# Patient Record
Sex: Female | Born: 1995 | Race: Black or African American | Hispanic: No | Smoking: Former smoker
Health system: Southern US, Community
[De-identification: ages and names within clinical notes are randomized; demographics above are authoritative.]

## PROBLEM LIST (undated history)

## (undated) ENCOUNTER — Inpatient Hospital Stay (HOSPITAL_COMMUNITY): Payer: Self-pay

## (undated) DIAGNOSIS — G4700 Insomnia, unspecified: Secondary | ICD-10-CM

## (undated) DIAGNOSIS — I499 Cardiac arrhythmia, unspecified: Secondary | ICD-10-CM

## (undated) DIAGNOSIS — J45909 Unspecified asthma, uncomplicated: Secondary | ICD-10-CM

## (undated) DIAGNOSIS — J302 Other seasonal allergic rhinitis: Secondary | ICD-10-CM

## (undated) DIAGNOSIS — F988 Other specified behavioral and emotional disorders with onset usually occurring in childhood and adolescence: Secondary | ICD-10-CM

## (undated) HISTORY — DX: Other specified behavioral and emotional disorders with onset usually occurring in childhood and adolescence: F98.8

## (undated) HISTORY — PX: TOOTH EXTRACTION: SUR596

## (undated) HISTORY — DX: Insomnia, unspecified: G47.00

## (undated) HISTORY — DX: Unspecified asthma, uncomplicated: J45.909

## (undated) HISTORY — DX: Other seasonal allergic rhinitis: J30.2

---
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
## 2014-03-17 ENCOUNTER — Inpatient Hospital Stay (HOSPITAL_COMMUNITY)
Admission: AD | Admit: 2014-03-17 | Discharge: 2014-03-17 | Disposition: A | Discharge status: Home / Self Care | Payer: Medicaid Other | Source: Ambulatory Visit | Attending: Obstetrics and Gynecology | Admitting: Obstetrics and Gynecology

## 2014-03-17 ENCOUNTER — Encounter (HOSPITAL_COMMUNITY): Payer: Self-pay | Admitting: *Deleted

## 2014-03-17 DIAGNOSIS — Z3201 Encounter for pregnancy test, result positive: Secondary | ICD-10-CM

## 2014-03-17 DIAGNOSIS — Z3200 Encounter for pregnancy test, result unknown: Secondary | ICD-10-CM | POA: Diagnosis present

## 2014-03-17 DIAGNOSIS — N926 Irregular menstruation, unspecified: Secondary | ICD-10-CM | POA: Insufficient documentation

## 2014-03-17 LAB — URINALYSIS, ROUTINE W REFLEX MICROSCOPIC
Bilirubin Urine: NEGATIVE
Glucose, UA: NEGATIVE mg/dL
Hgb urine dipstick: NEGATIVE
KETONES UR: NEGATIVE mg/dL
LEUKOCYTES UA: NEGATIVE
NITRITE: NEGATIVE
PH: 7 (ref 5.0–8.0)
PROTEIN: NEGATIVE mg/dL
Specific Gravity, Urine: 1.015 (ref 1.005–1.030)
UROBILINOGEN UA: 0.2 mg/dL (ref 0.0–1.0)

## 2014-03-17 LAB — POCT PREGNANCY, URINE: Preg Test, Ur: POSITIVE — AB

## 2014-03-17 NOTE — MAU Note (Signed)
Pt gave permission to discuss results and med  Hx in front of visitor

## 2014-03-17 NOTE — Discharge Instructions (Signed)
First Trimester of Pregnancy The first trimester of pregnancy is from week 1 until the end of week 12 (months 1 through 3). A week after a sperm fertilizes an egg, the egg will implant on the wall of the uterus. This embryo will begin to develop into a baby. Genes from you and your partner are forming the baby. The female genes determine whether the baby is a boy or a girl. At 6-8 weeks, the eyes and face are formed, and the heartbeat can be seen on ultrasound. At the end of 12 weeks, all the baby's organs are formed.  Now that you are pregnant, you will want to do everything you can to have a healthy baby. Two of the most important things are to get good prenatal care and to follow your health care provider's instructions. Prenatal care is all the medical care you receive before the baby's birth. This care will help prevent, find, and treat any problems during the pregnancy and childbirth. BODY CHANGES Your body goes through many changes during pregnancy. The changes vary from woman to woman.   You may gain or lose a couple of pounds at first.  You may feel sick to your stomach (nauseous) and throw up (vomit). If the vomiting is uncontrollable, call your health care provider.  You may tire easily.  You may develop headaches that can be relieved by medicines approved by your health care provider.  You may urinate more often. Painful urination may mean you have a bladder infection.  You may develop heartburn as a result of your pregnancy.  You may develop constipation because certain hormones are causing the muscles that push waste through your intestines to slow down.  You may develop hemorrhoids or swollen, bulging veins (varicose veins).  Your breasts may begin to grow larger and become tender. Your nipples may stick out more, and the tissue that surrounds them (areola) may become darker.  Your gums may bleed and may be sensitive to brushing and flossing.  Dark spots or blotches (chloasma,  mask of pregnancy) may develop on your face. This will likely fade after the baby is born.  Your menstrual periods will stop.  You may have a loss of appetite.  You may develop cravings for certain kinds of food.  You may have changes in your emotions from day to day, such as being excited to be pregnant or being concerned that something may go wrong with the pregnancy and baby.  You may have more vivid and strange dreams.  You may have changes in your hair. These can include thickening of your hair, rapid growth, and changes in texture. Some women also have hair loss during or after pregnancy, or hair that feels dry or thin. Your hair will most likely return to normal after your baby is born. WHAT TO EXPECT AT YOUR PRENATAL VISITS During a routine prenatal visit:  You will be weighed to make sure you and the baby are growing normally.  Your blood pressure will be taken.  Your abdomen will be measured to track your baby's growth.  The fetal heartbeat will be listened to starting around week 10 or 12 of your pregnancy.  Test results from any previous visits will be discussed. Your health care provider may ask you:  How you are feeling.  If you are feeling the baby move.  If you have had any abnormal symptoms, such as leaking fluid, bleeding, severe headaches, or abdominal cramping.  If you have any questions. Other tests   that may be performed during your first trimester include:  Blood tests to find your blood type and to check for the presence of any previous infections. They will also be used to check for low iron levels (anemia) and Rh antibodies. Later in the pregnancy, blood tests for diabetes will be done along with other tests if problems develop.  Urine tests to check for infections, diabetes, or protein in the urine.  An ultrasound to confirm the proper growth and development of the baby.  An amniocentesis to check for possible genetic problems.  Fetal screens for  spina bifida and Down syndrome.  You may need other tests to make sure you and the baby are doing well. HOME CARE INSTRUCTIONS  Medicines  Follow your health care provider's instructions regarding medicine use. Specific medicines may be either safe or unsafe to take during pregnancy.  Take your prenatal vitamins as directed.  If you develop constipation, try taking a stool softener if your health care provider approves. Diet  Eat regular, well-balanced meals. Choose a variety of foods, such as meat or vegetable-based protein, fish, milk and low-fat dairy products, vegetables, fruits, and whole grain breads and cereals. Your health care provider will help you determine the amount of weight gain that is right for you.  Avoid raw meat and uncooked cheese. These carry germs that can cause birth defects in the baby.  Eating four or five small meals rather than three large meals a day may help relieve nausea and vomiting. If you start to feel nauseous, eating a few soda crackers can be helpful. Drinking liquids between meals instead of during meals also seems to help nausea and vomiting.  If you develop constipation, eat more high-fiber foods, such as fresh vegetables or fruit and whole grains. Drink enough fluids to keep your urine clear or pale yellow. Activity and Exercise  Exercise only as directed by your health care provider. Exercising will help you:  Control your weight.  Stay in shape.  Be prepared for labor and delivery.  Experiencing pain or cramping in the lower abdomen or low back is a good sign that you should stop exercising. Check with your health care provider before continuing normal exercises.  Try to avoid standing for long periods of time. Move your legs often if you must stand in one place for a long time.  Avoid heavy lifting.  Wear low-heeled shoes, and practice good posture.  You may continue to have sex unless your health care provider directs you  otherwise. Relief of Pain or Discomfort  Wear a good support bra for breast tenderness.   Take warm sitz baths to soothe any pain or discomfort caused by hemorrhoids. Use hemorrhoid cream if your health care provider approves.   Rest with your legs elevated if you have leg cramps or low back pain.  If you develop varicose veins in your legs, wear support hose. Elevate your feet for 15 minutes, 3-4 times a day. Limit salt in your diet. Prenatal Care  Schedule your prenatal visits by the twelfth week of pregnancy. They are usually scheduled monthly at first, then more often in the last 2 months before delivery.  Write down your questions. Take them to your prenatal visits.  Keep all your prenatal visits as directed by your health care provider. Safety  Wear your seat belt at all times when driving.  Make a list of emergency phone numbers, including numbers for family, friends, the hospital, and police and fire departments. General Tips    Ask your health care provider for a referral to a local prenatal education class. Begin classes no later than at the beginning of month 6 of your pregnancy.  Ask for help if you have counseling or nutritional needs during pregnancy. Your health care provider can offer advice or refer you to specialists for help with various needs.  Do not use hot tubs, steam rooms, or saunas.  Do not douche or use tampons or scented sanitary pads.  Do not cross your legs for long periods of time.  Avoid cat litter boxes and soil used by cats. These carry germs that can cause birth defects in the baby and possibly loss of the fetus by miscarriage or stillbirth.  Avoid all smoking, herbs, alcohol, and medicines not prescribed by your health care provider. Chemicals in these affect the formation and growth of the baby.  Schedule a dentist appointment. At home, brush your teeth with a soft toothbrush and be gentle when you floss. SEEK MEDICAL CARE IF:   You have  dizziness.  You have mild pelvic cramps, pelvic pressure, or nagging pain in the abdominal area.  You have persistent nausea, vomiting, or diarrhea.  You have a bad smelling vaginal discharge.  You have pain with urination.  You notice increased swelling in your face, hands, legs, or ankles. SEEK IMMEDIATE MEDICAL CARE IF:   You have a fever.  You are leaking fluid from your vagina.  You have spotting or bleeding from your vagina.  You have severe abdominal cramping or pain.  You have rapid weight gain or loss.  You vomit blood or material that looks like coffee grounds.  You are exposed to German measles and have never had them.  You are exposed to fifth disease or chickenpox.  You develop a severe headache.  You have shortness of breath.  You have any kind of trauma, such as from a fall or a car accident. Document Released: 05/30/2001 Document Revised: 10/20/2013 Document Reviewed: 04/15/2013 [employer] Patient Information 2015 [employer]. This information is not intended to replace advice given to you by your health care provider. Make sure you discuss any questions you have with your health care provider.  

## 2014-03-17 NOTE — MAU Note (Signed)
Came to see if I was ok,  To see if pregnant.  Has had irregular periods, home test was "blank"

## 2014-03-17 NOTE — MAU Provider Note (Signed)
History     CSN: [id number]  Arrival date and time: 03/17/14 1224   First Provider Initiated Contact with Patient 03/17/14 1334      Chief Complaint  Patient presents with  . Possible Pregnancy   Possible Pregnancy   Nicole Dillon is a 18 y.o. G1P0 at Unknown who presents today for a pregnancy test. She states that she has irregular periods, and she tried to take a test at home. However, that test came up "blank". She denies any abdominal pain or vaginal bleeding.   No past medical history on file.  No past surgical history on file.  No family history on file.  History  Substance Use Topics  . Smoking status: Never Smoker   . Smokeless tobacco: Not on file  . Alcohol Use: No    Allergies: No Known Allergies  Prescriptions prior to admission  Medication Sig Dispense Refill  . cephALEXin (KEFLEX) 500 MG capsule Take 1 capsule (500 mg total) by mouth 2 (two) times daily.  20 capsule  0  . HYDROcodone-acetaminophen (NORCO/VICODIN) 5-325 MG per tablet Take 1 tablet by mouth every 4 (four) hours as needed for pain.  15 tablet  0  . ibuprofen (ADVIL,MOTRIN) 800 MG tablet Take 1 tablet (800 mg total) by mouth every 8 (eight) hours as needed for mild pain or moderate pain.  30 tablet  0    ROS Physical Exam   Blood pressure 125/77, pulse 112, temperature 98.7 F (37.1 C), temperature source Oral, resp. rate 18, height 5' 4.25" (1.632 m), weight 112.038 kg (247 lb), last menstrual period 01/31/2014, SpO2 100.00%.  Physical Exam  Nursing note and vitals reviewed. Constitutional: She is oriented to person, place, and time. She appears well-developed and well-nourished. No distress.  Cardiovascular: Normal rate.   Respiratory: Effort normal.  GI: Soft. There is no tenderness. There is no rebound.  Neurological: She is alert and oriented to person, place, and time.  Skin: Skin is warm and dry.  Psychiatric: She has a normal mood and affect.    MAU Course   Procedures  Results for orders placed during the hospital encounter of 03/17/14 (from the past 24 hour(s))  URINALYSIS, ROUTINE W REFLEX MICROSCOPIC     Status: Abnormal   Collection Time    03/17/14 12:33 PM      Result Value Ref Range   Color, Urine YELLOW  YELLOW   APPearance HAZY (*) CLEAR   Specific Gravity, Urine 1.015  1.005 - 1.030   pH 7.0  5.0 - 8.0   Glucose, UA NEGATIVE  NEGATIVE mg/dL   Hgb urine dipstick NEGATIVE  NEGATIVE   Bilirubin Urine NEGATIVE  NEGATIVE   Ketones, ur NEGATIVE  NEGATIVE mg/dL   Protein, ur NEGATIVE  NEGATIVE mg/dL   Urobilinogen, UA 0.2  0.0 - 1.0 mg/dL   Nitrite NEGATIVE  NEGATIVE   Leukocytes, UA NEGATIVE  NEGATIVE  POCT PREGNANCY, URINE     Status: Abnormal   Collection Time    03/17/14 12:44 PM      Result Value Ref Range   Preg Test, Ur POSITIVE (*) NEGATIVE     Assessment and Plan   1. Encounter for pregnancy test, result positive    First trimester precautions reviewed Return to MAU as needed [facility] as soon as possible  Follow-up Information   Follow up with [facility]. (Call for an appointment )    Specialty:  Obstetrics and Gynecology   Contact information:   [phone] [place] [id number] [phone]       Tawnya CrookHogan, Heather Donovan 03/17/2014, 1:36 PM

## 2014-03-22 NOTE — MAU Provider Note (Signed)
Attestation of Attending Supervision of Advanced Practitioner (CNM/NP): Evaluation and management procedures were performed by the Advanced Practitioner under my supervision and collaboration.  I have reviewed the Advanced Practitioner's note and chart, and I agree with the management and plan.  Hazem Kenner 03/22/2014 10:21 AM

## 2014-04-01 ENCOUNTER — Encounter: Payer: Self-pay | Admitting: Advanced Practice Midwife

## 2014-04-01 ENCOUNTER — Other Ambulatory Visit: Payer: Self-pay | Admitting: Advanced Practice Midwife

## 2014-04-01 ENCOUNTER — Encounter: Payer: Self-pay | Admitting: *Deleted

## 2014-04-01 ENCOUNTER — Ambulatory Visit (INDEPENDENT_AMBULATORY_CARE_PROVIDER_SITE_OTHER): Payer: Medicaid Other | Admitting: Advanced Practice Midwife

## 2014-04-01 ENCOUNTER — Other Ambulatory Visit: Payer: Self-pay | Admitting: Obstetrics & Gynecology

## 2014-04-01 VITALS — BP 109/77 | HR 94 | Wt 248.4 lb

## 2014-04-01 DIAGNOSIS — Z3682 Encounter for antenatal screening for nuchal translucency: Secondary | ICD-10-CM

## 2014-04-01 DIAGNOSIS — Z23 Encounter for immunization: Secondary | ICD-10-CM

## 2014-04-01 DIAGNOSIS — Z3401 Encounter for supervision of normal first pregnancy, first trimester: Secondary | ICD-10-CM

## 2014-04-01 DIAGNOSIS — Z3490 Encounter for supervision of normal pregnancy, unspecified, unspecified trimester: Secondary | ICD-10-CM | POA: Insufficient documentation

## 2014-04-01 DIAGNOSIS — O99211 Obesity complicating pregnancy, first trimester: Secondary | ICD-10-CM

## 2014-04-01 DIAGNOSIS — E669 Obesity, unspecified: Secondary | ICD-10-CM

## 2014-04-01 LAB — OB RESULTS CONSOLE GC/CHLAMYDIA
Chlamydia: NEGATIVE
Gonorrhea: NEGATIVE

## 2014-04-01 MED ORDER — NATACHEW 28-1 MG PO CHEW: 1.0000 | CHEWABLE_TABLET | Freq: Every day | ORAL | Status: DC

## 2014-04-01 MED ORDER — PROMETHAZINE HCL 25 MG PO TABS: 25.0000 mg | ORAL_TABLET | Freq: Four times a day (QID) | ORAL | Status: DC | PRN

## 2014-04-01 NOTE — Progress Notes (Signed)
New OB.  See Smart Set note.  Subjective:    Nicole Dillon is a G1P0 2719w4d being seen today for her first obstetrical visit.  Her obstetrical history is significant for obesity. Patient does intend to breast feed. Pregnancy history fully reviewed.  Patient reports nausea.  Filed Vitals:   04/01/14 1340  BP: 109/77  Pulse: 94  Weight: 248 lb 6.4 oz (112.674 kg)    HISTORY: OB History  Gravida Para Term Preterm AB SAB TAB Ectopic Multiple Living  1             # Outcome Date GA Lbr Len/2nd Weight Sex Delivery Anes PTL Lv  1 CUR              Past Medical History  Diagnosis Date  . 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activity   . Asthma   . Seasonal allergies   . Insomnia    History reviewed. No pertinent past surgical history. Family History  Problem Relation Age of Onset  . Diabetes Mother   . Diabetes Father   . Kidney disease Father     dialysis  . Learning disabilities Maternal Uncle     speach impediment  . Heart disease Maternal Grandmother   . Heart disease Maternal Grandfather      Exam    Uterus:     Pelvic Exam:    Perineum: No Hemorrhoids, Normal Perineum   Vulva: Bartholin's, Urethra, Skene's normal   Vagina:  normal mucosa, normal discharge   pH:    Cervix: no lesions and nulliparous appearance   Adnexa: normal adnexa and no mass, fullness, tenderness   Bony Pelvis: gynecoid  System: Breast:  normal appearance, no masses or tenderness   Skin: normal coloration and turgor, no rashes    Neurologic: oriented, normal, grossly non-focal   Extremities: normal strength, tone, and muscle mass   HEENT neck supple with midline trachea   Mouth/Teeth mucous membranes moist, pharynx normal without lesions   Neck supple and no masses   Cardiovascular: regular rate and rhythm, no murmurs or gallops   Respiratory:  appears well, vitals normal, no respiratory distress, acyanotic, normal RR, ear and throat exam is normal, neck free of mass or  lymphadenopathy, chest clear, no wheezing, crepitations, rhonchi, normal symmetric air entry   Abdomen: soft, non-tender; bowel sounds normal; no masses,  no organomegaly   Urinary: urethral meatus normal      Assessment:    Pregnancy: G1P0 There are no active problems to display for this patient.       Plan:     Initial labs drawn. Prenatal vitamins. Problem list reviewed and updated. Genetic Screening discussed First Screen: requested.  Ultrasound discussed; fetal survey: requested.  Follow up in 4 weeks. 50% of 30 min visit spent on counseling and coordination of care.   Discussed first trimester screen, does want to do it Mother states she had an amniocentesis because a cousin has a "learning disability"   [facility] HospitalWILLIAMS,Colinda Barth 04/01/2014

## 2014-04-01 NOTE — Patient Instructions (Signed)
AFP Maternal This is a routine screen (tests) used to check for fetal abnormalities such as Down syndrome and neural tube defects. Down syndrome is a chromosomal abnormality, sometimes called Trisomy 21. Neural tube defects are serious birth defects. The brain, spinal cord, or their coverings do not develop completely. Women should be tested in the 15th to 20th week of pregnancy. The msAFP screen involves three or four tests that measure substances found in the blood that make the testing better. During development, AFP levels in fetal blood and amniotic fluid rise until about 12 weeks. The levels then gradually fall until birth. AFP is a protein produce by fetal tissue. AFP crosses the placenta and appears in the maternal blood. A baby with an open neural tube defect has an opening in its spine, head, or abdominal wall that allows higher than usual amounts of AFP to pass into the mother's blood. If a screen is positive, more tests are needed to make a diagnosis. These include ultrasound and perhaps amniocentesis (checking the fluid that surrounds the baby). These tests are used to help women and their caregivers make decisions about the management of their pregnancies. In pregnancies where the fetus is carrying the chromosomal defect that results in Down syndrome, the levels of AFP and unconjugated estriol tend to be low and hCG and inhibin A levels high.  PREPARATION FOR TEST Blood is drawn from a vein in your arm usually between the 15th and 20th weeks of pregnancy. Four different tests on your blood are done. These are AFP, hCG, unconjugated estriol, and inhibin A. The combination of tests produces a more accurate result. NORMAL FINDINGS   Adult: less than 40 ng/mL or less than 40 mg/L (SI units).  Child younger than 1 year: less than 30 ng/mL. Ranges are stratified by weeks of gestation and vary among laboratories. Ranges for normal findings may vary among different laboratories and hospitals. You  should always check with your doctor after having lab work or other tests done to discuss the meaning of your test results and whether your values are considered within normal limits. MEANING OF TEST  These are screening tests. Not all fetal abnormalities will give positive test results. Of all women who have positive AFP screening results, only a very small number of them have babies who actually have a neural tube defect or chromosomal abnormality. Your caregiver will go over the test results with you and discuss the importance and meaning of your results, as well as treatment options and the need for additional tests if necessary. OBTAINING THE TEST RESULTS It is your responsibility to obtain your test results. Ask the lab or department performing the test when and how you will get your results. Document Released: 06/27/2004 Document Revised: 10/20/2013 Document Reviewed: 05/09/2008 ExitCare Patient Information 2015 [employer]. This information is not intended to replace advice given to you by your health care provider. Make sure you discuss any questions you have with your health care provider. First Trimester of Pregnancy The first trimester of pregnancy is from week 1 until the end of week 12 (months 1 through 3). A week after a sperm fertilizes an egg, the egg will implant on the wall of the uterus. This embryo will begin to develop into a baby. Genes from you and your partner are forming the baby. The female genes determine whether the baby is a boy or a girl. At 6-8 weeks, the eyes and face are formed, and the heartbeat can be seen on ultrasound.   At the end of 12 weeks, all the baby's organs are formed.  Now that you are pregnant, you will want to do everything you can to have a healthy baby. Two of the most important things are to get good prenatal care and to follow your health care provider's instructions. Prenatal care is all the medical care you receive before the baby's birth. This care  will help prevent, find, and treat any problems during the pregnancy and childbirth. BODY CHANGES Your body goes through many changes during pregnancy. The changes vary from woman to woman.   You may gain or lose a couple of pounds at first.  You may feel sick to your stomach (nauseous) and throw up (vomit). If the vomiting is uncontrollable, call your health care provider.  You may tire easily.  You may develop headaches that can be relieved by medicines approved by your health care provider.  You may urinate more often. Painful urination may mean you have a bladder infection.  You may develop heartburn as a result of your pregnancy.  You may develop constipation because certain hormones are causing the muscles that push waste through your intestines to slow down.  You may develop hemorrhoids or swollen, bulging veins (varicose veins).  Your breasts may begin to grow larger and become tender. Your nipples may stick out more, and the tissue that surrounds them (areola) may become darker.  Your gums may bleed and may be sensitive to brushing and flossing.  Dark spots or blotches (chloasma, mask of pregnancy) may develop on your face. This will likely fade after the baby is born.  Your menstrual periods will stop.  You may have a loss of appetite.  You may develop cravings for certain kinds of food.  You may have changes in your emotions from day to day, such as being excited to be pregnant or being concerned that something may go wrong with the pregnancy and baby.  You may have more vivid and strange dreams.  You may have changes in your hair. These can include thickening of your hair, rapid growth, and changes in texture. Some women also have hair loss during or after pregnancy, or hair that feels dry or thin. Your hair will most likely return to normal after your baby is born. WHAT TO EXPECT AT YOUR PRENATAL VISITS During a routine prenatal visit:  You will be weighed to make  sure you and the baby are growing normally.  Your blood pressure will be taken.  Your abdomen will be measured to track your baby's growth.  The fetal heartbeat will be listened to starting around week 10 or 12 of your pregnancy.  Test results from any previous visits will be discussed. Your health care provider may ask you:  How you are feeling.  If you are feeling the baby move.  If you have had any abnormal symptoms, such as leaking fluid, bleeding, severe headaches, or abdominal cramping.  If you have any questions. Other tests that may be performed during your first trimester include:  Blood tests to find your blood type and to check for the presence of any previous infections. They will also be used to check for low iron levels (anemia) and Rh antibodies. Later in the pregnancy, blood tests for diabetes will be done along with other tests if problems develop.  Urine tests to check for infections, diabetes, or protein in the urine.  An ultrasound to confirm the proper growth and development of the baby.  An amniocentesis   to check for possible genetic problems.  Fetal screens for spina bifida and Down syndrome.  You may need other tests to make sure you and the baby are doing well. HOME CARE INSTRUCTIONS  Medicines  Follow your health care provider's instructions regarding medicine use. Specific medicines may be either safe or unsafe to take during pregnancy.  Take your prenatal vitamins as directed.  If you develop constipation, try taking a stool softener if your health care provider approves. Diet  Eat regular, well-balanced meals. Choose a variety of foods, such as meat or vegetable-based protein, fish, milk and low-fat dairy products, vegetables, fruits, and whole grain breads and cereals. Your health care provider will help you determine the amount of weight gain that is right for you.  Avoid raw meat and uncooked cheese. These carry germs that can cause birth  defects in the baby.  Eating four or five small meals rather than three large meals a day may help relieve nausea and vomiting. If you start to feel nauseous, eating a few soda crackers can be helpful. Drinking liquids between meals instead of during meals also seems to help nausea and vomiting.  If you develop constipation, eat more high-fiber foods, such as fresh vegetables or fruit and whole grains. Drink enough fluids to keep your urine clear or pale yellow. Activity and Exercise  Exercise only as directed by your health care provider. Exercising will help you:  Control your weight.  Stay in shape.  Be prepared for labor and delivery.  Experiencing pain or cramping in the lower abdomen or low back is a good sign that you should stop exercising. Check with your health care provider before continuing normal exercises.  Try to avoid standing for long periods of time. Move your legs often if you must stand in one place for a long time.  Avoid heavy lifting.  Wear low-heeled shoes, and practice good posture.  You may continue to have sex unless your health care provider directs you otherwise. Relief of Pain or Discomfort  Wear a good support bra for breast tenderness.   Take warm sitz baths to soothe any pain or discomfort caused by hemorrhoids. Use hemorrhoid cream if your health care provider approves.   Rest with your legs elevated if you have leg cramps or low back pain.  If you develop varicose veins in your legs, wear support hose. Elevate your feet for 15 minutes, 3-4 times a day. Limit salt in your diet. Prenatal Care  Schedule your prenatal visits by the twelfth week of pregnancy. They are usually scheduled monthly at first, then more often in the last 2 months before delivery.  Write down your questions. Take them to your prenatal visits.  Keep all your prenatal visits as directed by your health care provider. Safety  Wear your seat belt at all times when  driving.  Make a list of emergency phone numbers, including numbers for family, friends, the hospital, and police and fire departments. General Tips  Ask your health care provider for a referral to a local prenatal education class. Begin classes no later than at the beginning of month 6 of your pregnancy.  Ask for help if you have counseling or nutritional needs during pregnancy. Your health care provider can offer advice or refer you to specialists for help with various needs.  Do not use hot tubs, steam rooms, or saunas.  Do not douche or use tampons or scented sanitary pads.  Do not cross your legs for long periods   of time.  Avoid cat litter boxes and soil used by cats. These carry germs that can cause birth defects in the baby and possibly loss of the fetus by miscarriage or stillbirth.  Avoid all smoking, herbs, alcohol, and medicines not prescribed by your health care provider. Chemicals in these affect the formation and growth of the baby.  Schedule a dentist appointment. At home, brush your teeth with a soft toothbrush and be gentle when you floss. SEEK MEDICAL CARE IF:   You have dizziness.  You have mild pelvic cramps, pelvic pressure, or nagging pain in the abdominal area.  You have persistent nausea, vomiting, or diarrhea.  You have a bad smelling vaginal discharge.  You have pain with urination.  You notice increased swelling in your face, hands, legs, or ankles. SEEK IMMEDIATE MEDICAL CARE IF:   You have a fever.  You are leaking fluid from your vagina.  You have spotting or bleeding from your vagina.  You have severe abdominal cramping or pain.  You have rapid weight gain or loss.  You vomit blood or material that looks like coffee grounds.  You are exposed to German measles and have never had them.  You are exposed to fifth disease or chickenpox.  You develop a severe headache.  You have shortness of breath.  You have any kind of trauma, such as  from a fall or a car accident. Document Released: 05/30/2001 Document Revised: 10/20/2013 Document Reviewed: 04/15/2013 Exit[employer] Patient Information 2015 [employer]. This information is not intended to replace advice given to you by your health care provider. Make sure you discuss any questions you have with your health care provider.  

## 2014-04-02 LAB — OBSTETRIC PANEL
Antibody Screen: NEGATIVE
BASOS PCT: 0 % (ref 0–1)
Basophils Absolute: 0 10*3/uL (ref 0.0–0.1)
EOS ABS: 0.1 10*3/uL (ref 0.0–0.7)
EOS PCT: 1 % (ref 0–5)
HCT: 36.4 % (ref 36.0–46.0)
HEP B S AG: NEGATIVE
Hemoglobin: 12.2 g/dL (ref 12.0–15.0)
Lymphocytes Relative: 27 % (ref 12–46)
Lymphs Abs: 2.6 10*3/uL (ref 0.7–4.0)
MCH: 29.4 pg (ref 26.0–34.0)
MCHC: 33.5 g/dL (ref 30.0–36.0)
MCV: 87.7 fL (ref 78.0–100.0)
Monocytes Absolute: 0.5 10*3/uL (ref 0.1–1.0)
Monocytes Relative: 5 % (ref 3–12)
NEUTROS PCT: 67 % (ref 43–77)
Neutro Abs: 6.6 10*3/uL (ref 1.7–7.7)
PLATELETS: 360 10*3/uL (ref 150–400)
RBC: 4.15 MIL/uL (ref 3.87–5.11)
RDW: 13.9 % (ref 11.5–15.5)
Rh Type: NEGATIVE
Rubella: 4.15 Index — ABNORMAL HIGH (ref <0.90)
WBC: 9.8 10*3/uL (ref 4.0–10.5)

## 2014-04-02 LAB — HIV ANTIBODY (ROUTINE TESTING W REFLEX): HIV: NONREACTIVE

## 2014-04-02 LAB — GC/CHLAMYDIA PROBE AMP
CT PROBE, AMP APTIMA: NEGATIVE
GC PROBE AMP APTIMA: NEGATIVE

## 2014-04-02 NOTE — Addendum Note (Signed)
Addended by: Barbara CowerNOGUES, Madissen Wyse L on: 04/02/2014 08:43 AM   Modules accepted: Orders

## 2014-04-03 LAB — CULTURE, OB URINE: Colony Count: >=100000

## 2014-04-08 ENCOUNTER — Encounter: Payer: Self-pay | Admitting: Advanced Practice Midwife

## 2014-04-08 DIAGNOSIS — Z6791 Unspecified blood type, Rh negative: Secondary | ICD-10-CM | POA: Insufficient documentation

## 2014-04-08 HISTORY — DX: Unspecified blood type, rh negative: Z67.91

## 2014-04-10 ENCOUNTER — Other Ambulatory Visit: Payer: Self-pay | Admitting: Obstetrics & Gynecology

## 2014-04-10 DIAGNOSIS — Z3682 Encounter for antenatal screening for nuchal translucency: Secondary | ICD-10-CM

## 2014-04-20 ENCOUNTER — Encounter: Payer: Self-pay | Admitting: Advanced Practice Midwife

## 2014-04-29 ENCOUNTER — Ambulatory Visit (HOSPITAL_COMMUNITY)
Admission: RE | Admit: 2014-04-29 | Discharge: 2014-04-29 | Disposition: A | Discharge status: Home / Self Care | Payer: Medicaid Other | Source: Ambulatory Visit | Attending: Obstetrics & Gynecology | Admitting: Obstetrics & Gynecology

## 2014-04-29 ENCOUNTER — Ambulatory Visit (INDEPENDENT_AMBULATORY_CARE_PROVIDER_SITE_OTHER): Payer: Medicaid Other | Admitting: Obstetrics & Gynecology

## 2014-04-29 VITALS — BP 118/73 | HR 148 | Wt 246.0 lb

## 2014-04-29 DIAGNOSIS — J45909 Unspecified asthma, uncomplicated: Secondary | ICD-10-CM | POA: Diagnosis not present

## 2014-04-29 DIAGNOSIS — O99511 Diseases of the respiratory system complicating pregnancy, first trimester: Secondary | ICD-10-CM | POA: Insufficient documentation

## 2014-04-29 DIAGNOSIS — O360111 Maternal care for anti-D [Rh] antibodies, first trimester, fetus 1: Secondary | ICD-10-CM

## 2014-04-29 DIAGNOSIS — Z3A12 12 weeks gestation of pregnancy: Secondary | ICD-10-CM | POA: Diagnosis not present

## 2014-04-29 DIAGNOSIS — O360910 Maternal care for other rhesus isoimmunization, first trimester, not applicable or unspecified: Secondary | ICD-10-CM | POA: Insufficient documentation

## 2014-04-29 DIAGNOSIS — Z3401 Encounter for supervision of normal first pregnancy, first trimester: Secondary | ICD-10-CM

## 2014-04-29 DIAGNOSIS — E669 Obesity, unspecified: Secondary | ICD-10-CM

## 2014-04-29 DIAGNOSIS — O99211 Obesity complicating pregnancy, first trimester: Secondary | ICD-10-CM

## 2014-04-29 DIAGNOSIS — Z3682 Encounter for antenatal screening for nuchal translucency: Secondary | ICD-10-CM

## 2014-04-29 NOTE — Patient Instructions (Signed)
Return to clinic for any obstetric concerns or go to MAU for evaluation  

## 2014-04-29 NOTE — Progress Notes (Signed)
First trimester screen later today, will check AFP only lab draw next visit.  Anatomy scan ordered.  No other complaints or concerns.  Routine obstetric precautions reviewed.

## 2014-05-06 ENCOUNTER — Other Ambulatory Visit (HOSPITAL_COMMUNITY): Payer: Self-pay

## 2014-05-27 ENCOUNTER — Encounter: Payer: Self-pay | Admitting: Obstetrics & Gynecology

## 2014-05-27 ENCOUNTER — Ambulatory Visit (INDEPENDENT_AMBULATORY_CARE_PROVIDER_SITE_OTHER): Payer: Medicaid Other | Admitting: Obstetrics & Gynecology

## 2014-05-27 VITALS — BP 118/91 | HR 109 | Wt 249.0 lb

## 2014-05-27 DIAGNOSIS — Z3402 Encounter for supervision of normal first pregnancy, second trimester: Secondary | ICD-10-CM

## 2014-05-27 DIAGNOSIS — Z36 Encounter for antenatal screening of mother: Secondary | ICD-10-CM

## 2014-05-27 DIAGNOSIS — O99212 Obesity complicating pregnancy, second trimester: Secondary | ICD-10-CM

## 2014-05-27 NOTE — Addendum Note (Signed)
Addended by: Barbara CowerNOGUES, Ammi Hutt L on: 05/27/2014 02:10 PM   Modules accepted: Orders

## 2014-05-27 NOTE — Patient Instructions (Signed)
Second Trimester of Pregnancy The second trimester is from week 13 through week 28, months 4 through 6. The second trimester is often a time when you feel your best. Your body has also adjusted to being pregnant, and you begin to feel better physically. Usually, morning sickness has lessened or quit completely, you may have more energy, and you may have an increase in appetite. The second trimester is also a time when the fetus is growing rapidly. At the end of the sixth month, the fetus is about 9 inches long and weighs about 1 pounds. You will likely begin to feel the baby move (quickening) between 18 and 20 weeks of the pregnancy. BODY CHANGES Your body goes through many changes during pregnancy. The changes vary from woman to woman.   Your weight will continue to increase. You will notice your lower abdomen bulging out.  You may begin to get stretch marks on your hips, abdomen, and breasts.  You may develop headaches that can be relieved by medicines approved by your health care provider.  You may urinate more often because the fetus is pressing on your bladder.  You may develop or continue to have heartburn as a result of your pregnancy.  You may develop constipation because certain hormones are causing the muscles that push waste through your intestines to slow down.  You may develop hemorrhoids or swollen, bulging veins (varicose veins).  You may have back pain because of the weight gain and pregnancy hormones relaxing your joints between the bones in your pelvis and as a result of a shift in weight and the muscles that support your balance.  Your breasts will continue to grow and be tender.  Your gums may bleed and may be sensitive to brushing and flossing.  Dark spots or blotches (chloasma, mask of pregnancy) may develop on your face. This will likely fade after the baby is born.  A dark line from your belly button to the pubic area (linea nigra) may appear. This will likely fade  after the baby is born.  You may have changes in your hair. These can include thickening of your hair, rapid growth, and changes in texture. Some women also have hair loss during or after pregnancy, or hair that feels dry or thin. Your hair will most likely return to normal after your baby is born. WHAT TO EXPECT AT YOUR PRENATAL VISITS During a routine prenatal visit:  You will be weighed to make sure you and the fetus are growing normally.  Your blood pressure will be taken.  Your abdomen will be measured to track your baby's growth.  The fetal heartbeat will be listened to.  Any test results from the previous visit will be discussed. Your health care provider may ask you:  How you are feeling.  If you are feeling the baby move.  If you have had any abnormal symptoms, such as leaking fluid, bleeding, severe headaches, or abdominal cramping.  If you have any questions. Other tests that may be performed during your second trimester include:  Blood tests that check for:  Low iron levels (anemia).  Gestational diabetes (between 24 and 28 weeks).  Rh antibodies.  Urine tests to check for infections, diabetes, or protein in the urine.  An ultrasound to confirm the proper growth and development of the baby.  An amniocentesis to check for possible genetic problems.  Fetal screens for spina bifida and Down syndrome. HOME CARE INSTRUCTIONS   Avoid all smoking, herbs, alcohol, and unprescribed   drugs. These chemicals affect the formation and growth of the baby.  Follow your health care provider's instructions regarding medicine use. There are medicines that are either safe or unsafe to take during pregnancy.  Exercise only as directed by your health care provider. Experiencing uterine cramps is a good sign to stop exercising.  Continue to eat regular, healthy meals.  Wear a good support bra for breast tenderness.  Do not use hot tubs, steam rooms, or saunas.  Wear your  seat belt at all times when driving.  Avoid raw meat, uncooked cheese, cat litter boxes, and soil used by cats. These carry germs that can cause birth defects in the baby.  Take your prenatal vitamins.  Try taking a stool softener (if your health care provider approves) if you develop constipation. Eat more high-fiber foods, such as fresh vegetables or fruit and whole grains. Drink plenty of fluids to keep your urine clear or pale yellow.  Take warm sitz baths to soothe any pain or discomfort caused by hemorrhoids. Use hemorrhoid cream if your health care provider approves.  If you develop varicose veins, wear support hose. Elevate your feet for 15 minutes, 3-4 times a day. Limit salt in your diet.  Avoid heavy lifting, wear low heel shoes, and practice good posture.  Rest with your legs elevated if you have leg cramps or low back pain.  Visit your dentist if you have not gone yet during your pregnancy. Use a soft toothbrush to brush your teeth and be gentle when you floss.  A sexual relationship may be continued unless your health care provider directs you otherwise.  Continue to go to all your prenatal visits as directed by your health care provider. SEEK MEDICAL CARE IF:   You have dizziness.  You have mild pelvic cramps, pelvic pressure, or nagging pain in the abdominal area.  You have persistent nausea, vomiting, or diarrhea.  You have a bad smelling vaginal discharge.  You have pain with urination. SEEK IMMEDIATE MEDICAL CARE IF:   You have a fever.  You are leaking fluid from your vagina.  You have spotting or bleeding from your vagina.  You have severe abdominal cramping or pain.  You have rapid weight gain or loss.  You have shortness of breath with chest pain.  You notice sudden or extreme swelling of your face, hands, ankles, feet, or legs.  You have not felt your baby move in over an hour.  You have severe headaches that do not go away with  medicine.  You have vision changes. Document Released: 05/30/2001 Document Revised: 06/10/2013 Document Reviewed: 08/06/2012 [employer] Patient Information 2015 [employer]. This information is not intended to replace advice given to you by your health care provider. Make sure you discuss any questions you have with your health care provider.  

## 2014-05-27 NOTE — Progress Notes (Signed)
AFP today. No problems noted F/u 4 weeks or sooner prn

## 2014-06-02 LAB — ALPHA FETOPROTEIN, MATERNAL
AFP: 27.5 ng/mL
CURR GEST AGE: 16.4 wks.days
MOM FOR AFP: 0.96
Open Spina bifida: NEGATIVE

## 2014-06-17 ENCOUNTER — Other Ambulatory Visit: Payer: Self-pay | Admitting: Obstetrics & Gynecology

## 2014-06-17 ENCOUNTER — Ambulatory Visit (HOSPITAL_COMMUNITY)
Admission: RE | Admit: 2014-06-17 | Discharge: 2014-06-17 | Disposition: A | Discharge status: Home / Self Care | Payer: Medicaid Other | Source: Ambulatory Visit | Attending: Obstetrics & Gynecology | Admitting: Obstetrics & Gynecology

## 2014-06-17 DIAGNOSIS — Z36 Encounter for antenatal screening of mother: Secondary | ICD-10-CM | POA: Insufficient documentation

## 2014-06-17 DIAGNOSIS — O360111 Maternal care for anti-D [Rh] antibodies, first trimester, fetus 1: Secondary | ICD-10-CM

## 2014-06-17 DIAGNOSIS — O99211 Obesity complicating pregnancy, first trimester: Secondary | ICD-10-CM

## 2014-06-17 DIAGNOSIS — O360920 Maternal care for other rhesus isoimmunization, second trimester, not applicable or unspecified: Secondary | ICD-10-CM | POA: Diagnosis present

## 2014-06-17 DIAGNOSIS — Z3A19 19 weeks gestation of pregnancy: Secondary | ICD-10-CM | POA: Diagnosis not present

## 2014-06-17 DIAGNOSIS — J45909 Unspecified asthma, uncomplicated: Secondary | ICD-10-CM | POA: Diagnosis not present

## 2014-06-17 DIAGNOSIS — O99212 Obesity complicating pregnancy, second trimester: Secondary | ICD-10-CM | POA: Diagnosis not present

## 2014-06-17 DIAGNOSIS — Z3689 Encounter for other specified antenatal screening: Secondary | ICD-10-CM | POA: Insufficient documentation

## 2014-06-17 DIAGNOSIS — Z3400 Encounter for supervision of normal first pregnancy, unspecified trimester: Secondary | ICD-10-CM | POA: Insufficient documentation

## 2014-06-17 DIAGNOSIS — O99512 Diseases of the respiratory system complicating pregnancy, second trimester: Secondary | ICD-10-CM | POA: Insufficient documentation

## 2014-06-18 ENCOUNTER — Encounter: Payer: Self-pay | Admitting: Obstetrics & Gynecology

## 2014-06-18 DIAGNOSIS — Z6841 Body Mass Index (BMI) 40.0 and over, adult: Secondary | ICD-10-CM | POA: Insufficient documentation

## 2014-06-18 HISTORY — DX: Morbid (severe) obesity due to excess calories: E66.01

## 2014-06-19 NOTE — L&D Delivery Note (Signed)
Delivery Note At 10:08 AM a viable female was delivered via Vaginal, Spontaneous Delivery (Presentation:vertex ; Occiput Anterior).  APGAR: 9, 9; weight  .   Placenta status: Intact, Spontaneous.  Cord: 3 vessels with the following complications: None.  Cord pH: n/a  Anesthesia: None  Episiotomy: None Lacerations: 2nd degree;Perineal Suture Repair: 2.0 vicryl Est. Blood Loss 250(mL):    Mom to postpartum.  Baby to Couplet care / Skin to Skin.  Nicole Dillon, Nicole Dillon 11/08/2014, 10:23 AM

## 2014-06-20 ENCOUNTER — Encounter (HOSPITAL_COMMUNITY): Payer: Self-pay

## 2014-06-20 ENCOUNTER — Inpatient Hospital Stay (HOSPITAL_COMMUNITY)
Admission: AD | Admit: 2014-06-20 | Discharge: 2014-06-20 | Disposition: A | Discharge status: Home / Self Care | Payer: Medicaid Other | Source: Ambulatory Visit | Attending: Obstetrics and Gynecology | Admitting: Obstetrics and Gynecology

## 2014-06-20 DIAGNOSIS — Z3A20 20 weeks gestation of pregnancy: Secondary | ICD-10-CM | POA: Diagnosis not present

## 2014-06-20 DIAGNOSIS — Z87891 Personal history of nicotine dependence: Secondary | ICD-10-CM | POA: Diagnosis not present

## 2014-06-20 DIAGNOSIS — O9989 Other specified diseases and conditions complicating pregnancy, childbirth and the puerperium: Secondary | ICD-10-CM | POA: Diagnosis not present

## 2014-06-20 DIAGNOSIS — O26892 Other specified pregnancy related conditions, second trimester: Secondary | ICD-10-CM

## 2014-06-20 DIAGNOSIS — N898 Other specified noninflammatory disorders of vagina: Secondary | ICD-10-CM | POA: Insufficient documentation

## 2014-06-20 NOTE — Discharge Instructions (Signed)
Call [facility] or go to [facility] if:  You begin to have strong, frequent contractions  Your water breaks.  Sometimes it is a big gush of fluid, sometimes it is just a trickle that keeps getting your panties wet or running down your legs  You have vaginal bleeding.  It is normal to have a small amount of spotting if your cervix was checked.   You don't feel your baby moving like normal.  If you don't, get you something to eat and drink and lay down and focus on feeling your baby move.  You should feel at least 10 movements in 2 hours.  If you don't, you should call the office or go to [facility].

## 2014-06-20 NOTE — MAU Provider Note (Signed)
  History  Chief Complaint:  Vaginal Discharge  Nicole Dillon is a 19 y.o. G1P0 female at [redacted]w[redacted]d presenting w/ report of leakage of clear fluid today. Did have sex recently.    Reports active fetal movement, contractions: none, vaginal bleeding: none, membranes: unsure. Denies uti s/s, abnormal/malodorous vag d/c or vulvovaginal itching/irritation.   Prenatal care at [facility].  Next visit 1/7. Pregnancy complicated by rh neg.   Obstetrical History: OB History    Gravida Para Term Preterm AB TAB SAB Ectopic Multiple Living   1               Past Medical History: Past Medical History  Diagnosis Date  . 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activity   . Asthma   . Seasonal allergies   . Insomnia     Past Surgical History: History reviewed. No pertinent past surgical history.  Social History: History   Social History  . Marital Status: Single    Spouse Name: N/A    Number of Children: N/A  . Years of Education: N/A   Social History Main Topics  . Smoking status: Former Games developer  . Smokeless tobacco: Never Used  . Alcohol Use: No  . Drug Use: No  . Sexual Activity: Yes   Other Topics Concern  . None   Social History Narrative    Allergies: No Known Allergies  No prescriptions prior to admission    Review of Systems  Pertinent pos/neg as indicated in HPI  Physical Exam  Blood pressure 111/64, pulse 109, temperature 98.7 F (37.1 C), temperature source Oral, resp. rate 16, height  (1.676 m), weight 113.762 kg (250 lb 12.8 oz), last menstrual period 01/31/2014. General appearance: alert, cooperative and no distress Lungs: clear to auscultation bilaterally, normal effort Heart: regular rate and rhythm Abdomen: gravid, soft, non-tender  Spec exam: cx visually closed, no pooling of fluid, no change w/ valsalva, scant nonodorous white d/c Cultures/Specimens: neg fern     Fetal monitoring: FHR:  via doppler by RN  MAU Course  SSE w/ neg pooling and neg  fern  Labs:  No results found for this or any previous visit (from the past 24 hour(s)).  Imaging:  n/a  Assessment and Plan  A:  [redacted]w[redacted]d SIUP  G1P0  Normal vaginal discharge  P:  D/C home  Reviewed ptl s/s, fm  Keep next appt at [facility] on 1/7 as scheduled   Marge Duncans CNM,WHNP-BC 1/2/20165:02 PM

## 2014-06-20 NOTE — MAU Note (Signed)
Pt presents complaining of a clear vaginal discharge that started this am. Not wearing pad. Denies pain or bleeding.

## 2014-06-25 ENCOUNTER — Encounter: Payer: Self-pay | Admitting: Obstetrics & Gynecology

## 2014-06-25 ENCOUNTER — Ambulatory Visit (INDEPENDENT_AMBULATORY_CARE_PROVIDER_SITE_OTHER): Payer: Medicaid Other | Admitting: Obstetrics & Gynecology

## 2014-06-25 VITALS — BP 116/71 | HR 105 | Wt 255.0 lb

## 2014-06-25 DIAGNOSIS — Z36 Encounter for antenatal screening of mother: Secondary | ICD-10-CM

## 2014-06-25 DIAGNOSIS — Z3482 Encounter for supervision of other normal pregnancy, second trimester: Secondary | ICD-10-CM

## 2014-06-25 NOTE — Progress Notes (Signed)
Her early glucola was lost so it will be re done this Monday.

## 2014-06-25 NOTE — Progress Notes (Signed)
Routine visit. Good FM. Denies problems. Schedule repeat u/s in about a month. Early glucola. Rec no more weight gain in this pregnancy.

## 2014-06-29 ENCOUNTER — Other Ambulatory Visit (INDEPENDENT_AMBULATORY_CARE_PROVIDER_SITE_OTHER): Payer: Medicaid Other | Admitting: *Deleted

## 2014-06-29 DIAGNOSIS — O99211 Obesity complicating pregnancy, first trimester: Secondary | ICD-10-CM

## 2014-06-29 DIAGNOSIS — Z36 Encounter for antenatal screening of mother: Secondary | ICD-10-CM

## 2014-06-29 NOTE — Progress Notes (Signed)
Patient is here today for early one hour glucose testing.

## 2014-06-30 LAB — GLUCOSE TOLERANCE, 1 HOUR (50G) W/O FASTING: Glucose, 1 Hour GTT: 105 mg/dL (ref 70–140)

## 2014-07-14 ENCOUNTER — Ambulatory Visit (HOSPITAL_COMMUNITY)
Admission: RE | Admit: 2014-07-14 | Discharge: 2014-07-14 | Disposition: A | Discharge status: Home / Self Care | Payer: Medicaid Other | Source: Ambulatory Visit | Attending: Obstetrics & Gynecology | Admitting: Obstetrics & Gynecology

## 2014-07-14 DIAGNOSIS — O360920 Maternal care for other rhesus isoimmunization, second trimester, not applicable or unspecified: Secondary | ICD-10-CM | POA: Diagnosis present

## 2014-07-14 DIAGNOSIS — Z3A23 23 weeks gestation of pregnancy: Secondary | ICD-10-CM | POA: Diagnosis not present

## 2014-07-14 DIAGNOSIS — IMO0002 Reserved for concepts with insufficient information to code with codable children: Secondary | ICD-10-CM | POA: Insufficient documentation

## 2014-07-14 DIAGNOSIS — Z36 Encounter for antenatal screening of mother: Secondary | ICD-10-CM | POA: Insufficient documentation

## 2014-07-14 DIAGNOSIS — O99212 Obesity complicating pregnancy, second trimester: Secondary | ICD-10-CM | POA: Diagnosis not present

## 2014-07-14 DIAGNOSIS — Z3482 Encounter for supervision of other normal pregnancy, second trimester: Secondary | ICD-10-CM

## 2014-07-14 DIAGNOSIS — Z0489 Encounter for examination and observation for other specified reasons: Secondary | ICD-10-CM | POA: Insufficient documentation

## 2014-07-23 ENCOUNTER — Encounter: Payer: Self-pay | Admitting: Obstetrics and Gynecology

## 2014-07-23 ENCOUNTER — Ambulatory Visit (INDEPENDENT_AMBULATORY_CARE_PROVIDER_SITE_OTHER): Payer: Medicaid Other | Admitting: Obstetrics and Gynecology

## 2014-07-23 VITALS — BP 112/92 | HR 72 | Wt 258.0 lb

## 2014-07-23 DIAGNOSIS — Z3402 Encounter for supervision of normal first pregnancy, second trimester: Secondary | ICD-10-CM

## 2014-07-23 DIAGNOSIS — O360121 Maternal care for anti-D [Rh] antibodies, second trimester, fetus 1: Secondary | ICD-10-CM

## 2014-07-23 DIAGNOSIS — E669 Obesity, unspecified: Secondary | ICD-10-CM

## 2014-07-23 DIAGNOSIS — O99212 Obesity complicating pregnancy, second trimester: Secondary | ICD-10-CM

## 2014-07-23 NOTE — Progress Notes (Signed)
Patient is doing well without complaints. FM/PTL precautions reviewed. Patient with URI. Advised lots of hydration and tylenol prn.

## 2014-08-26 ENCOUNTER — Encounter: Payer: Self-pay | Admitting: Obstetrics & Gynecology

## 2014-08-26 ENCOUNTER — Ambulatory Visit (INDEPENDENT_AMBULATORY_CARE_PROVIDER_SITE_OTHER): Payer: Medicaid Other | Admitting: Obstetrics & Gynecology

## 2014-08-26 VITALS — BP 111/91 | HR 136 | Wt 261.0 lb

## 2014-08-26 DIAGNOSIS — O99213 Obesity complicating pregnancy, third trimester: Secondary | ICD-10-CM

## 2014-08-26 DIAGNOSIS — Z3403 Encounter for supervision of normal first pregnancy, third trimester: Secondary | ICD-10-CM

## 2014-08-26 DIAGNOSIS — Z23 Encounter for immunization: Secondary | ICD-10-CM | POA: Diagnosis not present

## 2014-08-26 DIAGNOSIS — E669 Obesity, unspecified: Secondary | ICD-10-CM

## 2014-08-26 DIAGNOSIS — Z36 Encounter for antenatal screening of mother: Secondary | ICD-10-CM

## 2014-08-26 NOTE — Progress Notes (Signed)
Pt with no complaints. +FM, NO ctx, NO LOF.  Pt has not had ctx.  28 week labs today Needs sono to f/u fetal cardiac anatomy and size>dates

## 2014-08-26 NOTE — Patient Instructions (Signed)
Glucose Tolerance Test During Pregnancy The glucose tolerance test (GTT) or 3-hour glucose test can be used to determine if a woman has diabetes that first begins or is first recognized during pregnancy (gestational diabetes). Typically, a GTT is done after you have had a 1-hour glucose test with results that indicate you possibly have gestational diabetes.  The test takes about 3 hours. There will be a series of blood tests after you drink the sugar-water solution. You must remain at the testing location to make sure that your blood is drawn on time.  LET [facility]OUR [facility]ALTH CARE PROVIDER KNOW ABOUT:  Allergies to food or medicine.  Medicines taken, including vitamins, herbs, eyedrops, over-the-counter medicines, and creams.  Any recent illnesses or infections. BEFORE THE PROCEDURE The GTT is a fasting test, meaning you must stop eating for a certain amount of time. The test will be the most accurate if you have not eaten for 8-12 hours before the test. For this reason, it is recommended that you have this test done in the morning before you have breakfast. PROCEDURE  When you arrive at the lab, a sample of your blood is taken to get your fasting blood glucose level. After your fasting glucose level is determined, you will be given a sugar-water solution to drink. You will be asked to wait in a certain area until your next blood test. The blood tests are done each hour for 3 hours. Stay close to the lab so your blood samples can be taken on time. This is important. If the blood samples are not taken on time, the test will need to be done again on another day.  AFTER THE PROCEDURE  You can eat and drink as usual.   Ask when your test results will be ready. Make sure you get your test results. A positive test is considered when two of the four blood test values are equal or above the normal blood glucose level. Document Released: 12/05/2011 Document Revised: 10/20/2013 Document Reviewed:  10/10/2013 [facility] Patient Information 2015 [place] [place]. This information is not intended to replace advice given to you by your health care provider. Make sure you discuss any questions you have with your health care provider. Gestational Diabetes Mellitus Gestational diabetes mellitus, often simply referred to as gestational diabetes, is a type of diabetes that some women develop during pregnancy. In gestational diabetes, the pancreas does not make enough insulin (a hormone), the cells are less responsive to the insulin that is made (insulin resistance), or both.Normally, insulin moves sugars from food into the tissue cells. The tissue cells use the sugars for energy. The lack of insulin or the lack of normal response to insulin causes excess sugars to build up in the blood instead of going into the tissue cells. As a result, high blood sugar (hyperglycemia) develops. The effect of high sugar (glucose) levels can cause many problems.  RISK FACTORS You have an increased chance of developing gestational diabetes if you have a family history of diabetes and also have one or more of the following risk factors:  A body mass index over 30 (obesity).  A previous pregnancy with gestational diabetes.  An older age at the time of pregnancy. If blood glucose levels are kept in the normal range during pregnancy, women can have a healthy pregnancy. If your blood glucose levels are not well controlled, there may be risks to you, your unborn baby (fetus), your labor and delivery, or your newborn baby.  SYMPTOMS  If symptoms are  experienced, they are much like symptoms you would normally expect during pregnancy. The symptoms of gestational diabetes include:   Increased thirst (polydipsia).  Increased urination (polyuria).  Increased urination during the night (nocturia).  Weight loss. This weight loss may be rapid.  Frequent, recurring infections.  Tiredness (fatigue).  Weakness.  Vision  changes, such as blurred vision.  Fruity smell to your breath.  Abdominal pain. DIAGNOSIS Diabetes is diagnosed when blood glucose levels are increased. Your blood glucose level may be checked by one or more of the following blood tests:  A fasting blood glucose test. You will not be allowed to eat for at least 8 hours before a blood sample is taken.  A random blood glucose test. Your blood glucose is checked at any time of the day regardless of when you ate.  A hemoglobin A1c blood glucose test. A hemoglobin A1c test provides information about blood glucose control over the previous 3 months.  An oral glucose tolerance test (OGTT). Your blood glucose is measured after you have not eaten (fasted) for 1-3 hours and then after you drink a glucose-containing beverage. Since the hormones that cause insulin resistance are highest at about 24-28 weeks of a pregnancy, an OGTT is usually performed during that time. If you have risk factors for gestational diabetes, your health care provider may test you for gestational diabetes earlier than 24 weeks of pregnancy. TREATMENT   You will need to take diabetes medicine or insulin daily to keep blood glucose levels in the desired range.  You will need to match insulin dosing with exercise and healthy food choices. The treatment goal is to maintain the before-meal (preprandial), bedtime, and overnight blood glucose level at 60-99 mg/dL during pregnancy. The treatment goal is to further maintain peak after-meal blood sugar (postprandial glucose) level at 100-140 mg/dL. HOME CARE INSTRUCTIONS   Have your hemoglobin A1c level checked twice a year.  Perform daily blood glucose monitoring as directed by your health care provider. It is common to perform frequent blood glucose monitoring.  Monitor urine ketones when you are ill and as directed by your health care provider.  Take your diabetes medicine and insulin as directed by your health care provider to  maintain your blood glucose level in the desired range.  Never run out of diabetes medicine or insulin. It is needed every day.  Adjust insulin based on your intake of carbohydrates. Carbohydrates can raise blood glucose levels but need to be included in your diet. Carbohydrates provide vitamins, minerals, and fiber which are an essential part of a healthy diet. Carbohydrates are found in fruits, vegetables, whole grains, dairy products, legumes, and foods containing added sugars.  Eat healthy foods. Alternate 3 meals with 3 snacks.  Maintain a healthy weight gain. The usual total expected weight gain varies according to your prepregnancy body mass index (BMI).  Carry a medical alert card or wear your medical alert jewelry.  Carry a 15-gram carbohydrate snack with you at all times to treat low blood glucose (hypoglycemia). Some examples of 15-gram carbohydrate snacks include:  Glucose tablets, 3 or 4.  Glucose gel, 15-gram tube.  Raisins, 2 tablespoons (24 g).  Jelly beans, 6.  Animal crackers, 8.  Fruit juice, regular soda, or low-fat milk, 4 ounces (120 mL).  Gummy treats, 9.  Recognize hypoglycemia. Hypoglycemia during pregnancy occurs with blood glucose levels of 60 mg/dL and below. The risk for hypoglycemia increases when fasting or skipping meals, during or after intense exercise, and during sleep.  Hypoglycemia symptoms can include:  Tremors or shakes.  Decreased ability to concentrate.  Sweating.  Increased heart rate.  Headache.  Dry mouth.  Hunger.  Irritability.  Anxiety.  Restless sleep.  Altered speech or coordination.  Confusion.  Treat hypoglycemia promptly. If you are alert and able to safely swallow, follow the 15:15 rule:  Take 15-20 grams of rapid-acting glucose or carbohydrate. Rapid-acting options include glucose gel, glucose tablets, or 4 ounces (120 mL) of fruit juice, regular soda, or low-fat milk.  Check your blood glucose level 15  minutes after taking the glucose.  Take 15-20 grams more of glucose if the repeat blood glucose level is still 70 mg/dL or below.  Eat a meal or snack within 1 hour once blood glucose levels return to normal.  Be alert to polyuria (excess urination) and polydipsia (excess thirst) which are early signs of hyperglycemia. An early awareness of hyperglycemia allows for prompt treatment. Treat hyperglycemia as directed by your health care provider.  Engage in at least 30 minutes of physical activity a day or as directed by your health care provider. Ten minutes of physical activity timed 30 minutes after each meal is encouraged to control postprandial blood glucose levels.  Adjust your insulin dosing and food intake as needed if you start a new exercise or sport.  Follow your sick-day plan at any time you are unable to eat or drink as usual.  Avoid tobacco and alcohol use.  Keep all follow-up visits as directed by your health care provider.  Follow the advice of your health care provider regarding your prenatal and post-delivery (postpartum) appointments, meal planning, exercise, medicines, vitamins, blood tests, other medical tests, and physical activities.  Perform daily skin and foot care. Examine your skin and feet daily for cuts, bruises, redness, nail problems, bleeding, blisters, or sores.  Brush your teeth and gums at least twice a day and floss at least once a day. Follow up with your dentist regularly.  Schedule an eye exam during the first trimester of your pregnancy or as directed by your health care provider.  Share your diabetes management plan with your workplace or school.  Stay up-to-date with immunizations.  Learn to manage stress.  Obtain ongoing diabetes education and support as needed.  Learn about and consider breastfeeding your baby.  You should have your blood sugar level checked 6-12 weeks after delivery. This is done with an oral glucose tolerance test  (OGTT). SEEK MEDICAL CARE IF:   You are unable to eat food or drink fluids for more than 6 hours.  You have nausea and vomiting for more than 6 hours.  You have a blood glucose level of 200 mg/dL and you have ketones in your urine.  There is a change in mental status.  You develop vision problems.  You have a persistent headache.  You have upper abdominal pain or discomfort.  You develop an additional serious illness.  You have diarrhea for more than 6 hours.  You have been sick or have had a fever for a couple of days and are not getting better. SEEK IMMEDIATE MEDICAL CARE IF:   You have difficulty breathing.  You no longer feel the baby moving.  You are bleeding or have discharge from your vagina.  You start having premature contractions or labor. MAKE SURE YOU:  Understand these instructions.  Will watch your condition.  Will get help right away if you are not doing well or get worse. Document Released: 09/11/2000 Document Revised: 10/20/2013  Document Reviewed: 01/02/2012 [facility] Patient Information 2015 [place], [place]. This information is not intended to replace advice given to you by your health care provider. Make sure you discuss any questions you have with your health care provider.

## 2014-08-27 LAB — CBC WITH DIFFERENTIAL/PLATELET
BASOS PCT: 0 % (ref 0–1)
Basophils Absolute: 0 10*3/uL (ref 0.0–0.1)
EOS PCT: 1 % (ref 0–5)
Eosinophils Absolute: 0.1 10*3/uL (ref 0.0–0.7)
HEMATOCRIT: 34.9 % — AB (ref 36.0–46.0)
HEMOGLOBIN: 11.3 g/dL — AB (ref 12.0–15.0)
LYMPHS PCT: 17 % (ref 12–46)
Lymphs Abs: 1.7 10*3/uL (ref 0.7–4.0)
MCH: 29.7 pg (ref 26.0–34.0)
MCHC: 32.4 g/dL (ref 30.0–36.0)
MCV: 91.8 fL (ref 78.0–100.0)
MONO ABS: 0.5 10*3/uL (ref 0.1–1.0)
MONOS PCT: 5 % (ref 3–12)
MPV: 9.2 fL (ref 8.6–12.4)
NEUTROS PCT: 77 % (ref 43–77)
Neutro Abs: 7.6 10*3/uL (ref 1.7–7.7)
PLATELETS: 289 10*3/uL (ref 150–400)
RBC: 3.8 MIL/uL — ABNORMAL LOW (ref 3.87–5.11)
RDW: 13.5 % (ref 11.5–15.5)
WBC: 9.9 10*3/uL (ref 4.0–10.5)

## 2014-08-27 LAB — HIV ANTIBODY (ROUTINE TESTING W REFLEX): HIV 1&2 Ab, 4th Generation: NONREACTIVE

## 2014-08-27 LAB — GLUCOSE TOLERANCE, 1 HOUR (50G) W/O FASTING: Glucose, 1 Hour GTT: 85 mg/dL (ref 70–140)

## 2014-08-27 LAB — RPR

## 2014-08-31 ENCOUNTER — Ambulatory Visit (HOSPITAL_COMMUNITY)
Admission: RE | Admit: 2014-08-31 | Discharge: 2014-08-31 | Disposition: A | Discharge status: Home / Self Care | Payer: Medicaid Other | Source: Ambulatory Visit | Attending: Obstetrics & Gynecology | Admitting: Obstetrics & Gynecology

## 2014-08-31 DIAGNOSIS — Z3403 Encounter for supervision of normal first pregnancy, third trimester: Secondary | ICD-10-CM

## 2014-08-31 DIAGNOSIS — Z3A30 30 weeks gestation of pregnancy: Secondary | ICD-10-CM | POA: Diagnosis not present

## 2014-08-31 DIAGNOSIS — O26849 Uterine size-date discrepancy, unspecified trimester: Secondary | ICD-10-CM | POA: Insufficient documentation

## 2014-08-31 DIAGNOSIS — O26843 Uterine size-date discrepancy, third trimester: Secondary | ICD-10-CM | POA: Diagnosis present

## 2014-08-31 DIAGNOSIS — Z36 Encounter for antenatal screening of mother: Secondary | ICD-10-CM | POA: Insufficient documentation

## 2014-08-31 DIAGNOSIS — O99213 Obesity complicating pregnancy, third trimester: Secondary | ICD-10-CM | POA: Insufficient documentation

## 2014-09-09 ENCOUNTER — Ambulatory Visit (INDEPENDENT_AMBULATORY_CARE_PROVIDER_SITE_OTHER): Payer: Medicaid Other | Admitting: Obstetrics and Gynecology

## 2014-09-09 ENCOUNTER — Encounter: Payer: Self-pay | Admitting: Obstetrics and Gynecology

## 2014-09-09 VITALS — BP 128/79 | HR 139 | Wt 265.2 lb

## 2014-09-09 DIAGNOSIS — O99213 Obesity complicating pregnancy, third trimester: Secondary | ICD-10-CM

## 2014-09-09 DIAGNOSIS — E669 Obesity, unspecified: Secondary | ICD-10-CM

## 2014-09-09 DIAGNOSIS — O360131 Maternal care for anti-D [Rh] antibodies, third trimester, fetus 1: Secondary | ICD-10-CM

## 2014-09-09 DIAGNOSIS — Z3403 Encounter for supervision of normal first pregnancy, third trimester: Secondary | ICD-10-CM

## 2014-09-09 NOTE — Progress Notes (Signed)
Patient is doing well without complaints. FM/PTL precautions reviewed. Advised patient to find a pediatrician. Reviewed 3/14 ultrasound report with patient- no desire for repeat scan right now

## 2014-09-21 ENCOUNTER — Inpatient Hospital Stay (HOSPITAL_COMMUNITY)
Admission: AD | Admit: 2014-09-21 | Discharge: 2014-09-21 | Disposition: A | Discharge status: Home / Self Care | Payer: Medicaid Other | Source: Ambulatory Visit | Attending: Obstetrics & Gynecology | Admitting: Obstetrics & Gynecology

## 2014-09-21 ENCOUNTER — Encounter (HOSPITAL_COMMUNITY): Payer: Self-pay | Admitting: *Deleted

## 2014-09-21 DIAGNOSIS — Z87891 Personal history of nicotine dependence: Secondary | ICD-10-CM | POA: Diagnosis not present

## 2014-09-21 DIAGNOSIS — Z3A33 33 weeks gestation of pregnancy: Secondary | ICD-10-CM | POA: Diagnosis not present

## 2014-09-21 DIAGNOSIS — N949 Unspecified condition associated with female genital organs and menstrual cycle: Secondary | ICD-10-CM | POA: Diagnosis not present

## 2014-09-21 DIAGNOSIS — O9989 Other specified diseases and conditions complicating pregnancy, childbirth and the puerperium: Secondary | ICD-10-CM | POA: Insufficient documentation

## 2014-09-21 DIAGNOSIS — R102 Pelvic and perineal pain: Secondary | ICD-10-CM | POA: Diagnosis present

## 2014-09-21 LAB — URINALYSIS, ROUTINE W REFLEX MICROSCOPIC
Bilirubin Urine: NEGATIVE
Glucose, UA: NEGATIVE mg/dL
HGB URINE DIPSTICK: NEGATIVE
KETONES UR: NEGATIVE mg/dL
Nitrite: NEGATIVE
Protein, ur: NEGATIVE mg/dL
Specific Gravity, Urine: 1.02 (ref 1.005–1.030)
UROBILINOGEN UA: 0.2 mg/dL (ref 0.0–1.0)
pH: 6.5 (ref 5.0–8.0)

## 2014-09-21 LAB — URINE MICROSCOPIC-ADD ON

## 2014-09-21 LAB — WET PREP, GENITAL
Clue Cells Wet Prep HPF POC: NONE SEEN
Trich, Wet Prep: NONE SEEN
Yeast Wet Prep HPF POC: NONE SEEN

## 2014-09-21 NOTE — Discharge Instructions (Signed)
[facility].  This was consistent with round ligament pain.  This is normal in the third trimester.  Please obtain an abdominal binder.  Follow up with your OB as scheduled this Thursday.

## 2014-09-21 NOTE — MAU Note (Signed)
Pt presents to MAU with complaints of pelvic pressure. Denies any vaginal bleeding or LOF

## 2014-09-21 NOTE — MAU Provider Note (Signed)
History    CSN: [id number]  Arrival date and time: 09/21/14 1307   None    Chief Complaint  Patient presents with  . pelvic pressure    HPI  Patient reports pelvic pressure that has been present since early on in pregnancy.  She notes that discomfort has been worse since yesterday.  Patient describes sensation increased pain (squeezing in nature).  Pain does not radiate. No fevers, chills, N/V.  Patient eating and drinking well.  No pain with urination.  Denies constipation.  Denies LOF, bleeding.  Endorses good FM.  OB History    Gravida Para Term Preterm AB TAB SAB Ectopic Multiple Living   1               Past Medical History  Diagnosis Date  . 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activity   . Asthma   . Seasonal allergies   . Insomnia     History reviewed. No pertinent past surgical history.  Family History  Problem Relation Age of Onset  . Diabetes Mother   . Diabetes Father   . Kidney disease Father     dialysis  . Learning disabilities Maternal Uncle     speach impediment  . Heart disease Maternal Grandmother   . Heart disease Maternal Grandfather     History  Substance Use Topics  . Smoking status: Former Games developermoker  . Smokeless tobacco: Never Used  . Alcohol Use: No    Allergies: No Known Allergies  Prescriptions prior to admission  Medication Sig Dispense Refill Last Dose  . cetirizine (ZYRTEC) 10 MG tablet Take 10 mg by mouth daily as needed.   3 09/09/2014 at Unknown time  . Prenatal Vit-Fe Fumarate-FA (PRENATAL MULTIVITAMIN) TABS tablet Take 1 tablet by mouth daily.   09/20/2014 at Unknown time  . albuterol (PROAIR HFA) 108 (90 BASE) MCG/ACT inhaler Inhale 1 puff into the lungs every 6 (six) hours as needed for wheezing or shortness of breath.   emergency    Review of Systems  Constitutional: Negative for fever and chills.  Gastrointestinal: Negative for vomiting, diarrhea and constipation.  Genitourinary: Negative for dysuria and urgency.    Physical Exam   Last menstrual period 01/31/2014.  Physical Exam  Constitutional: She is oriented to person, place, and time. She appears well-developed and well-nourished. No distress.  HENT:  Head: Normocephalic and atraumatic.  Eyes: EOM are normal.  Neck: Normal range of motion.  Respiratory: Effort normal.  Genitourinary: Uterus normal. Cervix exhibits discharge. Cervix exhibits no friability. No tenderness in the vagina. Vaginal discharge found.  Neurological: She is alert and oriented to person, place, and time.  Skin: Skin is warm and dry.  Psychiatric: She has a normal mood and affect. Her behavior is normal.   Fetal monitoring Baseline: 145 bpm and Variability: Good {> 6 bpm) Uterine activityFrequency: irritability with rare contractions  Urinalysis    Component Value Date/Time   COLORURINE YELLOW 09/21/2014 1350   APPEARANCEUR CLOUDY* 09/21/2014 1350   LABSPEC 1.020 09/21/2014 1350   PHURINE 6.5 09/21/2014 1350   GLUCOSEU NEGATIVE 09/21/2014 1350   HGBUR NEGATIVE 09/21/2014 1350   BILIRUBINUR NEGATIVE 09/21/2014 1350   KETONESUR NEGATIVE 09/21/2014 1350   PROTEINUR NEGATIVE 09/21/2014 1350   UROBILINOGEN 0.2 09/21/2014 1350   NITRITE NEGATIVE 09/21/2014 1350   LEUKOCYTESUR TRACE* 09/21/2014 1350    MAU Course  Procedures  MDM Urine with trace leukocytes and few bacteria.  H&P consistent with round ligament pain.  Wet prep with WBCs.  Urine culture, GC/GT obtained. NST reactive SVE cl/th/hi  Assessment and Plan  Discomfort likely 2/2 round ligament pain.  UA with few bacteria.  Urine culture, GC/CT ordered.  Patient already has an appointment with her OB scheduled for Thursday.  Patient to use abdominal support.  Return precautions reviewed with patient.   Delynn Flavin M, DO 09/21/2014, 2:50 PM    OB fellow attestation:  I have seen and examined this patient; I agree with above documentation in the resident's note.   Ylianna Almanzar is a 18 y.o.  G1P0 reporting groin pain.  +FM, denies LOF, VB, contractions, vaginal discharge.  PE: BP 122/82 mmHg  Pulse 129  LMP 01/31/2014 Gen: calm comfortable, NAD Resp: normal effort, no distress Abd: gravid  ROS, labs, PMH reviewed NST reactive   Plan: #Round Ligament Pain - no evidence of preterm labor - wet prep negative - GC/CT, urine culture pending - fetal kick counts reinforced, labor precautions given - recommend abdominal binder - continue routine follow up in OB clinic  William Dalton, MD 6:13 PM

## 2014-09-21 NOTE — MAU Note (Signed)
Pt presents to MAU with complaints of pelvic pressure for weeks but states it has gotten worse throughout the night. Denies any vaginal bleeding or LOF

## 2014-09-22 LAB — GC/CHLAMYDIA PROBE AMP (~~LOC~~) NOT AT ARMC
CHLAMYDIA, DNA PROBE: NEGATIVE
Neisseria Gonorrhea: NEGATIVE

## 2014-09-22 LAB — CULTURE, OB URINE
Colony Count: 30000
SPECIAL REQUESTS: NORMAL

## 2014-09-24 ENCOUNTER — Ambulatory Visit (INDEPENDENT_AMBULATORY_CARE_PROVIDER_SITE_OTHER): Payer: Medicaid Other | Admitting: Family Medicine

## 2014-09-24 VITALS — BP 120/79 | HR 160

## 2014-09-24 DIAGNOSIS — Z3403 Encounter for supervision of normal first pregnancy, third trimester: Secondary | ICD-10-CM

## 2014-09-24 NOTE — Patient Instructions (Signed)
Breastfeeding Deciding to breastfeed is one of the best choices you can make for you and your baby. A change in hormones during pregnancy causes your breast tissue to grow and increases the number and size of your milk ducts. These hormones also allow proteins, sugars, and fats from your blood supply to make breast milk in your milk-producing glands. Hormones prevent breast milk from being released before your baby is born as well as prompt milk flow after birth. Once breastfeeding has begun, thoughts of your baby, as well as his or her sucking or crying, can stimulate the release of milk from your milk-producing glands.  BENEFITS OF BREASTFEEDING For Your Baby  Your first milk (colostrum) helps your baby's digestive system function better.   There are antibodies in your milk that help your baby fight off infections.   Your baby has a lower incidence of asthma, allergies, and sudden infant death syndrome.   The nutrients in breast milk are better for your baby than infant formulas and are designed uniquely for your baby's needs.   Breast milk improves your baby's brain development.   Your baby is less likely to develop other conditions, such as childhood obesity, asthma, or type 2 diabetes mellitus.  For You   Breastfeeding helps to create a very special bond between you and your baby.   Breastfeeding is convenient. Breast milk is always available at the correct temperature and costs nothing.   Breastfeeding helps to burn calories and helps you lose the weight gained during pregnancy.   Breastfeeding makes your uterus contract to its prepregnancy size faster and slows bleeding (lochia) after you give birth.   Breastfeeding helps to lower your risk of developing type 2 diabetes mellitus, osteoporosis, and breast or ovarian cancer later in life. SIGNS THAT YOUR BABY IS HUNGRY Early Signs of Hunger  Increased alertness or activity.  Stretching.  Movement of the head from  side to side.  Movement of the head and opening of the mouth when the corner of the mouth or cheek is stroked (rooting).  Increased sucking sounds, smacking lips, cooing, sighing, or squeaking.  Hand-to-mouth movements.  Increased sucking of fingers or hands. Late Signs of Hunger  Fussing.  Intermittent crying. Extreme Signs of Hunger Signs of extreme hunger will require calming and consoling before your baby will be able to breastfeed successfully. Do not wait for the following signs of extreme hunger to occur before you initiate breastfeeding:   Restlessness.  A loud, strong cry.   Screaming. BREASTFEEDING BASICS Breastfeeding Initiation  Find a comfortable place to sit or lie down, with your neck and back well supported.  Place a pillow or rolled up blanket under your baby to bring him or her to the level of your breast (if you are seated). Nursing pillows are specially designed to help support your arms and your baby while you breastfeed.  Make sure that your baby's abdomen is facing your abdomen.   Gently massage your breast. With your fingertips, massage from your chest wall toward your nipple in a circular motion. This encourages milk flow. You may need to continue this action during the feeding if your milk flows slowly.  Support your breast with 4 fingers underneath and your thumb above your nipple. Make sure your fingers are well away from your nipple and your baby's mouth.   Stroke your baby's lips gently with your finger or nipple.   When your baby's mouth is open wide enough, quickly bring your baby to your   breast, placing your entire nipple and as much of the colored area around your nipple (areola) as possible into your baby's mouth.   More areola should be visible above your baby's upper lip than below the lower lip.   Your baby's tongue should be between his or her lower gum and your breast.   Ensure that your baby's mouth is correctly positioned  around your nipple (latched). Your baby's lips should create a seal on your breast and be turned out (everted).  It is common for your baby to suck about 2-3 minutes in order to start the flow of breast milk. Latching Teaching your baby how to latch on to your breast properly is very important. An improper latch can cause nipple pain and decreased milk supply for you and poor weight gain in your baby. Also, if your baby is not latched onto your nipple properly, he or she may swallow some air during feeding. This can make your baby fussy. Burping your baby when you switch breasts during the feeding can help to get rid of the air. However, teaching your baby to latch on properly is still the best way to prevent fussiness from swallowing air while breastfeeding. Signs that your baby has successfully latched on to your nipple:    Silent tugging or silent sucking, without causing you pain.   Swallowing heard between every 3-4 sucks.    Muscle movement above and in front of his or her ears while sucking.  Signs that your baby has not successfully latched on to nipple:   Sucking sounds or smacking sounds from your baby while breastfeeding.  Nipple pain. If you think your baby has not latched on correctly, slip your finger into the corner of your baby's mouth to break the suction and place it between your baby's gums. Attempt breastfeeding initiation again. Signs of Successful Breastfeeding Signs from your baby:   A gradual decrease in the number of sucks or complete cessation of sucking.   Falling asleep.   Relaxation of his or her body.   Retention of a small amount of milk in his or her mouth.   Letting go of your breast by himself or herself. Signs from you:  Breasts that have increased in firmness, weight, and size 1-3 hours after feeding.   Breasts that are softer immediately after breastfeeding.  Increased milk volume, as well as a change in milk consistency and color by  the fifth day of breastfeeding.   Nipples that are not sore, cracked, or bleeding. Signs That Your Baby is Getting Enough Milk  Wetting at least 3 diapers in a 24-hour period. The urine should be clear and pale yellow by age 5 days.  At least 3 stools in a 24-hour period by age 5 days. The stool should be soft and yellow.  At least 3 stools in a 24-hour period by age 7 days. The stool should be seedy and yellow.  No loss of weight greater than 10% of birth weight during the first 3 days of age.  Average weight gain of 4-7 ounces (113-198 g) per week after age 4 days.  Consistent daily weight gain by age 5 days, without weight loss after the age of 2 weeks. After a feeding, your baby may spit up a small amount. This is common. BREASTFEEDING FREQUENCY AND DURATION Frequent feeding will help you make more milk and can prevent sore nipples and breast engorgement. Breastfeed when you feel the need to reduce the fullness of your breasts   or when your baby shows signs of hunger. This is called "breastfeeding on demand." Avoid introducing a pacifier to your baby while you are working to establish breastfeeding (the first 4-6 weeks after your baby is born). After this time you may choose to use a pacifier. Research has shown that pacifier use during the first year of a baby's life decreases the risk of sudden infant death syndrome (SIDS). Allow your baby to feed on each breast as long as he or she wants. Breastfeed until your baby is finished feeding. When your baby unlatches or falls asleep while feeding from the first breast, offer the second breast. Because newborns are often sleepy in the first few weeks of life, you may need to awaken your baby to get him or her to feed. Breastfeeding times will vary from baby to baby. However, the following rules can serve as a guide to help you ensure that your baby is properly fed:  Newborns (babies 4 weeks of age or younger) may breastfeed every 1-3  hours.  Newborns should not go longer than 3 hours during the day or 5 hours during the night without breastfeeding.  You should breastfeed your baby a minimum of 8 times in a 24-hour period until you begin to introduce solid foods to your baby at around 6 months of age. BREAST MILK PUMPING Pumping and storing breast milk allows you to ensure that your baby is exclusively fed your breast milk, even at times when you are unable to breastfeed. This is especially important if you are going back to work while you are still breastfeeding or when you are not able to be present during feedings. Your lactation consultant can give you guidelines on how long it is safe to store breast milk.  A breast pump is a machine that allows you to pump milk from your breast into a sterile bottle. The pumped breast milk can then be stored in a refrigerator or freezer. Some breast pumps are operated by hand, while others use electricity. Ask your lactation consultant which type will work best for you. Breast pumps can be purchased, but some hospitals and breastfeeding support groups lease breast pumps on a monthly basis. A lactation consultant can teach you how to hand express breast milk, if you prefer not to use a pump.  CARING FOR YOUR BREASTS WHILE YOU BREASTFEED Nipples can become dry, cracked, and sore while breastfeeding. The following recommendations can help keep your breasts moisturized and healthy:  Avoid using soap on your nipples.   Wear a supportive bra. Although not required, special nursing bras and tank tops are designed to allow access to your breasts for breastfeeding without taking off your entire bra or top. Avoid wearing underwire-style bras or extremely tight bras.  Air dry your nipples for 3-4minutes after each feeding.   Use only cotton bra pads to absorb leaked breast milk. Leaking of breast milk between feedings is normal.   Use lanolin on your nipples after breastfeeding. Lanolin helps to  maintain your skin's normal moisture barrier. If you use pure lanolin, you do not need to wash it off before feeding your baby again. Pure lanolin is not toxic to your baby. You may also hand express a few drops of breast milk and gently massage that milk into your nipples and allow the milk to air dry. In the first few weeks after giving birth, some women experience extremely full breasts (engorgement). Engorgement can make your breasts feel heavy, warm, and tender to the   touch. Engorgement peaks within 3-5 days after you give birth. The following recommendations can help ease engorgement:  Completely empty your breasts while breastfeeding or pumping. You may want to start by applying warm, moist heat (in the shower or with warm water-soaked hand towels) just before feeding or pumping. This increases circulation and helps the milk flow. If your baby does not completely empty your breasts while breastfeeding, pump any extra milk after he or she is finished.  Wear a snug bra (nursing or regular) or tank top for 1-2 days to signal your body to slightly decrease milk production.  Apply ice packs to your breasts, unless this is too uncomfortable for you.  Make sure that your baby is latched on and positioned properly while breastfeeding. If engorgement persists after 48 hours of following these recommendations, contact your health care provider or a lactation consultant. OVERALL HEALTH CARE RECOMMENDATIONS WHILE BREASTFEEDING  Eat healthy foods. Alternate between meals and snacks, eating 3 of each per day. Because what you eat affects your breast milk, some of the foods may make your baby more irritable than usual. Avoid eating these foods if you are sure that they are negatively affecting your baby.  Drink milk, fruit juice, and water to satisfy your thirst (about 10 glasses a day).   Rest often, relax, and continue to take your prenatal vitamins to prevent fatigue, stress, and anemia.  Continue  breast self-awareness checks.  Avoid chewing and smoking tobacco.  Avoid alcohol and drug use. Some medicines that may be harmful to your baby can pass through breast milk. It is important to ask your health care provider before taking any medicine, including all over-the-counter and prescription medicine as well as vitamin and herbal supplements. It is possible to become pregnant while breastfeeding. If birth control is desired, ask your health care provider about options that will be safe for your baby. SEEK MEDICAL CARE IF:   You feel like you want to stop breastfeeding or have become frustrated with breastfeeding.  You have painful breasts or nipples.  Your nipples are cracked or bleeding.  Your breasts are red, tender, or warm.  You have a swollen area on either breast.  You have a fever or chills.  You have nausea or vomiting.  You have drainage other than breast milk from your nipples.  Your breasts do not become full before feedings by the fifth day after you give birth.  You feel sad and depressed.  Your baby is too sleepy to eat well.  Your baby is having trouble sleeping.   Your baby is wetting less than 3 diapers in a 24-hour period.  Your baby has less than 3 stools in a 24-hour period.  Your baby's skin or the white part of his or her eyes becomes yellow.   Your baby is not gaining weight by 5 days of age. SEEK IMMEDIATE MEDICAL CARE IF:   Your baby is overly tired (lethargic) and does not want to wake up and feed.  Your baby develops an unexplained fever. Document Released: 06/05/2005 Document Revised: 06/10/2013 Document Reviewed: 11/27/2012 Exit[employer] Patient Information 2015 [employer]. This information is not intended to replace advice given to you by your health care provider. Make sure you discuss any questions you have with your health care provider.  

## 2014-09-24 NOTE — Progress Notes (Signed)
U/S showed normal weight C/o LOF-Neg pool, neg fern, neg nitrazine. Wet prep and GC/Chlam are negative from 4/4

## 2014-09-24 NOTE — Progress Notes (Signed)
Thinks she might be leaking fluid.

## 2014-10-08 ENCOUNTER — Ambulatory Visit (INDEPENDENT_AMBULATORY_CARE_PROVIDER_SITE_OTHER): Payer: Medicaid Other | Admitting: Obstetrics and Gynecology

## 2014-10-08 ENCOUNTER — Encounter: Payer: Self-pay | Admitting: Obstetrics and Gynecology

## 2014-10-08 VITALS — BP 104/73 | HR 147 | Wt 269.0 lb

## 2014-10-08 DIAGNOSIS — O360131 Maternal care for anti-D [Rh] antibodies, third trimester, fetus 1: Secondary | ICD-10-CM

## 2014-10-08 DIAGNOSIS — Z36 Encounter for antenatal screening of mother: Secondary | ICD-10-CM | POA: Diagnosis not present

## 2014-10-08 DIAGNOSIS — E669 Obesity, unspecified: Secondary | ICD-10-CM

## 2014-10-08 DIAGNOSIS — Z3403 Encounter for supervision of normal first pregnancy, third trimester: Secondary | ICD-10-CM

## 2014-10-08 DIAGNOSIS — O99213 Obesity complicating pregnancy, third trimester: Secondary | ICD-10-CM

## 2014-10-08 LAB — OB RESULTS CONSOLE GBS: GBS: NEGATIVE

## 2014-10-08 NOTE — Progress Notes (Signed)
Patient is doing well without complaints. FM/PTL precautions reviewed. GBS culture collected today.

## 2014-10-10 LAB — CULTURE, BETA STREP (GROUP B ONLY)

## 2014-10-15 ENCOUNTER — Ambulatory Visit (INDEPENDENT_AMBULATORY_CARE_PROVIDER_SITE_OTHER): Payer: Medicaid Other | Admitting: Obstetrics & Gynecology

## 2014-10-15 VITALS — BP 130/85 | HR 140 | Wt 270.0 lb

## 2014-10-15 DIAGNOSIS — Z3403 Encounter for supervision of normal first pregnancy, third trimester: Secondary | ICD-10-CM

## 2014-10-15 NOTE — Progress Notes (Signed)
Negative pelvic cultures from last week.  No other complaints or concerns.  Labor and fetal movement precautions reviewed.

## 2014-10-15 NOTE — Patient Instructions (Signed)
Return to clinic for any obstetric concerns or go to MAU for evaluation  

## 2014-10-18 ENCOUNTER — Inpatient Hospital Stay (HOSPITAL_COMMUNITY)
Admission: AD | Admit: 2014-10-18 | Discharge: 2014-10-18 | Disposition: A | Discharge status: Home / Self Care | Payer: Medicaid Other | Source: Ambulatory Visit | Attending: Family Medicine | Admitting: Family Medicine

## 2014-10-18 ENCOUNTER — Encounter (HOSPITAL_COMMUNITY): Payer: Self-pay | Admitting: *Deleted

## 2014-10-18 DIAGNOSIS — Z3A37 37 weeks gestation of pregnancy: Secondary | ICD-10-CM | POA: Insufficient documentation

## 2014-10-18 DIAGNOSIS — O368390 Maternal care for abnormalities of the fetal heart rate or rhythm, unspecified trimester, not applicable or unspecified: Secondary | ICD-10-CM

## 2014-10-18 DIAGNOSIS — O99213 Obesity complicating pregnancy, third trimester: Secondary | ICD-10-CM

## 2014-10-18 DIAGNOSIS — Z3403 Encounter for supervision of normal first pregnancy, third trimester: Secondary | ICD-10-CM

## 2014-10-18 NOTE — MAU Note (Signed)
Pt discharged home . After further reviewing EFM last 10 min of strip with Dr. Loreta AveAcosta wanted me to call pt back to get a BPP. Attempted to call pt Phone not in service. Attempted to call pt mothers phone and it was not in service either. Notified Dr. Loreta AveAcosta. Will continue to try to contact pt.

## 2014-10-18 NOTE — MAU Note (Signed)
Back pain all day. Contractions since 1700. Denies bleeding or LOF

## 2014-10-21 ENCOUNTER — Inpatient Hospital Stay (HOSPITAL_COMMUNITY)
Admission: AD | Admit: 2014-10-21 | Discharge: 2014-10-21 | Disposition: A | Discharge status: Home / Self Care | Payer: Medicaid Other | Source: Ambulatory Visit | Attending: Obstetrics and Gynecology | Admitting: Obstetrics and Gynecology

## 2014-10-21 ENCOUNTER — Encounter (HOSPITAL_COMMUNITY): Payer: Self-pay | Admitting: *Deleted

## 2014-10-21 DIAGNOSIS — O212 Late vomiting of pregnancy: Secondary | ICD-10-CM | POA: Insufficient documentation

## 2014-10-21 DIAGNOSIS — Z87891 Personal history of nicotine dependence: Secondary | ICD-10-CM | POA: Insufficient documentation

## 2014-10-21 DIAGNOSIS — Z3A37 37 weeks gestation of pregnancy: Secondary | ICD-10-CM | POA: Diagnosis not present

## 2014-10-21 DIAGNOSIS — O9989 Other specified diseases and conditions complicating pregnancy, childbirth and the puerperium: Secondary | ICD-10-CM | POA: Diagnosis not present

## 2014-10-21 DIAGNOSIS — O26893 Other specified pregnancy related conditions, third trimester: Secondary | ICD-10-CM

## 2014-10-21 DIAGNOSIS — R519 Headache, unspecified: Secondary | ICD-10-CM

## 2014-10-21 DIAGNOSIS — O219 Vomiting of pregnancy, unspecified: Secondary | ICD-10-CM | POA: Diagnosis not present

## 2014-10-21 DIAGNOSIS — R51 Headache: Secondary | ICD-10-CM | POA: Diagnosis present

## 2014-10-21 HISTORY — DX: Cardiac arrhythmia, unspecified: I49.9

## 2014-10-21 LAB — URINALYSIS, ROUTINE W REFLEX MICROSCOPIC
BILIRUBIN URINE: NEGATIVE
GLUCOSE, UA: NEGATIVE mg/dL
Hgb urine dipstick: NEGATIVE
KETONES UR: NEGATIVE mg/dL
Nitrite: NEGATIVE
PH: 6.5 (ref 5.0–8.0)
PROTEIN: NEGATIVE mg/dL
Specific Gravity, Urine: 1.02 (ref 1.005–1.030)
Urobilinogen, UA: 0.2 mg/dL (ref 0.0–1.0)

## 2014-10-21 LAB — URINE MICROSCOPIC-ADD ON

## 2014-10-21 MED ORDER — PROMETHAZINE HCL 25 MG PO TABS
25.0000 mg | ORAL_TABLET | Freq: Once | ORAL | Status: AC
  Administered 2014-10-21: 25 mg via ORAL
  Filled 2014-10-21: qty 1

## 2014-10-21 MED ORDER — ACETAMINOPHEN 500 MG PO TABS
1000.0000 mg | ORAL_TABLET | Freq: Once | ORAL | Status: AC
  Administered 2014-10-21: 1000 mg via ORAL
  Filled 2014-10-21: qty 2

## 2014-10-21 NOTE — MAU Provider Note (Signed)
History     CSN: [id number]  Arrival date and time: 10/21/14 [id number]   First Provider Initiated Contact with Patient 10/21/14 [phone]      Chief Complaint  Patient presents with  . Headache   HPI Comments: [name] is a 19 y.o. Female G1P0 at 25100w4d who presents with a HA.   Headache  This is a new problem. The current episode started yesterday. The problem occurs constantly. The problem has been unchanged. The pain is located in the frontal and temporal region. The pain quality is not similar to prior headaches. The quality of the pain is described as aching. The pain is at a severity of 4/10. Associated symptoms include nausea, photophobia and vomiting. Pertinent negatives include no blurred vision. The symptoms are aggravated by bright light. She has tried nothing for the symptoms.    OB History    Gravida Para Term Preterm AB TAB SAB Ectopic Multiple Living   1               Past Medical History  Diagnosis Date  . 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activity   . Asthma   . Seasonal allergies   . Insomnia   . Irregular heart beat     Past Surgical History  Procedure Laterality Date  . Tooth extraction      Family History  Problem Relation Age of Onset  . Diabetes Mother   . Diabetes Father   . Kidney disease Father     dialysis  . Learning disabilities Maternal Uncle     speach impediment  . Heart disease Maternal Grandmother   . Heart disease Maternal Grandfather     History  Substance Use Topics  . Smoking status: Former Games developermoker  . Smokeless tobacco: Never Used  . Alcohol Use: No    Allergies: No Known Allergies  Prescriptions prior to admission  Medication Sig Dispense Refill Last Dose  . albuterol (PROAIR HFA) 108 (90 BASE) MCG/ACT inhaler Inhale 1 puff into the lungs every 6 (six) hours as needed for wheezing or shortness of breath.   Taking  . cetirizine (ZYRTEC) 10 MG tablet Take 10 mg by mouth daily as needed.   3 Taking  .  Prenatal Vit-Fe Fumarate-FA (PRENATAL MULTIVITAMIN) TABS tablet Take 1 tablet by mouth daily.   Taking   Results for orders placed or performed during the hospital encounter of 10/21/14 (from the past 48 hour(s))  Urinalysis, Routine w reflex microscopic     Status: Abnormal   Collection Time: 10/21/14  9:00 AM  Result Value Ref Range   Color, Urine YELLOW YELLOW   APPearance CLEAR CLEAR   Specific Gravity, Urine 1.020 1.005 - 1.030   pH 6.5 5.0 - 8.0   Glucose, UA NEGATIVE NEGATIVE mg/dL   Hgb urine dipstick NEGATIVE NEGATIVE   Bilirubin Urine NEGATIVE NEGATIVE   Ketones, ur NEGATIVE NEGATIVE mg/dL   Protein, ur NEGATIVE NEGATIVE mg/dL   Urobilinogen, UA 0.2 0.0 - 1.0 mg/dL   Nitrite NEGATIVE NEGATIVE   Leukocytes, UA TRACE (A) NEGATIVE  Urine microscopic-add on     Status: Abnormal   Collection Time: 10/21/14  9:00 AM  Result Value Ref Range   Squamous Epithelial / LPF MANY (A) RARE   WBC, UA 3-6 <3 WBC/hpf   Urine-Other MUCOUS PRESENT     Review of Systems  Eyes: Positive for photophobia. Negative for blurred vision.  Gastrointestinal: Positive for nausea and vomiting.  Neurological: Positive for headaches.   Physical  Exam   Blood pressure 124/70, pulse 140, temperature 97.9 F (36.6 C), temperature source Oral, resp. rate 18, last menstrual period 01/31/2014.  Physical Exam  Constitutional: She is oriented to person, place, and time. She appears well-developed and well-nourished. No distress.  HENT:  Head: Normocephalic.  Eyes: Pupils are equal, round, and reactive to light.  Neck: Neck supple.  Respiratory: Effort normal.  Musculoskeletal: Normal range of motion.  Neurological: She is alert and oriented to person, place, and time.  Skin: Skin is warm. She is not diaphoretic.  Psychiatric: Her behavior is normal.    Fetal Tracing: Baseline: 130 bpm Variability: moderate  Accelerations: 15x15 Decelerations: none Toco: irregular pattern    MAU Course   Procedures  None  MDM Phenergan 25 Tylenol 1 gram PO   Assessment and Plan    A:  1. Nausea/vomiting in pregnancy   2. Headache in pregnancy, third trimester     P:  Discharge home in stable condition  Ok to take tylenol as directed on the bottle  Return to MAU if HA is not resolved with tylenol  Keep appointment with OB as scheduled    Duane LopeJennifer I Kaizer Dissinger, NP 10/21/2014 3:54 PM

## 2014-10-21 NOTE — MAU Note (Signed)
C/o headache since last night @ 2300;

## 2014-10-22 ENCOUNTER — Ambulatory Visit (INDEPENDENT_AMBULATORY_CARE_PROVIDER_SITE_OTHER): Payer: Medicaid Other | Admitting: Obstetrics and Gynecology

## 2014-10-22 ENCOUNTER — Encounter: Payer: Self-pay | Admitting: Obstetrics and Gynecology

## 2014-10-22 VITALS — BP 120/70 | Wt 275.0 lb

## 2014-10-22 DIAGNOSIS — Z3403 Encounter for supervision of normal first pregnancy, third trimester: Secondary | ICD-10-CM

## 2014-10-22 DIAGNOSIS — O360131 Maternal care for anti-D [Rh] antibodies, third trimester, fetus 1: Secondary | ICD-10-CM

## 2014-10-22 NOTE — Progress Notes (Signed)
Patient is doing well without complaints. FM/labor precautions reviewed 

## 2014-10-24 ENCOUNTER — Inpatient Hospital Stay (HOSPITAL_COMMUNITY)
Admission: AD | Admit: 2014-10-24 | Discharge: 2014-10-24 | Disposition: A | Discharge status: Home / Self Care | Payer: Medicaid Other | Source: Ambulatory Visit | Attending: Obstetrics and Gynecology | Admitting: Obstetrics and Gynecology

## 2014-10-24 ENCOUNTER — Encounter (HOSPITAL_COMMUNITY): Payer: Self-pay | Admitting: *Deleted

## 2014-10-24 DIAGNOSIS — Z3A38 38 weeks gestation of pregnancy: Secondary | ICD-10-CM | POA: Diagnosis not present

## 2014-10-24 NOTE — MAU Note (Signed)
States she started feeling contractions about an hour ago [place] [employer]. No leaking or bleeding.

## 2014-10-24 NOTE — Discharge Instructions (Signed)
Keep your scheduled appointment for prenatal care. Drink 8-10 glasses of water per day. °

## 2014-10-29 ENCOUNTER — Encounter: Payer: Self-pay | Admitting: Obstetrics & Gynecology

## 2014-10-29 ENCOUNTER — Ambulatory Visit (INDEPENDENT_AMBULATORY_CARE_PROVIDER_SITE_OTHER): Payer: Medicaid Other | Admitting: Obstetrics & Gynecology

## 2014-10-29 VITALS — BP 113/81 | Wt 275.2 lb

## 2014-10-29 DIAGNOSIS — Z3490 Encounter for supervision of normal pregnancy, unspecified, unspecified trimester: Secondary | ICD-10-CM

## 2014-10-29 NOTE — Patient Instructions (Signed)

## 2014-10-29 NOTE — Progress Notes (Signed)
Pt denies ctx, LOF or VB.  She was seen in the MAU for r/o labor. Labor precautions reviewed

## 2014-11-05 ENCOUNTER — Encounter: Payer: Self-pay | Admitting: Obstetrics and Gynecology

## 2014-11-05 ENCOUNTER — Ambulatory Visit (INDEPENDENT_AMBULATORY_CARE_PROVIDER_SITE_OTHER): Payer: Medicaid Other | Admitting: Obstetrics and Gynecology

## 2014-11-05 VITALS — BP 112/77 | Wt 277.0 lb

## 2014-11-05 DIAGNOSIS — O99213 Obesity complicating pregnancy, third trimester: Secondary | ICD-10-CM

## 2014-11-05 DIAGNOSIS — Z3403 Encounter for supervision of normal first pregnancy, third trimester: Secondary | ICD-10-CM

## 2014-11-05 DIAGNOSIS — O360131 Maternal care for anti-D [Rh] antibodies, third trimester, fetus 1: Secondary | ICD-10-CM

## 2014-11-05 DIAGNOSIS — E669 Obesity, unspecified: Secondary | ICD-10-CM

## 2014-11-05 NOTE — Progress Notes (Signed)
Patient is doing well without complaints. FM/labor precautions reviewed. Will plan for post date testing next week and IOL on 5/28. Membranes stripped today

## 2014-11-08 ENCOUNTER — Encounter (HOSPITAL_COMMUNITY): Payer: Self-pay | Admitting: *Deleted

## 2014-11-08 ENCOUNTER — Inpatient Hospital Stay (HOSPITAL_COMMUNITY)
Admission: AD | Admit: 2014-11-08 | Discharge: 2014-11-08 | Disposition: A | Discharge status: Home / Self Care | Payer: Medicaid Other | Source: Ambulatory Visit | Attending: Obstetrics & Gynecology | Admitting: Obstetrics & Gynecology

## 2014-11-08 ENCOUNTER — Inpatient Hospital Stay (HOSPITAL_COMMUNITY)
Admission: AD | Admit: 2014-11-08 | Discharge: 2014-11-10 | DRG: 775 | Disposition: A | Discharge status: Home / Self Care | Payer: Medicaid Other | Source: Ambulatory Visit | Attending: Obstetrics & Gynecology | Admitting: Obstetrics & Gynecology

## 2014-11-08 DIAGNOSIS — Z3403 Encounter for supervision of normal first pregnancy, third trimester: Secondary | ICD-10-CM

## 2014-11-08 DIAGNOSIS — O99213 Obesity complicating pregnancy, third trimester: Secondary | ICD-10-CM

## 2014-11-08 DIAGNOSIS — O480 Post-term pregnancy: Principal | ICD-10-CM | POA: Diagnosis present

## 2014-11-08 DIAGNOSIS — Z3A40 40 weeks gestation of pregnancy: Secondary | ICD-10-CM | POA: Diagnosis present

## 2014-11-08 LAB — CBC
HEMATOCRIT: 36.3 % (ref 36.0–46.0)
Hemoglobin: 12.3 g/dL (ref 12.0–15.0)
MCH: 29.5 pg (ref 26.0–34.0)
MCHC: 33.9 g/dL (ref 30.0–36.0)
MCV: 87.1 fL (ref 78.0–100.0)
PLATELETS: 298 10*3/uL (ref 150–400)
RBC: 4.17 MIL/uL (ref 3.87–5.11)
RDW: 13.9 % (ref 11.5–15.5)
WBC: 24.3 10*3/uL — ABNORMAL HIGH (ref 4.0–10.5)

## 2014-11-08 LAB — ABO/RH: ABO/RH(D): AB NEG

## 2014-11-08 LAB — RAPID URINE DRUG SCREEN, HOSP PERFORMED
AMPHETAMINES: NOT DETECTED
BENZODIAZEPINES: NOT DETECTED
Barbiturates: NOT DETECTED
COCAINE: NOT DETECTED
Opiates: NOT DETECTED
Tetrahydrocannabinol: NOT DETECTED

## 2014-11-08 LAB — TYPE AND SCREEN
ABO/RH(D): AB NEG
Antibody Screen: NEGATIVE

## 2014-11-08 MED ORDER — LACTATED RINGERS IV SOLN: 500.0000 mL | INTRAVENOUS | Status: DC | PRN

## 2014-11-08 MED ORDER — OXYTOCIN 10 UNIT/ML IJ SOLN
INTRAMUSCULAR | Status: AC
  Administered 2014-11-08: 20 [IU]
  Filled 2014-11-08: qty 2

## 2014-11-08 MED ORDER — SODIUM CHLORIDE 0.9 % IJ SOLN: 3.0000 mL | INTRAMUSCULAR | Status: DC | PRN

## 2014-11-08 MED ORDER — SODIUM CHLORIDE 0.9 % IV SOLN: 250.0000 mL | INTRAVENOUS | Status: DC | PRN

## 2014-11-08 MED ORDER — OXYTOCIN BOLUS FROM INFUSION: 500.0000 mL | INTRAVENOUS | Status: DC

## 2014-11-08 MED ORDER — ZOLPIDEM TARTRATE 5 MG PO TABS: 5.0000 mg | ORAL_TABLET | Freq: Every evening | ORAL | Status: DC | PRN

## 2014-11-08 MED ORDER — OXYTOCIN 40 UNITS IN LACTATED RINGERS INFUSION - SIMPLE MED: 62.5000 mL/h | INTRAVENOUS | Status: DC

## 2014-11-08 MED ORDER — OXYCODONE-ACETAMINOPHEN 5-325 MG PO TABS
1.0000 | ORAL_TABLET | ORAL | Status: DC | PRN
  Filled 2014-11-08: qty 1

## 2014-11-08 MED ORDER — LANOLIN HYDROUS EX OINT: TOPICAL_OINTMENT | CUTANEOUS | Status: DC | PRN

## 2014-11-08 MED ORDER — OXYTOCIN 40 UNITS IN LACTATED RINGERS INFUSION - SIMPLE MED: 62.5000 mL/h | INTRAVENOUS | Status: DC | PRN

## 2014-11-08 MED ORDER — CITRIC ACID-SODIUM CITRATE 334-500 MG/5ML PO SOLN: 30.0000 mL | ORAL | Status: DC | PRN

## 2014-11-08 MED ORDER — IBUPROFEN 600 MG PO TABS
600.0000 mg | ORAL_TABLET | Freq: Four times a day (QID) | ORAL | Status: DC
  Administered 2014-11-08 – 2014-11-10 (×9): 600 mg via ORAL
  Filled 2014-11-08 (×9): qty 1

## 2014-11-08 MED ORDER — LIDOCAINE HCL (PF) 1 % IJ SOLN
INTRAMUSCULAR | Status: AC
  Filled 2014-11-08: qty 30

## 2014-11-08 MED ORDER — ACETAMINOPHEN 325 MG PO TABS: 650.0000 mg | ORAL_TABLET | ORAL | Status: DC | PRN

## 2014-11-08 MED ORDER — TETANUS-DIPHTH-ACELL PERTUSSIS 5-2.5-18.5 LF-MCG/0.5 IM SUSP: 0.5000 mL | Freq: Once | INTRAMUSCULAR | Status: DC

## 2014-11-08 MED ORDER — SIMETHICONE 80 MG PO CHEW: 80.0000 mg | CHEWABLE_TABLET | ORAL | Status: DC | PRN

## 2014-11-08 MED ORDER — WITCH HAZEL-GLYCERIN EX PADS: 1.0000 "application " | MEDICATED_PAD | CUTANEOUS | Status: DC | PRN

## 2014-11-08 MED ORDER — ACETAMINOPHEN 325 MG PO TABS
650.0000 mg | ORAL_TABLET | ORAL | Status: DC | PRN
  Administered 2014-11-09: 650 mg via ORAL
  Filled 2014-11-08: qty 2

## 2014-11-08 MED ORDER — LACTATED RINGERS IV SOLN: INTRAVENOUS | Status: DC

## 2014-11-08 MED ORDER — DIPHENHYDRAMINE HCL 25 MG PO CAPS: 25.0000 mg | ORAL_CAPSULE | Freq: Four times a day (QID) | ORAL | Status: DC | PRN

## 2014-11-08 MED ORDER — OXYCODONE-ACETAMINOPHEN 5-325 MG PO TABS: 2.0000 | ORAL_TABLET | ORAL | Status: DC | PRN

## 2014-11-08 MED ORDER — SENNOSIDES-DOCUSATE SODIUM 8.6-50 MG PO TABS
2.0000 | ORAL_TABLET | ORAL | Status: DC
  Administered 2014-11-09 (×2): 2 via ORAL
  Filled 2014-11-08 (×2): qty 2

## 2014-11-08 MED ORDER — PNEUMOCOCCAL VAC POLYVALENT 25 MCG/0.5ML IJ INJ
0.5000 mL | INJECTION | INTRAMUSCULAR | Status: AC
  Administered 2014-11-09: 0.5 mL via INTRAMUSCULAR
  Filled 2014-11-08: qty 0.5

## 2014-11-08 MED ORDER — PRENATAL MULTIVITAMIN CH
1.0000 | ORAL_TABLET | Freq: Every day | ORAL | Status: DC
Start: 2014-11-08 — End: 2014-11-10
  Administered 2014-11-09 – 2014-11-10 (×2): 1 via ORAL
  Filled 2014-11-08 (×2): qty 1

## 2014-11-08 MED ORDER — BENZOCAINE-MENTHOL 20-0.5 % EX AERO
1.0000 "application " | INHALATION_SPRAY | CUTANEOUS | Status: DC | PRN
  Administered 2014-11-08 (×2): 1 via TOPICAL
  Filled 2014-11-08 (×2): qty 56

## 2014-11-08 MED ORDER — ONDANSETRON HCL 4 MG PO TABS: 4.0000 mg | ORAL_TABLET | ORAL | Status: DC | PRN

## 2014-11-08 MED ORDER — ONDANSETRON HCL 4 MG/2ML IJ SOLN: 4.0000 mg | Freq: Four times a day (QID) | INTRAMUSCULAR | Status: DC | PRN

## 2014-11-08 MED ORDER — SODIUM CHLORIDE 0.9 % IJ SOLN
3.0000 mL | Freq: Two times a day (BID) | INTRAMUSCULAR | Status: DC
Start: 2014-11-08 — End: 2014-11-09

## 2014-11-08 MED ORDER — DIBUCAINE 1 % RE OINT: 1.0000 "application " | TOPICAL_OINTMENT | RECTAL | Status: DC | PRN

## 2014-11-08 MED ORDER — ZOLPIDEM TARTRATE 5 MG PO TABS
5.0000 mg | ORAL_TABLET | Freq: Once | ORAL | Status: AC
  Administered 2014-11-08: 5 mg via ORAL
  Filled 2014-11-08: qty 1

## 2014-11-08 MED ORDER — ONDANSETRON HCL 4 MG/2ML IJ SOLN: 4.0000 mg | INTRAMUSCULAR | Status: DC | PRN

## 2014-11-08 MED ORDER — RHO D IMMUNE GLOBULIN 1500 UNIT/2ML IJ SOSY
300.0000 ug | PREFILLED_SYRINGE | Freq: Once | INTRAMUSCULAR | Status: AC
  Administered 2014-11-08: 300 ug via INTRAMUSCULAR
  Filled 2014-11-08: qty 2

## 2014-11-08 MED ORDER — OXYCODONE-ACETAMINOPHEN 5-325 MG PO TABS: 1.0000 | ORAL_TABLET | ORAL | Status: DC | PRN

## 2014-11-08 MED ORDER — LIDOCAINE HCL (PF) 1 % IJ SOLN
30.0000 mL | INTRAMUSCULAR | Status: AC | PRN
  Administered 2014-11-08: 30 mL via SUBCUTANEOUS
  Filled 2014-11-08: qty 30

## 2014-11-08 MED ORDER — OXYTOCIN 40 UNITS IN LACTATED RINGERS INFUSION - SIMPLE MED
INTRAVENOUS | Status: AC
  Filled 2014-11-08: qty 1000

## 2014-11-08 NOTE — H&P (Signed)
Nicole Dillon is a 19 y.o. female presenting for impending delivery SVE 10/100/+1. History OB History    Gravida Para Term Preterm AB TAB SAB Ectopic Multiple Living   1              Past Medical History  Diagnosis Date  . 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activity   . Asthma   . Seasonal allergies   . Insomnia   . Irregular heart beat    Past Surgical History  Procedure Laterality Date  . Tooth extraction     Family History: family history includes Diabetes in her father and mother; Heart disease in her maternal grandfather and maternal grandmother; Kidney disease in her father; Learning disabilities in her maternal uncle. Social History:  reports that she quit smoking about 7 months ago. She has never used smokeless tobacco. She reports that she does not drink alcohol or use illicit drugs.   Prenatal Transfer Tool  Maternal Diabetes: No Genetic Screening: Normal Maternal Ultrasounds/Referrals: Normal Fetal Ultrasounds or other Referrals:  None Maternal Substance Abuse:  No Significant Maternal Medications:  None Significant Maternal Lab Results:  None Other Comments:  None  Review of Systems  Constitutional: Negative.   HENT: Negative.   Eyes: Negative.   Respiratory: Negative.   Cardiovascular: Negative.   Gastrointestinal: Positive for abdominal pain.  Genitourinary: Negative.   Musculoskeletal: Negative.   Skin: Negative.   Neurological: Negative.   Endo/Heme/Allergies: Negative.   Psychiatric/Behavioral: Negative.     Dilation: 10 Station: +1 Exam by:: ginger morris RN Last menstrual period 01/31/2014. Maternal Exam:  Uterine Assessment: Contraction strength is firm.  Contraction frequency is regular.   Abdomen: Fetal presentation: vertex  Introitus: Normal vulva. Normal vagina.  Amniotic fluid character: clear.  Pelvis: adequate for delivery.   Cervix: Cervix evaluated by digital exam.     Fetal Exam Fetal Monitor Review: Mode:  ultrasound.   Variability: moderate (6-25 bpm).   Pattern: accelerations present.    Fetal State Assessment: Category I - tracings are normal.     Physical Exam  Constitutional: She is oriented to person, place, and time. She appears well-developed and well-nourished.  HENT:  Head: Normocephalic.  Eyes: Pupils are equal, round, and reactive to light.  Neck: Normal range of motion.  Cardiovascular: Normal rate, regular rhythm, normal heart sounds and intact distal pulses.   Respiratory: Effort normal and breath sounds normal.  GI: Soft. Bowel sounds are normal.  Genitourinary: Vagina normal.  Musculoskeletal: Normal range of motion.  Neurological: She is alert and oriented to person, place, and time. She has normal reflexes.  Skin: Skin is warm and dry.  Psychiatric: She has a normal mood and affect. Her behavior is normal. Judgment and thought content normal.    Prenatal labs: ABO, Rh: AB/NEG/-- (10/14 1431) Antibody: NEG (10/14 1431) Rubella: 4.15 (10/14 1431) RPR: NON REAC (03/09 1128)  HBsAg: NEGATIVE (10/14 1431)  HIV: NONREACTIVE (03/09 1128)  GBS: Negative (04/21 0000)   Assessment/Plan: Impending delivery   Nicole Dillon Nicole Dillon 11/08/2014, 10:00 AM

## 2014-11-08 NOTE — Progress Notes (Signed)
Dr Vella RedheadKLegos notified of pt's admission and status. Aware of ctx pattern, normal v/s except for rapid heart rate 150s-160. RN to ck cervix.

## 2014-11-08 NOTE — Lactation Note (Signed)
This note was copied from the chart of Nicole Dillon. Lactation Consultation Note  Patient Name: Nicole Dillon ZOXWR'UToday's Date: 11/08/2014 Reason for consult: Initial assessment Baby 7 hours of life. Mom reports that she has given 2 bottles of formula, and has used DEBP once already. Mom's plan is to pump and bottle-feed EBM. Enc mom to pump every 3 hours for 15 minutes. Mom states that she was prescribed Adderall for ADD and was taking medication prior to pregnancy, but stopped taking it when she found out she was pregnant. Mom states that she may go back on Adderall now that the baby is born. Mom given printed information from [facility] 2014 edition of "Medications and Mother's Milk," regarding compatibility of the baby getting her breast milk while she is taking Adderall. Enc mom to review information with her HCP as clinical doses are considered "probably compatible," but doses outside of the clinical range are not.   Mom given [facility] brochure, aware of OP appointments, community resources, and [facility] phone [facility] assistance after D/C.   Maternal Data Does the patient have breastfeeding experience prior to this delivery?: No  Feeding Feeding Type: Bottle Fed - Formula Nipple Type: Slow - flow  LATCH Score/Interventions                      Lactation Tools Discussed/Used Pump Review: Setup, frequency, and cleaning Initiated by:: EH Date initiated:: 11/08/14   Consult Status Consult Status: Follow-up Date: 11/09/14 Follow-up type: In-patient    Nicole OchsWILLIARD, Nicole Dillon 11/08/2014, 5:56 PM

## 2014-11-08 NOTE — Progress Notes (Signed)
Zerita Boersarlene Lawson CNM notified of pt's arrival and VE, orders received to admit

## 2014-11-08 NOTE — Progress Notes (Signed)
Called and notified Nicole Dillon,CNM of heart rate running 140's-150's and no distress noted. Pt states, "It's been like this for a while."  Order given to obtain a urine drug screen and just watch patient.

## 2014-11-09 ENCOUNTER — Telehealth: Payer: Self-pay

## 2014-11-09 LAB — RH IG WORKUP (INCLUDES ABO/RH)
ABO/RH(D): AB NEG
FETAL SCREEN: NEGATIVE
GESTATIONAL AGE(WKS): 40
Unit division: 0

## 2014-11-09 LAB — RPR: RPR Ser Ql: NONREACTIVE

## 2014-11-09 NOTE — Progress Notes (Signed)
Apical pulse 140. Patient states she has always had a high pulse rate. Bilateral breath sounds clear.Denies dizziness or SOB.

## 2014-11-09 NOTE — Progress Notes (Signed)
Post Partum Day 1 Subjective: no complaints, up ad lib, voiding and tolerating PO  Objective: Blood pressure 118/81, pulse 127, temperature 97.9 F (36.6 C), temperature source Oral, resp. rate 18, height 5\' 4"  (1.626 m), weight 277 lb (125.646 kg), last menstrual period 01/31/2014, SpO2 100 %, unknown if currently breastfeeding.  Physical Exam:  General: alert, cooperative, appears stated age and no distress Lochia: appropriate Uterine Fundus: firm Incision: n/a DVT Evaluation: No evidence of DVT seen on physical exam. Negative Homan's sign. No cords or calf tenderness.   Recent Labs  11/08/14 1212  HGB 12.3  HCT 36.3    Assessment/Plan: Plan for discharge tomorrow   LOS: 1 day   LAWSON, MARIE DARLENE 11/09/2014, 7:16 AM

## 2014-11-09 NOTE — Progress Notes (Signed)
Attended 'Well After Birth' group class which presented discharge care information for both Mom and Baby. 

## 2014-11-09 NOTE — Telephone Encounter (Signed)
PLEASE NOTE: All timestamps contained within this report are represented as [place] Standard Time. CONFIDENTIALTY NOTICE: This fax transmission is intended only for the addressee. It contains information that is legally privileged, confidential or otherwise protected from use or disclosure. If you are not the intended recipient, you are strictly prohibited from reviewing, disclosing, copying using or disseminating any of this information or taking any action in reliance on or regarding this information. If you have received this fax in error, please notify us immediately by telephone so that we can arrange for its return to us. Phone: [phone], Toll-Free: [phone], Fax: [phone] Page: 1 of 2 Call Id: [id number] [facility] Client TELEPHONE ADVICE RECORD [facility] Patient Name: Nicole ForthAMIKA Dillon Gender: Female DOB: 03/28/1996 Age: 19 Y 10 M 19 D Return Phone Number: [phone] (Primary) Address: [place]: [place] [id number] Client [facility] Contact Type Call Call Type Triage / Clinical Relationship To Patient Self Return Phone Number [phone] (Primary) Chief Complaint Pregnancy question (general) Initial Comment Caller states she is 9 months pregnant, her urine went from yellow and cloudy to clear in 10 min, she has a question about why this happened. CBWN: This is her 4th time peeding. She pees every 10 min. PreDisposition Go to ED Nurse Assessment Nurse: Phylliss Bobowe, RN, Synetta FailAnita Date/Time Lamount Cohen(Eastern Time): 11/06/2014 6:11:36 PM Confirm and document reason for call. If symptomatic, describe symptoms. ---Caller states she is 9 months pregnant, her urine went from yellow and cloudy to clear in 10 min, she has a question about why this happened. CBWN: This is her 4th time peeing. She pees every 10 min. caller stated that she has  been having no urination pain and has been having frequency and has had no fevers and has had no back pain at present Has the patient traveled out of the country within the last 30 days? ---No Does the patient require triage? ---Yes Related visit to physician within the last 2 weeks? ---Yes Does the PT have any chronic conditions? (i.e. diabetes, asthma, etc.) ---No Did the patient indicate they were pregnant? ---Yes How many weeks gestation? ---39 weeks Have you felt decreased fetal movement? ---No Guidelines Guideline Title Affirmed Question Affirmed Notes Nurse Date/Time (Eastern Time) Pregnancy - Urination Pain [1] Painful urination in pregnancy AND [2] no current antibiotic treatment Phylliss Bobowe, RN, Synetta FailAnita 11/06/2014 6:14:08 PM Disp. Time Lamount Cohen(Eastern Time) Disposition Final User 11/06/2014 6:17:03 PM See Physician within 24 Hours Yes Phylliss Bobowe, RN, Synetta FailAnita PLEASE NOTE: All timestamps contained within this report are represented as [place] Standard Time. CONFIDENTIALTY NOTICE: This fax transmission is intended only for the addressee. It contains information that is legally privileged, confidential or otherwise protected from use or disclosure. If you are not the intended recipient, you are strictly prohibited from reviewing, disclosing, copying using or disseminating any of this information or taking any action in reliance on or regarding this information. If you have received this fax in error, please notify us immediately by telephone so that we can arrange for its return to us. Phone: [phone], Toll-Free: [phone], Fax: [phone] Page: 2 of 2 Call Id: [id number] Caller Understands: Yes Disagree/Comply: Comply Care Advice Given Per Guideline SEE PHYSICIAN WITHIN 24 HOURS: * IF OFFICE WILL BE OPEN: You need to be examined within the next 24 hours. Call your doctor when the office opens, and make an appointment. REASSURANCE: This could be an urinary tract  infection. You should see  your PCP to be examined and tested. FLUIDS: Drink extra fluids. Drink 8-10 glasses of liquids a day. (Reason: to produce a dilute, non-irritating urine.) * Some people think that drinking cranberry juice may help in fighting urinary tract infections. However, there is no good research that has ever proved this. * Dosage Cranberry Juice Cocktail: 8 oz (240 ml) twice a day. CAUTION - CRANBERRY JUICE: * Do not drink more than 16 oz (480 ml) of cranberry juice cocktail per day (Reason: too much cranberry juice can also be irritating to the bladder). CALL BACK IF: * Pain with urination becomes severe * Fever over 100.4 F (38.0 C) occurs * Back pain occurs * You become worse. CARE ADVICE given per Pregnancy - Urination Pain (Adult) guideline. After Care Instructions Given Call Event Type User Date / Time Description Referrals [facility]

## 2014-11-09 NOTE — Progress Notes (Signed)
UR chart review completed.  

## 2014-11-09 NOTE — Progress Notes (Signed)
Patients heart rate has been tachycardic since admission. Takes Adderall for ADD.

## 2014-11-09 NOTE — Lactation Note (Signed)
This note was copied from the chart of Nicole Lenard Forthamika Nobbe. Lactation Consultation Note Mom plans on pumping and bottle feeding. Mom is giving formula. Mom slept through the night w/o pumping. Encouraged mom to pump every three hours to build up her milk supply.  Mom has DEBP set up. Gave soap for cleaning demonstrated and washed parts for her.  Hand expression taught, got mom to try, had a hard time understanding not to pinch nipple, to go behind areola and breast massage, excited to see some colostrum. Mom wasn't firm enough w/expression and massage. Encouraged to feel glands and that was what she needed to massage. Mom has been totally formula feeding. Discussed supply and demand and engorgement.   Not sure how dedicated mom is to the pumping and bottle feeding.  Patient Name: Nicole Dillon GNFAO'ZToday's Date: 11/09/2014 Reason for consult: Follow-up assessment   Maternal Data Has patient been taught Hand Expression?: Yes  Feeding    LATCH Score/Interventions       Type of Nipple: Everted at rest and after stimulation        Intervention(s): Breastfeeding basics reviewed;Support Pillows;Skin to skin     Lactation Tools Discussed/Used Tools: Pump Breast pump type: Double-Electric Breast Pump Pump Review: Setup, frequency, and cleaning;Milk Storage Initiated by:: Peri JeffersonL. Sohaib Vereen RN   Consult Status Consult Status: Follow-up Date: 11/10/14 Follow-up type: In-patient    Ruchel Brandenburger, Diamond NickelLAURA G 11/09/2014, 6:04 AM

## 2014-11-09 NOTE — Telephone Encounter (Signed)
[facility]

## 2014-11-09 NOTE — Plan of Care (Signed)
Problem: Phase I Progression Outcomes Goal: VS, stable, temp < 100.4 degrees F Outcome: Progressing History of tachycardia

## 2014-11-09 NOTE — Progress Notes (Signed)
Dr. Ane PaymentMcEachern called to report pt. Tachycardia and heart rate levels. Will consider ? EKG in am to r/o cardiac problems.

## 2014-11-10 MED ORDER — IBUPROFEN 600 MG PO TABS: 600.0000 mg | ORAL_TABLET | Freq: Four times a day (QID) | ORAL | Status: DC

## 2014-11-10 NOTE — Lactation Note (Signed)
This note was copied from the chart of Nicole Dillon. Lactation Consultation Note  Mother states she is a lot of pain with breastfeeding. Offered to assist with latch and mother refused. Mother plans to pump and bottle feed for one month and then switch to formula. Discussed the importance of pumping 8x a day if she wants to give the baby breastmilk. Reviewed supply and demand.  Provided mother with a hand pump. Reviewed engorgement care and monitoring voids/stools. [facility]  Unsure of mother's commitment to pumping.  Patient Name: Nicole Lenard Forthamika Albino WUJWJ'XToday's Date: 11/10/2014 Reason for consult: Follow-up assessment   Maternal Data    Feeding    LATCH Score/Interventions                      Lactation Tools Discussed/Used     Consult Status Consult Status: Complete    Hardie PulleyBerkelhammer, Ruth Boschen 11/10/2014, 10:29 AM

## 2014-11-10 NOTE — Discharge Summary (Cosign Needed)
Obstetric Discharge Summary Reason for Admission: onset of labor Prenatal Procedures: NST Intrapartum Procedures: spontaneous vaginal delivery Postpartum Procedures: none Complications-Operative and Postpartum: 2nd degree perineal laceration  Delivery Note At 10:08 AM a viable female was delivered via Vaginal, Spontaneous Delivery (Presentation:vertex ; Occiput Anterior). APGAR: 9, 9; weight .  Placenta status: Intact, Spontaneous. Cord: 3 vessels with the following complications: None. Cord pH: n/a  Anesthesia: None  Episiotomy: None Lacerations: 2nd degree;Perineal Suture Repair: 2.0 vicryl Est. Blood Loss 250(mL):   Mom to postpartum. Baby to Couplet care / Skin to Skin.  Nicole Dillon, Nicole Dillon 11/08/2014, 10:23 AM   Hospital Course:  Principal Problem:   Vaginal delivery   Nicole Dillon is a 19 y.o. G1P1001 s/p NSVD.  Patient was admitted for spontaneous labor at term.  She has postpartum course that was uncomplicated including no problems with ambulating, PO intake, urination, pain, or bleeding. The pt feels ready to go home and  will be discharged with outpatient follow-up. On the day of discharge, patients heart rate was noted to be in the 140s-150s, has been in the outpatient setting as well. Obtained and EKG prior to discharge.   Today: No acute events overnight.  Pt denies problems with ambulating, voiding or po intake.  She denies nausea or vomiting.  Pain is well controlled.  She has had flatus. She has not had bowel movement.  Lochia Small.  Plan for birth control is  unsure, declines postpartum Nexplanon.  Method of Feeding: breast and formula  Physical Exam:  General: alert, cooperative and no distress Lochia: appropriate Uterine Fundus: firm DVT Evaluation: No evidence of DVT seen on physical exam.  H/H: Lab Results  Component Value Date/Time   HGB 12.3 11/08/2014 12:12 PM   HCT 36.3 11/08/2014 12:12 PM    Discharge Diagnoses: Term  Pregnancy-delivered  Discharge Information: Date: 11/10/2014 Activity: pelvic rest Diet: routine  Medications: PNV, Ibuprofen and Colace Breast feeding:  Yes Condition: stable Instructions: refer to handout Discharge to: home   Discharge Instructions    Activity as tolerated    Complete by:  As directed      Call MD for:  difficulty breathing, headache or visual disturbances    Complete by:  As directed      Call MD for:  extreme fatigue    Complete by:  As directed      Call MD for:  hives    Complete by:  As directed      Call MD for:  persistant dizziness or light-headedness    Complete by:  As directed      Call MD for:  persistant nausea and vomiting    Complete by:  As directed      Call MD for:  severe uncontrolled pain    Complete by:  As directed      Call MD for:  temperature >100.4    Complete by:  As directed      Diet - low sodium heart healthy    Complete by:  As directed      Discharge instructions    Complete by:  As directed   [facility] for follow up.  Uterine Changes After pains, or cramping, are normal. This cramping means that the uterus is contracting to return to its non-pregnant size. The uterus takes 5-6 weeks to return to its non-pregnant size.  Vaginal Discharge Usually lasts about 10 days to 4 weeks. The color will change  from bright red to brownish to tan and will become less in amount and finally disappear.   Menstruation: your period will resume in approximately 6-8 weeks, unless breastfeeding.  Activity Rest! Do not do heavy housework or heavy exercise for two weeks. Avoid driving for 1-2 weeks. Check with your doctor for limitations on activities if you have had a C-Section.  Avoid sexual activity, douching or tampons until your postpartum visit.     Sexual acrtivity    Complete by:  As directed   Pelvic Rest            Medication List    TAKE these medications        ADDERALL XR 30 MG  24 hr capsule  Generic drug:  amphetamine-dextroamphetamine  Take 30 mg by mouth daily.     ibuprofen 600 MG tablet  Commonly known as:  ADVIL,MOTRIN  Take 1 tablet (600 mg total) by mouth every 6 (six) hours.     prenatal multivitamin Tabs tablet  Take 1 tablet by mouth daily.     PROAIR HFA 108 (90 BASE) MCG/ACT inhaler  Generic drug:  albuterol  Inhale 1 puff into the lungs every 6 (six) hours as needed for wheezing or shortness of breath.           Follow-up Information    Follow [facility] with [facility]. Schedule an appointment as soon as possible for a visit in 6 weeks.   Specialty:  Obstetrics and Gynecology   Why:  Postpartum visit   Contact information:   [address] [place] [address] [phone]      Nicole Dillon ,MD OB Fellow 11/10/2014,8:46 AM

## 2014-11-10 NOTE — Discharge Instructions (Signed)

## 2014-11-11 ENCOUNTER — Encounter: Payer: Medicaid Other | Admitting: Obstetrics & Gynecology

## 2014-11-27 ENCOUNTER — Ambulatory Visit: Payer: Medicaid Other | Admitting: Obstetrics & Gynecology

## 2014-12-11 ENCOUNTER — Emergency Department (HOSPITAL_COMMUNITY)
Admission: EM | Admit: 2014-12-11 | Discharge: 2014-12-12 | Disposition: A | Discharge status: Home / Self Care | Payer: Medicaid Other | Attending: Emergency Medicine | Admitting: Emergency Medicine

## 2014-12-11 ENCOUNTER — Encounter (HOSPITAL_COMMUNITY): Payer: Self-pay | Admitting: *Deleted

## 2014-12-11 DIAGNOSIS — Z23 Encounter for immunization: Secondary | ICD-10-CM | POA: Diagnosis not present

## 2014-12-11 DIAGNOSIS — Z8679 Personal history of other diseases of the circulatory system: Secondary | ICD-10-CM | POA: Diagnosis not present

## 2014-12-11 DIAGNOSIS — S91331A Puncture wound without foreign body, right foot, initial encounter: Secondary | ICD-10-CM | POA: Diagnosis not present

## 2014-12-11 DIAGNOSIS — Y9389 Activity, other specified: Secondary | ICD-10-CM | POA: Insufficient documentation

## 2014-12-11 DIAGNOSIS — Z87891 Personal history of nicotine dependence: Secondary | ICD-10-CM | POA: Insufficient documentation

## 2014-12-11 DIAGNOSIS — W450XXA Nail entering through skin, initial encounter: Secondary | ICD-10-CM | POA: Insufficient documentation

## 2014-12-11 DIAGNOSIS — Y9289 Other specified places as the place of occurrence of the external cause: Secondary | ICD-10-CM | POA: Diagnosis not present

## 2014-12-11 DIAGNOSIS — Y998 Other external cause status: Secondary | ICD-10-CM | POA: Insufficient documentation

## 2014-12-11 DIAGNOSIS — T148XXA Other injury of unspecified body region, initial encounter: Secondary | ICD-10-CM

## 2014-12-11 DIAGNOSIS — Z79899 Other long term (current) drug therapy: Secondary | ICD-10-CM | POA: Insufficient documentation

## 2014-12-11 DIAGNOSIS — Z8669 Personal history of other diseases of the nervous system and sense organs: Secondary | ICD-10-CM | POA: Insufficient documentation

## 2014-12-11 DIAGNOSIS — E119 Type 2 diabetes mellitus without complications: Secondary | ICD-10-CM | POA: Insufficient documentation

## 2014-12-11 DIAGNOSIS — J45909 Unspecified asthma, uncomplicated: Secondary | ICD-10-CM | POA: Diagnosis not present

## 2014-12-11 MED ORDER — TETANUS-DIPHTH-ACELL PERTUSSIS 5-2.5-18.5 LF-MCG/0.5 IM SUSP
0.5000 mL | Freq: Once | INTRAMUSCULAR | Status: AC
  Administered 2014-12-12: 0.5 mL via INTRAMUSCULAR
  Filled 2014-12-11: qty 0.5

## 2014-12-11 NOTE — ED Notes (Signed)
Pt stepped on a nail 1 hr ago on rt foot. Pt took nail out. Last tetanus show was when she was a child.

## 2014-12-11 NOTE — ED Provider Notes (Signed)
CSN: [id number]     Arrival date & time 12/11/14  2226 History  This chart was scribed for Santiago Glad, PA-C, working with Tomasita Crumble, MD by Chestine Spore, ED Scribe. The patient was seen in room TR11C/TR11C at 11:43 PM.    No chief complaint on file.     The history is provided by the patient. No language interpreter was used.    HPI Comments: Nicole Dillon is a 19 y.o. female who presents to the Emergency Department complaining of stepping on a nail onset 2 hours ago. She notes that she stepped on the nail with her right foot and she has since removed the nail. Pt was barefoot at the time of the injury. Pt brought the nail in with her and the nail is intact. Pt last tetanus was when she was younger.  She denies numbness, tingling, drainage, and any other symptoms. Denies medical hx of DM.     Past Medical History  Diagnosis Date  . 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activity   . Asthma   . Seasonal allergies   . Insomnia   . Irregular heart beat    Past Surgical History  Procedure Laterality Date  . Tooth extraction     Family History  Problem Relation Age of Onset  . Diabetes Mother   . Diabetes Father   . Kidney disease Father     dialysis  . Learning disabilities Maternal Uncle     speach impediment  . Heart disease Maternal Grandmother   . Heart disease Maternal Grandfather    History  Substance Use Topics  . Smoking status: Former Smoker    Quit date: 03/10/2014  . Smokeless tobacco: Never Used  . Alcohol Use: No   OB History    Gravida Para Term Preterm AB TAB SAB Ectopic Multiple Living   1 1 1       0 1     Review of Systems  Skin: Positive for wound. Negative for color change.  Neurological: Negative for numbness.      Allergies  Review of patient's allergies indicates no known allergies.  Home Medications   Prior to Admission medications   Medication Sig Start Date End Date Taking? Authorizing Provider  ADDERALL XR 30 MG 24 hr  capsule Take 30 mg by mouth daily. 09/22/14   Historical Provider, MD  albuterol (PROAIR HFA) 108 (90 BASE) MCG/ACT inhaler Inhale 1 puff into the lungs every 6 (six) hours as needed for wheezing or shortness of breath.    Historical Provider, MD  ibuprofen (ADVIL,MOTRIN) 600 MG tablet Take 1 tablet (600 mg total) by mouth every 6 (six) hours. 11/10/14   William Dalton, MD  Prenatal Vit-Fe Fumarate-FA (PRENATAL MULTIVITAMIN) TABS tablet Take 1 tablet by mouth daily.    Historical Provider, MD   BP 105/67 mmHg  Pulse 126  Temp(Src) 97.9 F (36.6 C) (Oral)  Resp 15  SpO2 100% Physical Exam  Constitutional: She is oriented to person, place, and time. She appears well-developed and well-nourished. No distress.  HENT:  Head: Normocephalic and atraumatic.  Eyes: EOM are normal.  Neck: Neck supple. No tracheal deviation present.  Cardiovascular: Normal rate, regular rhythm and normal heart sounds.  Exam reveals no gallop and no friction rub.   No murmur heard. Pulses:      Dorsalis pedis pulses are 2+ on the right side.  Pulmonary/Chest: Effort normal. No respiratory distress.  Musculoskeletal: Normal range of motion.  Neurological: She is alert and oriented to  person, place, and time.  Distal sensation of foot intact.  Skin: Skin is warm and dry.  Small puncture wound to the bottom of the foot. No active bleeding no drainage no surrounding erythema edema or warmth.  Psychiatric: She has a normal mood and affect. Her behavior is normal.  Nursing note and vitals reviewed.   ED Course  Procedures (including critical care time) DIAGNOSTIC STUDIES: Oxygen Saturation is 100% on RA, nl by my interpretation.    COORDINATION OF CARE: 11:47 PM-Discussed treatment plan which includes keep area clean and update tetanus with pt at bedside and pt agreed to plan.   Labs Review Labs Reviewed - No data to display  Imaging Review No results found.   EKG Interpretation None      MDM   Final  diagnoses:  None   Patient presents today with a puncture wound to the bottom of the foot.  No signs of infection.  Patient brought the nail in with her, which was intact.  Tetanus updated in the ED.  Patient stable for discharge.  Return precautions given.   Santiago Glad, PA-C 12/13/14 1610  Tomasita Crumble, MD 12/13/14 1539

## 2014-12-12 MED ORDER — IBUPROFEN 400 MG PO TABS
600.0000 mg | ORAL_TABLET | Freq: Once | ORAL | Status: AC
  Administered 2014-12-12: 600 mg via ORAL
  Filled 2014-12-12: qty 2

## 2015-05-31 ENCOUNTER — Encounter: Payer: Self-pay | Admitting: Family Medicine

## 2015-05-31 ENCOUNTER — Ambulatory Visit (INDEPENDENT_AMBULATORY_CARE_PROVIDER_SITE_OTHER): Payer: Medicaid Other | Admitting: Family Medicine

## 2015-05-31 ENCOUNTER — Other Ambulatory Visit (HOSPITAL_COMMUNITY)
Admission: RE | Admit: 2015-05-31 | Discharge: 2015-05-31 | Disposition: A | Discharge status: Home / Self Care | Payer: Medicaid Other | Source: Ambulatory Visit | Attending: Family Medicine | Admitting: Family Medicine

## 2015-05-31 VITALS — BP 108/72 | HR 121 | Wt 265.0 lb

## 2015-05-31 DIAGNOSIS — J4520 Mild intermittent asthma, uncomplicated: Secondary | ICD-10-CM | POA: Insufficient documentation

## 2015-05-31 DIAGNOSIS — J3089 Other allergic rhinitis: Secondary | ICD-10-CM

## 2015-05-31 DIAGNOSIS — Z113 Encounter for screening for infections with a predominantly sexual mode of transmission: Secondary | ICD-10-CM | POA: Insufficient documentation

## 2015-05-31 DIAGNOSIS — Z36 Encounter for antenatal screening of mother: Secondary | ICD-10-CM | POA: Diagnosis not present

## 2015-05-31 DIAGNOSIS — Z3482 Encounter for supervision of other normal pregnancy, second trimester: Secondary | ICD-10-CM | POA: Diagnosis not present

## 2015-05-31 DIAGNOSIS — Z23 Encounter for immunization: Secondary | ICD-10-CM

## 2015-05-31 DIAGNOSIS — Z3492 Encounter for supervision of normal pregnancy, unspecified, second trimester: Secondary | ICD-10-CM

## 2015-05-31 HISTORY — DX: Other allergic rhinitis: J30.89

## 2015-05-31 HISTORY — DX: Mild intermittent asthma, uncomplicated: J45.20

## 2015-05-31 MED ORDER — CETIRIZINE HCL 10 MG PO TABS: 10.0000 mg | ORAL_TABLET | Freq: Every day | ORAL | Status: DC

## 2015-05-31 NOTE — Patient Instructions (Signed)
Second Trimester of Pregnancy The second trimester is from week 13 through week 28, months 4 through 6. The second trimester is often a time when you feel your best. Your body has also adjusted to being pregnant, and you begin to feel better physically. Usually, morning sickness has lessened or quit completely, you may have more energy, and you may have an increase in appetite. The second trimester is also a time when the fetus is growing rapidly. At the end of the sixth month, the fetus is about 9 inches long and weighs about 1 pounds. You will likely begin to feel the baby move (quickening) between 18 and 20 weeks of the pregnancy. BODY CHANGES Your body goes through many changes during pregnancy. The changes vary from woman to woman.   Your weight will continue to increase. You will notice your lower abdomen bulging out.  You may begin to get stretch marks on your hips, abdomen, and breasts.  You may develop headaches that can be relieved by medicines approved by your health care provider.  You may urinate more often because the fetus is pressing on your bladder.  You may develop or continue to have heartburn as a result of your pregnancy.  You may develop constipation because certain hormones are causing the muscles that push waste through your intestines to slow down.  You may develop hemorrhoids or swollen, bulging veins (varicose veins).  You may have back pain because of the weight gain and pregnancy hormones relaxing your joints between the bones in your pelvis and as a result of a shift in weight and the muscles that support your balance.  Your breasts will continue to grow and be tender.  Your gums may bleed and may be sensitive to brushing and flossing.  Dark spots or blotches (chloasma, mask of pregnancy) may develop on your face. This will likely fade after the baby is born.  A dark line from your belly button to the pubic area (linea nigra) may appear. This will likely  fade after the baby is born.  You may have changes in your hair. These can include thickening of your hair, rapid growth, and changes in texture. Some women also have hair loss during or after pregnancy, or hair that feels dry or thin. Your hair will most likely return to normal after your baby is born. WHAT TO EXPECT AT YOUR PRENATAL VISITS During a routine prenatal visit:  You will be weighed to make sure you and the fetus are growing normally.  Your blood pressure will be taken.  Your abdomen will be measured to track your baby's growth.  The fetal heartbeat will be listened to.  Any test results from the previous visit will be discussed. Your health care provider may ask you:  How you are feeling.  If you are feeling the baby move.  If you have had any abnormal symptoms, such as leaking fluid, bleeding, severe headaches, or abdominal cramping.  If you are using any tobacco products, including cigarettes, chewing tobacco, and electronic cigarettes.  If you have any questions. Other tests that may be performed during your second trimester include:  Blood tests that check for:  Low iron levels (anemia).  Gestational diabetes (between 24 and 28 weeks).  Rh antibodies.  Urine tests to check for infections, diabetes, or protein in the urine.  An ultrasound to confirm the proper growth and development of the baby.  An amniocentesis to check for possible genetic problems.  Fetal screens for spina bifida   and Down syndrome.  HIV (human immunodeficiency virus) testing. Routine prenatal testing includes screening for HIV, unless you choose not to have this test. HOME CARE INSTRUCTIONS   Avoid all smoking, herbs, alcohol, and unprescribed drugs. These chemicals affect the formation and growth of the baby.  Do not use any tobacco products, including cigarettes, chewing tobacco, and electronic cigarettes. If you need help quitting, ask your health care provider. You may receive  counseling support and other resources to help you quit.  Follow your health care provider's instructions regarding medicine use. There are medicines that are either safe or unsafe to take during pregnancy.  Exercise only as directed by your health care provider. Experiencing uterine cramps is a good sign to stop exercising.  Continue to eat regular, healthy meals.  Wear a good support bra for breast tenderness.  Do not use hot tubs, steam rooms, or saunas.  Wear your seat belt at all times when driving.  Avoid raw meat, uncooked cheese, cat litter boxes, and soil used by cats. These carry germs that can cause birth defects in the baby.  Take your prenatal vitamins.  Take 1500-2000 mg of calcium daily starting at the 20th week of pregnancy until you deliver your baby.  Try taking a stool softener (if your health care provider approves) if you develop constipation. Eat more high-fiber foods, such as fresh vegetables or fruit and whole grains. Drink plenty of fluids to keep your urine clear or pale yellow.  Take warm sitz baths to soothe any pain or discomfort caused by hemorrhoids. Use hemorrhoid cream if your health care provider approves.  If you develop varicose veins, wear support hose. Elevate your feet for 15 minutes, 3-4 times a day. Limit salt in your diet.  Avoid heavy lifting, wear low heel shoes, and practice good posture.  Rest with your legs elevated if you have leg cramps or low back pain.  Visit your dentist if you have not gone yet during your pregnancy. Use a soft toothbrush to brush your teeth and be gentle when you floss.  A sexual relationship may be continued unless your health care provider directs you otherwise.  Continue to go to all your prenatal visits as directed by your health care provider. SEEK MEDICAL CARE IF:   You have dizziness.  You have mild pelvic cramps, pelvic pressure, or nagging pain in the abdominal area.  You have persistent nausea,  vomiting, or diarrhea.  You have a bad smelling vaginal discharge.  You have pain with urination. SEEK IMMEDIATE MEDICAL CARE IF:   You have a fever.  You are leaking fluid from your vagina.  You have spotting or bleeding from your vagina.  You have severe abdominal cramping or pain.  You have rapid weight gain or loss.  You have shortness of breath with chest pain.  You notice sudden or extreme swelling of your face, hands, ankles, feet, or legs.  You have not felt your baby move in over an hour.  You have severe headaches that do not go away with medicine.  You have vision changes.   This information is not intended to replace advice given to you by your health care provider. Make sure you discuss any questions you have with your health care provider.   Document Released: 05/30/2001 Document Revised: 06/26/2014 Document Reviewed: 08/06/2012 Elsevier Interactive Patient Education 2016 [employer]   Breastfeeding Deciding to breastfeed is one of the best choices you can make for you and your baby. A change   in hormones during pregnancy causes your breast tissue to grow and increases the number and size of your milk ducts. These hormones also allow proteins, sugars, and fats from your blood supply to make breast milk in your milk-producing glands. Hormones prevent breast milk from being released before your baby is born as well as prompt milk flow after birth. Once breastfeeding has begun, thoughts of your baby, as well as his or her sucking or crying, can stimulate the release of milk from your milk-producing glands.  BENEFITS OF BREASTFEEDING For Your Baby  Your first milk (colostrum) helps your baby's digestive system function better.  There are antibodies in your milk that help your baby fight off infections.  Your baby has a lower incidence of asthma, allergies, and sudden infant death syndrome.  The nutrients in breast milk are better for your baby than infant  formulas and are designed uniquely for your baby's needs.  Breast milk improves your baby's brain development.  Your baby is less likely to develop other conditions, such as childhood obesity, asthma, or type 2 diabetes mellitus. For You  Breastfeeding helps to create a very special bond between you and your baby.  Breastfeeding is convenient. Breast milk is always available at the correct temperature and costs nothing.  Breastfeeding helps to burn calories and helps you lose the weight gained during pregnancy.  Breastfeeding makes your uterus contract to its prepregnancy size faster and slows bleeding (lochia) after you give birth.   Breastfeeding helps to lower your risk of developing type 2 diabetes mellitus, osteoporosis, and breast or ovarian cancer later in life. SIGNS THAT YOUR BABY IS HUNGRY Early Signs of Hunger  Increased alertness or activity.  Stretching.  Movement of the head from side to side.  Movement of the head and opening of the mouth when the corner of the mouth or cheek is stroked (rooting).  Increased sucking sounds, smacking lips, cooing, sighing, or squeaking.  Hand-to-mouth movements.  Increased sucking of fingers or hands. Late Signs of Hunger  Fussing.  Intermittent crying. Extreme Signs of Hunger Signs of extreme hunger will require calming and consoling before your baby will be able to breastfeed successfully. Do not wait for the following signs of extreme hunger to occur before you initiate breastfeeding:  Restlessness.  A loud, strong cry.  Screaming. BREASTFEEDING BASICS Breastfeeding Initiation  Find a comfortable place to sit or lie down, with your neck and back well supported.  Place a pillow or rolled up blanket under your baby to bring him or her to the level of your breast (if you are seated). Nursing pillows are specially designed to help support your arms and your baby while you breastfeed.  Make sure that your baby's  abdomen is facing your abdomen.  Gently massage your breast. With your fingertips, massage from your chest wall toward your nipple in a circular motion. This encourages milk flow. You may need to continue this action during the feeding if your milk flows slowly.  Support your breast with 4 fingers underneath and your thumb above your nipple. Make sure your fingers are well away from your nipple and your baby's mouth.  Stroke your baby's lips gently with your finger or nipple.  When your baby's mouth is open wide enough, quickly bring your baby to your breast, placing your entire nipple and as much of the colored area around your nipple (areola) as possible into your baby's mouth.  More areola should be visible above your baby's upper lip than   below the lower lip.  Your baby's tongue should be between his or her lower gum and your breast.  Ensure that your baby's mouth is correctly positioned around your nipple (latched). Your baby's lips should create a seal on your breast and be turned out (everted).  It is common for your baby to suck about 2-3 minutes in order to start the flow of breast milk. Latching Teaching your baby how to latch on to your breast properly is very important. An improper latch can cause nipple pain and decreased milk supply for you and poor weight gain in your baby. Also, if your baby is not latched onto your nipple properly, he or she may swallow some air during feeding. This can make your baby fussy. Burping your baby when you switch breasts during the feeding can help to get rid of the air. However, teaching your baby to latch on properly is still the best way to prevent fussiness from swallowing air while breastfeeding. Signs that your baby has successfully latched on to your nipple:  Silent tugging or silent sucking, without causing you pain.  Swallowing heard between every 3-4 sucks.  Muscle movement above and in front of his or her ears while sucking. Signs  that your baby has not successfully latched on to nipple:  Sucking sounds or smacking sounds from your baby while breastfeeding.  Nipple pain. If you think your baby has not latched on correctly, slip your finger into the corner of your baby's mouth to break the suction and place it between your baby's gums. Attempt breastfeeding initiation again. Signs of Successful Breastfeeding Signs from your baby:  A gradual decrease in the number of sucks or complete cessation of sucking.  Falling asleep.  Relaxation of his or her body.  Retention of a small amount of milk in his or her mouth.  Letting go of your breast by himself or herself. Signs from you:  Breasts that have increased in firmness, weight, and size 1-3 hours after feeding.  Breasts that are softer immediately after breastfeeding.  Increased milk volume, as well as a change in milk consistency and color by the fifth day of breastfeeding.  Nipples that are not sore, cracked, or bleeding. Signs That Your Baby is Getting Enough Milk  Wetting at least 3 diapers in a 24-hour period. The urine should be clear and pale yellow by age 5 days.  At least 3 stools in a 24-hour period by age 5 days. The stool should be soft and yellow.  At least 3 stools in a 24-hour period by age 7 days. The stool should be seedy and yellow.  No loss of weight greater than 10% of birth weight during the first 3 days of age.  Average weight gain of 4-7 ounces (113-198 g) per week after age 4 days.  Consistent daily weight gain by age 5 days, without weight loss after the age of 2 weeks. After a feeding, your baby may spit up a small amount. This is common. BREASTFEEDING FREQUENCY AND DURATION Frequent feeding will help you make more milk and can prevent sore nipples and breast engorgement. Breastfeed when you feel the need to reduce the fullness of your breasts or when your baby shows signs of hunger. This is called "breastfeeding on demand." Avoid  introducing a pacifier to your baby while you are working to establish breastfeeding (the first 4-6 weeks after your baby is born). After this time you may choose to use a pacifier. Research has shown that   pacifier use during the first year of a baby's life decreases the risk of sudden infant death syndrome (SIDS). Allow your baby to feed on each breast as long as he or she wants. Breastfeed until your baby is finished feeding. When your baby unlatches or falls asleep while feeding from the first breast, offer the second breast. Because newborns are often sleepy in the first few weeks of life, you may need to awaken your baby to get him or her to feed. Breastfeeding times will vary from baby to baby. However, the following rules can serve as a guide to help you ensure that your baby is properly fed:  Newborns (babies 4 weeks of age or younger) may breastfeed every 1-3 hours.  Newborns should not go longer than 3 hours during the day or 5 hours during the night without breastfeeding.  You should breastfeed your baby a minimum of 8 times in a 24-hour period until you begin to introduce solid foods to your baby at around 6 months of age. BREAST MILK PUMPING Pumping and storing breast milk allows you to ensure that your baby is exclusively fed your breast milk, even at times when you are unable to breastfeed. This is especially important if you are going back to work while you are still breastfeeding or when you are not able to be present during feedings. Your lactation consultant can give you guidelines on how long it is safe to store breast milk. A breast pump is a machine that allows you to pump milk from your breast into a sterile bottle. The pumped breast milk can then be stored in a refrigerator or freezer. Some breast pumps are operated by hand, while others use electricity. Ask your lactation consultant which type will work best for you. Breast pumps can be purchased, but some hospitals and  breastfeeding support groups lease breast pumps on a monthly basis. A lactation consultant can teach you how to hand express breast milk, if you prefer not to use a pump. CARING FOR YOUR BREASTS WHILE YOU BREASTFEED Nipples can become dry, cracked, and sore while breastfeeding. The following recommendations can help keep your breasts moisturized and healthy:  Avoid using soap on your nipples.  Wear a supportive bra. Although not required, special nursing bras and tank tops are designed to allow access to your breasts for breastfeeding without taking off your entire bra or top. Avoid wearing underwire-style bras or extremely tight bras.  Air dry your nipples for 3-4minutes after each feeding.  Use only cotton bra pads to absorb leaked breast milk. Leaking of breast milk between feedings is normal.  Use lanolin on your nipples after breastfeeding. Lanolin helps to maintain your skin's normal moisture barrier. If you use pure lanolin, you do not need to wash it off before feeding your baby again. Pure lanolin is not toxic to your baby. You may also hand express a few drops of breast milk and gently massage that milk into your nipples and allow the milk to air dry. In the first few weeks after giving birth, some women experience extremely full breasts (engorgement). Engorgement can make your breasts feel heavy, warm, and tender to the touch. Engorgement peaks within 3-5 days after you give birth. The following recommendations can help ease engorgement:  Completely empty your breasts while breastfeeding or pumping. You may want to start by applying warm, moist heat (in the shower or with warm water-soaked hand towels) just before feeding or pumping. This increases circulation and helps the milk   flow. If your baby does not completely empty your breasts while breastfeeding, pump any extra milk after he or she is finished.  Wear a snug bra (nursing or regular) or tank top for 1-2 days to signal your body  to slightly decrease milk production.  Apply ice packs to your breasts, unless this is too uncomfortable for you.  Make sure that your baby is latched on and positioned properly while breastfeeding. If engorgement persists after 48 hours of following these recommendations, contact your health care provider or a lactation consultant. OVERALL HEALTH CARE RECOMMENDATIONS WHILE BREASTFEEDING  Eat healthy foods. Alternate between meals and snacks, eating 3 of each per day. Because what you eat affects your breast milk, some of the foods may make your baby more irritable than usual. Avoid eating these foods if you are sure that they are negatively affecting your baby.  Drink milk, fruit juice, and water to satisfy your thirst (about 10 glasses a day).  Rest often, relax, and continue to take your prenatal vitamins to prevent fatigue, stress, and anemia.  Continue breast self-awareness checks.  Avoid chewing and smoking tobacco. Chemicals from cigarettes that pass into breast milk and exposure to secondhand smoke may harm your baby.  Avoid alcohol and drug use, including marijuana. Some medicines that may be harmful to your baby can pass through breast milk. It is important to ask your health care provider before taking any medicine, including all over-the-counter and prescription medicine as well as vitamin and herbal supplements. It is possible to become pregnant while breastfeeding. If birth control is desired, ask your health care provider about options that will be safe for your baby. SEEK MEDICAL CARE IF:  You feel like you want to stop breastfeeding or have become frustrated with breastfeeding.  You have painful breasts or nipples.  Your nipples are cracked or bleeding.  Your breasts are red, tender, or warm.  You have a swollen area on either breast.  You have a fever or chills.  You have nausea or vomiting.  You have drainage other than breast milk from your nipples.  Your  breasts do not become full before feedings by the fifth day after you give birth.  You feel sad and depressed.  Your baby is too sleepy to eat well.  Your baby is having trouble sleeping.   Your baby is wetting less than 3 diapers in a 24-hour period.  Your baby has less than 3 stools in a 24-hour period.  Your baby's skin or the white part of his or her eyes becomes yellow.   Your baby is not gaining weight by 5 days of age. SEEK IMMEDIATE MEDICAL CARE IF:  Your baby is overly tired (lethargic) and does not want to wake up and feed.  Your baby develops an unexplained fever.   This information is not intended to replace advice given to you by your health care provider. Make sure you discuss any questions you have with your health care provider.   Document Released: 06/05/2005 Document Revised: 02/24/2015 Document Reviewed: 11/27/2012 Elsevier Interactive Patient Education 2016 [employer]  

## 2015-05-31 NOTE — Progress Notes (Signed)
Bedside ultrasound today shows fetus with heartbeat around 15-16 weeks

## 2015-05-31 NOTE — Progress Notes (Signed)
   Subjective:    Nicole Dillon is a G2P1001 5946w3d being seen today for her first obstetrical visit.  Her obstetrical history is significant for obesity and short interval between pregnancies.  Pregnancy history fully reviewed.  Patient reports no complaints.  Filed Vitals:   05/31/15 1526  BP: 108/72  Pulse: 121  Weight: 265 lb (120.203 kg)    HISTORY: OB History  Gravida Para Term Preterm AB SAB TAB Ectopic Multiple Living  2 1 1       0 1    # Outcome Date GA Lbr Len/2nd Weight Sex Delivery Anes PTL Lv  2 Current           1 Term 11/08/14 6061w1d / 00:18 7 lb 13.6 oz (3.56 kg) F Vag-Spont None  Y     Past Medical History  Diagnosis Date  . 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activity   . Asthma   . Seasonal allergies   . Insomnia   . Irregular heart beat    Past Surgical History  Procedure Laterality Date  . Tooth extraction     Family History  Problem Relation Age of Onset  . Diabetes Mother   . Diabetes Father   . Kidney disease Father     dialysis  . Learning disabilities Maternal Uncle     speach impediment  . Heart disease Maternal Grandmother   . Heart disease Maternal Grandfather      Exam   Blood pressure 108/72, pulse 121, weight 265 lb (120.203 kg)  Physical Examination: General appearance - alert, well appearing, and in no distress Eyes - sclera anicteric Mouth - mucous membranes moist, pharynx normal without lesions Neck - supple, no significant adenopathy Chest - clear to auscultation, no wheezes, rales or rhonchi, symmetric air entry Heart - normal rate, regular rhythm, normal S1, S2, no murmurs, rubs, clicks or gallops Abdomen - soft, nontender, nondistended, no masses or organomegaly Neurological - alert, oriented, normal speech, no focal findings or movement disorder noted Musculoskeletal - no joint tenderness, deformity or swelling Extremities - peripheral pulses normal, no pedal edema, no clubbing or cyanosis Skin - normal  coloration and turgor, no rashes, no suspicious skin lesions noted  Abd U/s confirms SIUP approx. 15 wks, + fetal movement, + FHR   Assessment/Plan:    Pregnancy: G2P1001 1. Supervision of normal pregnancy, second trimester Begin routing care - Urine cytology ancillary only - Prenatal Profile - Culture, OB Urine - US MFM OB COMP + 14 WK; Future - AFP, Quad Screen - Glucose Tolerance, 1 HR (50g) - Flu Vaccine QUAD 36+ mos IM; Standing - Bedside U/S  2. Other allergic rhinitis - cetirizine (ZYRTEC ALLERGY) 10 MG tablet; Take 1 tablet (10 mg total) by mouth daily.  Dispense: 30 tablet; Refill: 3  3. Asthma, mild intermittent, uncomplicated On inhaler, uses intermittently   PRATT,TANYA S 05/31/2015

## 2015-06-01 ENCOUNTER — Encounter: Payer: Medicaid Other | Admitting: Obstetrics & Gynecology

## 2015-06-01 LAB — URINE CYTOLOGY ANCILLARY ONLY
Chlamydia: NEGATIVE
Neisseria Gonorrhea: NEGATIVE

## 2015-06-01 LAB — PRENATAL PROFILE (SOLSTAS)
ANTIBODY SCREEN: NEGATIVE
BASOS ABS: 0 10*3/uL (ref 0.0–0.1)
Basophils Relative: 0 % (ref 0–1)
EOS PCT: 1 % (ref 0–5)
Eosinophils Absolute: 0.1 10*3/uL (ref 0.0–0.7)
HEMATOCRIT: 38.7 % (ref 36.0–46.0)
HIV: NONREACTIVE
Hemoglobin: 12.4 g/dL (ref 12.0–15.0)
Hepatitis B Surface Ag: NEGATIVE
LYMPHS PCT: 25 % (ref 12–46)
Lymphs Abs: 2.4 10*3/uL (ref 0.7–4.0)
MCH: 29.2 pg (ref 26.0–34.0)
MCHC: 32 g/dL (ref 30.0–36.0)
MCV: 91.3 fL (ref 78.0–100.0)
MPV: 9.6 fL (ref 8.6–12.4)
Monocytes Absolute: 0.5 10*3/uL (ref 0.1–1.0)
Monocytes Relative: 5 % (ref 3–12)
NEUTROS PCT: 69 % (ref 43–77)
Neutro Abs: 6.6 10*3/uL (ref 1.7–7.7)
Platelets: 300 10*3/uL (ref 150–400)
RBC: 4.24 MIL/uL (ref 3.87–5.11)
RDW: 14 % (ref 11.5–15.5)
Rh Type: NEGATIVE
Rubella: 4.54 Index — ABNORMAL HIGH (ref <0.90)
WBC: 9.5 10*3/uL (ref 4.0–10.5)

## 2015-06-01 LAB — GLUCOSE TOLERANCE, 1 HOUR (50G) W/O FASTING: Glucose, 1 Hour GTT: 87 mg/dL (ref 70–140)

## 2015-06-02 LAB — CULTURE, OB URINE: Colony Count: 60000

## 2015-06-03 ENCOUNTER — Encounter: Payer: Self-pay | Admitting: *Deleted

## 2015-06-07 ENCOUNTER — Telehealth: Payer: Self-pay | Admitting: *Deleted

## 2015-06-07 ENCOUNTER — Encounter: Payer: Self-pay | Admitting: Family Medicine

## 2015-06-07 DIAGNOSIS — O280 Abnormal hematological finding on antenatal screening of mother: Secondary | ICD-10-CM

## 2015-06-07 LAB — AFP, QUAD SCREEN
AFP: 14.4 ng/mL
Curr Gest Age: 15.3 wks.days
HCG TOTAL: 155.62 [IU]/mL
INH: 477.3 pg/mL
Interpretation-AFP: POSITIVE — AB
MOM FOR AFP: 0.74
MoM for INH: 3.74
MoM for hCG: 4.86
OPEN SPINA BIFIDA: NEGATIVE
Osb Risk: <1:27300 {titer}
Tri 18 Scr Risk Est: NEGATIVE
uE3 Mom: 0.37
uE3 Value: 0.23 ng/mL

## 2015-06-07 NOTE — Telephone Encounter (Signed)
-----   Message from Reva Boresanya S Pratt, MD sent at 06/07/2015  1:15 PM EST ----- Positive quad--refer to genetics.

## 2015-06-07 NOTE — Telephone Encounter (Signed)
-----   Message from Tanya S Pratt, MD sent at 06/07/2015  1:15 PM EST ----- Positive quad--refer to genetics. 

## 2015-06-07 NOTE — Telephone Encounter (Signed)
Left message with a gentleman who answered her phone for pt to call the office.

## 2015-06-07 NOTE — Telephone Encounter (Signed)
Informed pt of AFP result and the recommendation for genetic counseling, pt acknowledged, will schedule appt with MFM.

## 2015-06-08 ENCOUNTER — Telehealth: Payer: Self-pay

## 2015-06-08 NOTE — Telephone Encounter (Signed)
Call to notify Nicole Dillon of appt made with genetic counselor, no answer, left message instructing her to return my call here at the office

## 2015-06-20 NOTE — L&D Delivery Note (Signed)
Delivery Note At 12:31 PM a viable female infant was delivered via Vaginal, Spontaneous Delivery (Presentation: LOA ;  ).  APGAR: 8, 9; weight 7 lb 5.1 oz (3320 g).   Placenta status: intact, spontaneous, .  Cord:  3 vessel    Anesthesia: None  Episiotomy: None Lacerations:  None Est. Blood Loss (mL):  300mL  Mom to postpartum.  Baby to Couplet care / Skin to Skin.  Leonette MostJohanna K Halfon 12/11/2015, 12:43 PM

## 2015-06-22 ENCOUNTER — Other Ambulatory Visit: Payer: Self-pay | Admitting: Family Medicine

## 2015-06-22 ENCOUNTER — Encounter (HOSPITAL_COMMUNITY): Payer: Self-pay

## 2015-06-22 ENCOUNTER — Ambulatory Visit (HOSPITAL_COMMUNITY)
Admission: RE | Admit: 2015-06-22 | Discharge: 2015-06-22 | Disposition: A | Discharge status: Home / Self Care | Payer: Medicaid Other | Source: Ambulatory Visit | Attending: Family Medicine | Admitting: Family Medicine

## 2015-06-22 ENCOUNTER — Ambulatory Visit (HOSPITAL_COMMUNITY): Payer: Medicaid Other

## 2015-06-22 VITALS — BP 97/66 | HR 103 | Wt 269.6 lb

## 2015-06-22 DIAGNOSIS — O26892 Other specified pregnancy related conditions, second trimester: Secondary | ICD-10-CM | POA: Insufficient documentation

## 2015-06-22 DIAGNOSIS — O99332 Smoking (tobacco) complicating pregnancy, second trimester: Secondary | ICD-10-CM | POA: Diagnosis not present

## 2015-06-22 DIAGNOSIS — Z6791 Unspecified blood type, Rh negative: Secondary | ICD-10-CM | POA: Diagnosis not present

## 2015-06-22 DIAGNOSIS — Z3A15 15 weeks gestation of pregnancy: Secondary | ICD-10-CM | POA: Insufficient documentation

## 2015-06-22 DIAGNOSIS — Z3492 Encounter for supervision of normal pregnancy, unspecified, second trimester: Secondary | ICD-10-CM

## 2015-06-22 DIAGNOSIS — O283 Abnormal ultrasonic finding on antenatal screening of mother: Secondary | ICD-10-CM | POA: Diagnosis not present

## 2015-06-22 DIAGNOSIS — O99212 Obesity complicating pregnancy, second trimester: Secondary | ICD-10-CM | POA: Insufficient documentation

## 2015-06-22 DIAGNOSIS — O280 Abnormal hematological finding on antenatal screening of mother: Secondary | ICD-10-CM

## 2015-06-29 ENCOUNTER — Ambulatory Visit (INDEPENDENT_AMBULATORY_CARE_PROVIDER_SITE_OTHER): Payer: Medicaid Other | Admitting: Family Medicine

## 2015-06-29 VITALS — BP 132/82 | HR 66 | Wt 269.0 lb

## 2015-06-29 DIAGNOSIS — Z3482 Encounter for supervision of other normal pregnancy, second trimester: Secondary | ICD-10-CM

## 2015-06-29 DIAGNOSIS — Z3492 Encounter for supervision of normal pregnancy, unspecified, second trimester: Secondary | ICD-10-CM

## 2015-06-29 DIAGNOSIS — Z36 Encounter for antenatal screening of mother: Secondary | ICD-10-CM

## 2015-06-29 DIAGNOSIS — O360131 Maternal care for anti-D [Rh] antibodies, third trimester, fetus 1: Secondary | ICD-10-CM

## 2015-06-29 NOTE — Progress Notes (Signed)
Subjective:  Nicole Dillon is a 20 y.o. G2P1001 at 4842w6d being seen today for ongoing prenatal care.  She is currently monitored for the following issues for this low-risk pregnancy and has Supervision of normal pregnancy; Rh negative state in antepartum period; Obesity in pregnancy, antepartum; Asthma, mild intermittent; and Other allergic rhinitis on her problem list.  Patient reports no complaints.  Contractions: Not present. Vag. Bleeding: None.  Movement: Absent. Denies leaking of fluid.   The following portions of the patient's history were reviewed and updated as appropriate: allergies, current medications, past family history, past medical history, past social history, past surgical history and problem list. Problem list updated.  Objective:   Filed Vitals:   06/29/15 1107  BP: 132/82  Pulse: 66  Weight: 269 lb (122.018 kg)    Fetal Status: Fetal Heart Rate (bpm): 143 Fundal Height: 20 cm Movement: Absent     General:  Alert, oriented and cooperative. Patient is in no acute distress.  Skin: Skin is warm and dry. No rash noted.   Cardiovascular: Normal heart rate noted  Respiratory: Normal respiratory effort, no problems with respiration noted  Abdomen: Soft, gravid, appropriate for gestational age. Pain/Pressure: Absent     Pelvic: Vag. Bleeding: None Vag D/C Character: Thin   Cervical exam deferred        Extremities: Normal range of motion.  Edema: None  Mental Status: Normal mood and affect. Normal behavior. Normal judgment and thought content.   Urinalysis: Urine Protein: Negative Urine Glucose: Negative  Assessment and Plan:  Pregnancy: G2P1001 at 942w6d  1. Supervision of normal pregnancy, second trimester Continue routine prenatal care. Has anatomy u/s scheduled  - AFP, Quad Screen  2. Rh negative state in antepartum period, third trimester, fetus 1 Will need Rhogam  General obstetric precautions including but not limited to vaginal bleeding, contractions,  leaking of fluid and fetal movement were reviewed in detail with the patient. Please refer to After Visit Summary for other counseling recommendations.  Return in 4 weeks (on 07/27/2015).   Reva Boresanya S Ronnell Makarewicz, MD

## 2015-06-29 NOTE — Patient Instructions (Addendum)
You can take Mucinex, (Allegra, Claritin, Zyrtec-any one of these--they are the same class--same as Benadryl), sudafed, saline nasal spray, Nasonex or Flonase  Second Trimester of Pregnancy The second trimester is from week 13 through week 28, months 4 through 6. The second trimester is often a time when you feel your best. Your body has also adjusted to being pregnant, and you begin to feel better physically. Usually, morning sickness has lessened or quit completely, you may have more energy, and you may have an increase in appetite. The second trimester is also a time when the fetus is growing rapidly. At the end of the sixth month, the fetus is about 9 inches long and weighs about 1 pounds. You will likely begin to feel the baby move (quickening) between 18 and 20 weeks of the pregnancy. BODY CHANGES Your body goes through many changes during pregnancy. The changes vary from woman to woman.   Your weight will continue to increase. You will notice your lower abdomen bulging out.  You may begin to get stretch marks on your hips, abdomen, and breasts.  You may develop headaches that can be relieved by medicines approved by your health care provider.  You may urinate more often because the fetus is pressing on your bladder.  You may develop or continue to have heartburn as a result of your pregnancy.  You may develop constipation because certain hormones are causing the muscles that push waste through your intestines to slow down.  You may develop hemorrhoids or swollen, bulging veins (varicose veins).  You may have back pain because of the weight gain and pregnancy hormones relaxing your joints between the bones in your pelvis and as a result of a shift in weight and the muscles that support your balance.  Your breasts will continue to grow and be tender.  Your gums may bleed and may be sensitive to brushing and flossing.  Dark spots or blotches (chloasma, mask of pregnancy) may develop  on your face. This will likely fade after the baby is born.  A dark line from your belly button to the pubic area (linea nigra) may appear. This will likely fade after the baby is born.  You may have changes in your hair. These can include thickening of your hair, rapid growth, and changes in texture. Some women also have hair loss during or after pregnancy, or hair that feels dry or thin. Your hair will most likely return to normal after your baby is born. WHAT TO EXPECT AT YOUR PRENATAL VISITS During a routine prenatal visit:  You will be weighed to make sure you and the fetus are growing normally.  Your blood pressure will be taken.  Your abdomen will be measured to track your baby's growth.  The fetal heartbeat will be listened to.  Any test results from the previous visit will be discussed. Your health care provider may ask you:  How you are feeling.  If you are feeling the baby move.  If you have had any abnormal symptoms, such as leaking fluid, bleeding, severe headaches, or abdominal cramping.  If you are using any tobacco products, including cigarettes, chewing tobacco, and electronic cigarettes.  If you have any questions. Other tests that may be performed during your second trimester include:  Blood tests that check for:  Low iron levels (anemia).  Gestational diabetes (between 24 and 28 weeks).  Rh antibodies.  Urine tests to check for infections, diabetes, or protein in the urine.  An ultrasound to  confirm the proper growth and development of the baby.  An amniocentesis to check for possible genetic problems.  Fetal screens for spina bifida and Down syndrome.  HIV (human immunodeficiency virus) testing. Routine prenatal testing includes screening for HIV, unless you choose not to have this test. HOME CARE INSTRUCTIONS   Avoid all smoking, herbs, alcohol, and unprescribed drugs. These chemicals affect the formation and growth of the baby.  Do not use any  tobacco products, including cigarettes, chewing tobacco, and electronic cigarettes. If you need help quitting, ask your health care provider. You may receive counseling support and other resources to help you quit.  Follow your health care provider's instructions regarding medicine use. There are medicines that are either safe or unsafe to take during pregnancy.  Exercise only as directed by your health care provider. Experiencing uterine cramps is a good sign to stop exercising.  Continue to eat regular, healthy meals.  Wear a good support bra for breast tenderness.  Do not use hot tubs, steam rooms, or saunas.  Wear your seat belt at all times when driving.  Avoid raw meat, uncooked cheese, cat litter boxes, and soil used by cats. These carry germs that can cause birth defects in the baby.  Take your prenatal vitamins.  Take 1500-2000 mg of calcium daily starting at the 20th week of pregnancy until you deliver your baby.  Try taking a stool softener (if your health care provider approves) if you develop constipation. Eat more high-fiber foods, such as fresh vegetables or fruit and whole grains. Drink plenty of fluids to keep your urine clear or pale yellow.  Take warm sitz baths to soothe any pain or discomfort caused by hemorrhoids. Use hemorrhoid cream if your health care provider approves.  If you develop varicose veins, wear support hose. Elevate your feet for 15 minutes, 3-4 times a day. Limit salt in your diet.  Avoid heavy lifting, wear low heel shoes, and practice good posture.  Rest with your legs elevated if you have leg cramps or low back pain.  Visit your dentist if you have not gone yet during your pregnancy. Use a soft toothbrush to brush your teeth and be gentle when you floss.  A sexual relationship may be continued unless your health care provider directs you otherwise.  Continue to go to all your prenatal visits as directed by your health care provider. SEEK  MEDICAL CARE IF:   You have dizziness.  You have mild pelvic cramps, pelvic pressure, or nagging pain in the abdominal area.  You have persistent nausea, vomiting, or diarrhea.  You have a bad smelling vaginal discharge.  You have pain with urination. SEEK IMMEDIATE MEDICAL CARE IF:   You have a fever.  You are leaking fluid from your vagina.  You have spotting or bleeding from your vagina.  You have severe abdominal cramping or pain.  You have rapid weight gain or loss.  You have shortness of breath with chest pain.  You notice sudden or extreme swelling of your face, hands, ankles, feet, or legs.  You have not felt your baby move in over an hour.  You have severe headaches that do not go away with medicine.  You have vision changes.   This information is not intended to replace advice given to you by your health care provider. Make sure you discuss any questions you have with your health care provider.   Document Released: 05/30/2001 Document Revised: 06/26/2014 Document Reviewed: 08/06/2012 Elsevier Interactive Patient Education 2016  [employer].  Breastfeeding Deciding to breastfeed is one of the best choices you can make for you and your baby. A change in hormones during pregnancy causes your breast tissue to grow and increases the number and size of your milk ducts. These hormones also allow proteins, sugars, and fats from your blood supply to make breast milk in your milk-producing glands. Hormones prevent breast milk from being released before your baby is born as well as prompt milk flow after birth. Once breastfeeding has begun, thoughts of your baby, as well as his or her sucking or crying, can stimulate the release of milk from your milk-producing glands.  BENEFITS OF BREASTFEEDING For Your Baby  Your first milk (colostrum) helps your baby's digestive system function better.  There are antibodies in your milk that help your baby fight off infections.  Your  baby has a lower incidence of asthma, allergies, and sudden infant death syndrome.  The nutrients in breast milk are better for your baby than infant formulas and are designed uniquely for your baby's needs.  Breast milk improves your baby's brain development.  Your baby is less likely to develop other conditions, such as childhood obesity, asthma, or type 2 diabetes mellitus. For You  Breastfeeding helps to create a very special bond between you and your baby.  Breastfeeding is convenient. Breast milk is always available at the correct temperature and costs nothing.  Breastfeeding helps to burn calories and helps you lose the weight gained during pregnancy.  Breastfeeding makes your uterus contract to its prepregnancy size faster and slows bleeding (lochia) after you give birth.   Breastfeeding helps to lower your risk of developing type 2 diabetes mellitus, osteoporosis, and breast or ovarian cancer later in life. SIGNS THAT YOUR BABY IS HUNGRY Early Signs of Hunger  Increased alertness or activity.  Stretching.  Movement of the head from side to side.  Movement of the head and opening of the mouth when the corner of the mouth or cheek is stroked (rooting).  Increased sucking sounds, smacking lips, cooing, sighing, or squeaking.  Hand-to-mouth movements.  Increased sucking of fingers or hands. Late Signs of Hunger  Fussing.  Intermittent crying. Extreme Signs of Hunger Signs of extreme hunger will require calming and consoling before your baby will be able to breastfeed successfully. Do not wait for the following signs of extreme hunger to occur before you initiate breastfeeding:  Restlessness.  A loud, strong cry.  Screaming. BREASTFEEDING BASICS Breastfeeding Initiation  Find a comfortable place to sit or lie down, with your neck and back well supported.  Place a pillow or rolled up blanket under your baby to bring him or her to the level of your breast (if  you are seated). Nursing pillows are specially designed to help support your arms and your baby while you breastfeed.  Make sure that your baby's abdomen is facing your abdomen.  Gently massage your breast. With your fingertips, massage from your chest wall toward your nipple in a circular motion. This encourages milk flow. You may need to continue this action during the feeding if your milk flows slowly.  Support your breast with 4 fingers underneath and your thumb above your nipple. Make sure your fingers are well away from your nipple and your baby's mouth.  Stroke your baby's lips gently with your finger or nipple.  When your baby's mouth is open wide enough, quickly bring your baby to your breast, placing your entire nipple and as much of the colored  area around your nipple (areola) as possible into your baby's mouth.  More areola should be visible above your baby's upper lip than below the lower lip.  Your baby's tongue should be between his or her lower gum and your breast.  Ensure that your baby's mouth is correctly positioned around your nipple (latched). Your baby's lips should create a seal on your breast and be turned out (everted).  It is common for your baby to suck about 2-3 minutes in order to start the flow of breast milk. Latching Teaching your baby how to latch on to your breast properly is very important. An improper latch can cause nipple pain and decreased milk supply for you and poor weight gain in your baby. Also, if your baby is not latched onto your nipple properly, he or she may swallow some air during feeding. This can make your baby fussy. Burping your baby when you switch breasts during the feeding can help to get rid of the air. However, teaching your baby to latch on properly is still the best way to prevent fussiness from swallowing air while breastfeeding. Signs that your baby has successfully latched on to your nipple:  Silent tugging or silent sucking,  without causing you pain.  Swallowing heard between every 3-4 sucks.  Muscle movement above and in front of his or her ears while sucking. Signs that your baby has not successfully latched on to nipple:  Sucking sounds or smacking sounds from your baby while breastfeeding.  Nipple pain. If you think your baby has not latched on correctly, slip your finger into the corner of your baby's mouth to break the suction and place it between your baby's gums. Attempt breastfeeding initiation again. Signs of Successful Breastfeeding Signs from your baby:  A gradual decrease in the number of sucks or complete cessation of sucking.  Falling asleep.  Relaxation of his or her body.  Retention of a small amount of milk in his or her mouth.  Letting go of your breast by himself or herself. Signs from you:  Breasts that have increased in firmness, weight, and size 1-3 hours after feeding.  Breasts that are softer immediately after breastfeeding.  Increased milk volume, as well as a change in milk consistency and color by the fifth day of breastfeeding.  Nipples that are not sore, cracked, or bleeding. Signs That Your Pecola Leisure is Getting Enough Milk  Wetting at least 3 diapers in a 24-hour period. The urine should be clear and pale yellow by age 66 days.  At least 3 stools in a 24-hour period by age 66 days. The stool should be soft and yellow.  At least 3 stools in a 24-hour period by age 15 days. The stool should be seedy and yellow.  No loss of weight greater than 10% of birth weight during the first 21 days of age.  Average weight gain of 4-7 ounces (113-198 g) per week after age 79 days.  Consistent daily weight gain by age 66 days, without weight loss after the age of 2 weeks. After a feeding, your baby may spit up a small amount. This is common. BREASTFEEDING FREQUENCY AND DURATION Frequent feeding will help you make more milk and can prevent sore nipples and breast engorgement. Breastfeed  when you feel the need to reduce the fullness of your breasts or when your baby shows signs of hunger. This is called "breastfeeding on demand." Avoid introducing a pacifier to your baby while you are working to establish breastfeeding (  the first 4-6 weeks after your baby is born). After this time you may choose to use a pacifier. Research has shown that pacifier use during the first year of a baby's life decreases the risk of sudden infant death syndrome (SIDS). Allow your baby to feed on each breast as long as he or she wants. Breastfeed until your baby is finished feeding. When your baby unlatches or falls asleep while feeding from the first breast, offer the second breast. Because newborns are often sleepy in the first few weeks of life, you may need to awaken your baby to get him or her to feed. Breastfeeding times will vary from baby to baby. However, the following rules can serve as a guide to help you ensure that your baby is properly fed:  Newborns (babies 94 weeks of age or younger) may breastfeed every 1-3 hours.  Newborns should not go longer than 3 hours during the day or 5 hours during the night without breastfeeding.  You should breastfeed your baby a minimum of 8 times in a 24-hour period until you begin to introduce solid foods to your baby at around 606 months of age. BREAST MILK PUMPING Pumping and storing breast milk allows you to ensure that your baby is exclusively fed your breast milk, even at times when you are unable to breastfeed. This is especially important if you are going back to work while you are still breastfeeding or when you are not able to be present during feedings. Your lactation consultant can give you guidelines on how long it is safe to store breast milk. A breast pump is a machine that allows you to pump milk from your breast into a sterile bottle. The pumped breast milk can then be stored in a refrigerator or freezer. Some breast pumps are operated by hand, while  others use electricity. Ask your lactation consultant which type will work best for you. Breast pumps can be purchased, but some hospitals and breastfeeding support groups lease breast pumps on a monthly basis. A lactation consultant can teach you how to hand express breast milk, if you prefer not to use a pump. CARING FOR YOUR BREASTS WHILE YOU BREASTFEED Nipples can become dry, cracked, and sore while breastfeeding. The following recommendations can help keep your breasts moisturized and healthy:  Avoid using soap on your nipples.  Wear a supportive bra. Although not required, special nursing bras and tank tops are designed to allow access to your breasts for breastfeeding without taking off your entire bra or top. Avoid wearing underwire-style bras or extremely tight bras.  Air dry your nipples for 3-284minutes after each feeding.  Use only cotton bra pads to absorb leaked breast milk. Leaking of breast milk between feedings is normal.  Use lanolin on your nipples after breastfeeding. Lanolin helps to maintain your skin's normal moisture barrier. If you use pure lanolin, you do not need to wash it off before feeding your baby again. Pure lanolin is not toxic to your baby. You may also hand express a few drops of breast milk and gently massage that milk into your nipples and allow the milk to air dry. In the first few weeks after giving birth, some women experience extremely full breasts (engorgement). Engorgement can make your breasts feel heavy, warm, and tender to the touch. Engorgement peaks within 3-5 days after you give birth. The following recommendations can help ease engorgement:  Completely empty your breasts while breastfeeding or pumping. You may want to start by applying warm,  moist heat (in the shower or with warm water-soaked hand towels) just before feeding or pumping. This increases circulation and helps the milk flow. If your baby does not completely empty your breasts while  breastfeeding, pump any extra milk after he or she is finished.  Wear a snug bra (nursing or regular) or tank top for 1-2 days to signal your body to slightly decrease milk production.  Apply ice packs to your breasts, unless this is too uncomfortable for you.  Make sure that your baby is latched on and positioned properly while breastfeeding. If engorgement persists after 48 hours of following these recommendations, contact your health care provider or a Advertising copywriter. OVERALL HEALTH CARE RECOMMENDATIONS WHILE BREASTFEEDING  Eat healthy foods. Alternate between meals and snacks, eating 3 of each per day. Because what you eat affects your breast milk, some of the foods may make your baby more irritable than usual. Avoid eating these foods if you are sure that they are negatively affecting your baby.  Drink milk, fruit juice, and water to satisfy your thirst (about 10 glasses a day).  Rest often, relax, and continue to take your prenatal vitamins to prevent fatigue, stress, and anemia.  Continue breast self-awareness checks.  Avoid chewing and smoking tobacco. Chemicals from cigarettes that pass into breast milk and exposure to secondhand smoke may harm your baby.  Avoid alcohol and drug use, including marijuana. Some medicines that may be harmful to your baby can pass through breast milk. It is important to ask your health care provider before taking any medicine, including all over-the-counter and prescription medicine as well as vitamin and herbal supplements. It is possible to become pregnant while breastfeeding. If birth control is desired, ask your health care provider about options that will be safe for your baby. SEEK MEDICAL CARE IF:  You feel like you want to stop breastfeeding or have become frustrated with breastfeeding.  You have painful breasts or nipples.  Your nipples are cracked or bleeding.  Your breasts are red, tender, or warm.  You have a swollen area on  either breast.  You have a fever or chills.  You have nausea or vomiting.  You have drainage other than breast milk from your nipples.  Your breasts do not become full before feedings by the fifth day after you give birth.  You feel sad and depressed.  Your baby is too sleepy to eat well.  Your baby is having trouble sleeping.   Your baby is wetting less than 3 diapers in a 24-hour period.  Your baby has less than 3 stools in a 24-hour period.  Your baby's skin or the white part of his or her eyes becomes yellow.   Your baby is not gaining weight by 24 days of age. SEEK IMMEDIATE MEDICAL CARE IF:  Your baby is overly tired (lethargic) and does not want to wake up and feed.  Your baby develops an unexplained fever.   This information is not intended to replace advice given to you by your health care provider. Make sure you discuss any questions you have with your health care provider.   Document Released: 06/05/2005 Document Revised: 02/24/2015 Document Reviewed: 11/27/2012 Elsevier Interactive Patient Education [employer]

## 2015-07-02 LAB — AFP, QUAD SCREEN
AFP: 31.4 ng/mL
CURR GEST AGE: 16.6 wks.days
Down Syndrome Scr Risk Est: 1:13400 {titer}
HCG TOTAL: 49.71 [IU]/mL
INH: 206.9 pg/mL
Interpretation-AFP: NEGATIVE
MOM FOR AFP: 1.14
MOM FOR HCG: 2
MoM for INH: 1.66
Open Spina bifida: NEGATIVE
Osb Risk: 1:16600 {titer}
Tri 18 Scr Risk Est: NEGATIVE
UE3 VALUE: 1.11 ng/mL
uE3 Mom: 1.28

## 2015-07-27 ENCOUNTER — Ambulatory Visit (INDEPENDENT_AMBULATORY_CARE_PROVIDER_SITE_OTHER): Payer: Medicaid Other | Admitting: Obstetrics & Gynecology

## 2015-07-27 VITALS — BP 95/52 | HR 139 | Wt 275.0 lb

## 2015-07-27 DIAGNOSIS — O99212 Obesity complicating pregnancy, second trimester: Secondary | ICD-10-CM

## 2015-07-27 DIAGNOSIS — Z3492 Encounter for supervision of normal pregnancy, unspecified, second trimester: Secondary | ICD-10-CM

## 2015-07-27 DIAGNOSIS — E669 Obesity, unspecified: Secondary | ICD-10-CM

## 2015-07-27 DIAGNOSIS — Z3482 Encounter for supervision of other normal pregnancy, second trimester: Secondary | ICD-10-CM

## 2015-07-27 NOTE — Progress Notes (Signed)
Subjective:  Nicole Dillon is a 20 y.o. SW [id number] (62 month old daughter)  at [redacted]w[redacted]d being seen today for ongoing prenatal care.  She is currently monitored for the following issues for this low-risk pregnancy and has Supervision of normal pregnancy; Rh negative state in antepartum period; Obesity in pregnancy, antepartum; Asthma, mild intermittent; and Other allergic rhinitis on her problem list.  Patient reports no complaints.  Contractions: Not present. Vag. Bleeding: None.  Movement: Absent. Denies leaking of fluid.   The following portions of the patient's history were reviewed and updated as appropriate: allergies, current medications, past family history, past medical history, past social history, past surgical history and problem list. Problem list updated.  Objective:   Filed Vitals:   07/27/15 1106  BP: 95/52  Pulse: 139  Weight: 275 lb (124.739 kg)    Fetal Status: Fetal Heart Rate (bpm): 147   Movement: Absent     General:  Alert, oriented and cooperative. Patient is in no acute distress.  Skin: Skin is warm and dry. No rash noted.   Cardiovascular: Normal heart rate noted  Respiratory: Normal respiratory effort, no problems with respiration noted  Abdomen: Soft, gravid, appropriate for gestational age. Pain/Pressure: Absent     Pelvic: Vag. Bleeding: None Vag D/C Character: Thin   Cervical exam deferred        Extremities: Normal range of motion.  Edema: None  Mental Status: Normal mood and affect. Normal behavior. Normal judgment and thought content.   Urinalysis: Urine Protein: Negative Urine Glucose: Negative  Assessment and Plan:  Pregnancy: G2P1001 at [redacted]w[redacted]d  1. Supervision of normal pregnancy, second trimester   2. Obesity in pregnancy, antepartum, second trimester   Preterm labor symptoms and general obstetric precautions including but not limited to vaginal bleeding, contractions, leaking of fluid and fetal movement were reviewed in detail with the  patient. Please refer to After Visit Summary for other counseling recommendations.  Return in about 4 weeks (around 08/24/2015).   Allie Bossier, MD

## 2015-07-30 ENCOUNTER — Ambulatory Visit (HOSPITAL_COMMUNITY)
Admission: RE | Admit: 2015-07-30 | Discharge: 2015-07-30 | Disposition: A | Discharge status: Home / Self Care | Payer: Medicaid Other | Source: Ambulatory Visit | Attending: Family Medicine | Admitting: Family Medicine

## 2015-07-30 ENCOUNTER — Other Ambulatory Visit (HOSPITAL_COMMUNITY): Payer: Self-pay | Admitting: Maternal and Fetal Medicine

## 2015-07-30 ENCOUNTER — Encounter (HOSPITAL_COMMUNITY): Payer: Self-pay

## 2015-07-30 VITALS — BP 134/64 | HR 131 | Wt 276.6 lb

## 2015-07-30 DIAGNOSIS — O99212 Obesity complicating pregnancy, second trimester: Secondary | ICD-10-CM | POA: Diagnosis not present

## 2015-07-30 DIAGNOSIS — Z3A21 21 weeks gestation of pregnancy: Secondary | ICD-10-CM | POA: Diagnosis not present

## 2015-07-30 DIAGNOSIS — O280 Abnormal hematological finding on antenatal screening of mother: Secondary | ICD-10-CM

## 2015-07-30 DIAGNOSIS — O360120 Maternal care for anti-D [Rh] antibodies, second trimester, not applicable or unspecified: Secondary | ICD-10-CM

## 2015-07-30 DIAGNOSIS — Z0489 Encounter for examination and observation for other specified reasons: Secondary | ICD-10-CM

## 2015-07-30 DIAGNOSIS — O360190 Maternal care for anti-D [Rh] antibodies, unspecified trimester, not applicable or unspecified: Secondary | ICD-10-CM | POA: Insufficient documentation

## 2015-07-30 DIAGNOSIS — IMO0002 Reserved for concepts with insufficient information to code with codable children: Secondary | ICD-10-CM

## 2015-07-30 DIAGNOSIS — O99330 Smoking (tobacco) complicating pregnancy, unspecified trimester: Secondary | ICD-10-CM

## 2015-07-30 DIAGNOSIS — O09892 Supervision of other high risk pregnancies, second trimester: Secondary | ICD-10-CM

## 2015-07-30 DIAGNOSIS — O99332 Smoking (tobacco) complicating pregnancy, second trimester: Secondary | ICD-10-CM | POA: Diagnosis present

## 2015-07-30 DIAGNOSIS — J45909 Unspecified asthma, uncomplicated: Secondary | ICD-10-CM

## 2015-07-30 DIAGNOSIS — Z36 Encounter for antenatal screening of mother: Secondary | ICD-10-CM | POA: Diagnosis present

## 2015-07-30 DIAGNOSIS — O9989 Other specified diseases and conditions complicating pregnancy, childbirth and the puerperium: Secondary | ICD-10-CM | POA: Diagnosis not present

## 2015-07-30 DIAGNOSIS — E669 Obesity, unspecified: Secondary | ICD-10-CM | POA: Insufficient documentation

## 2015-07-30 DIAGNOSIS — O99519 Diseases of the respiratory system complicating pregnancy, unspecified trimester: Secondary | ICD-10-CM

## 2015-08-24 ENCOUNTER — Ambulatory Visit (INDEPENDENT_AMBULATORY_CARE_PROVIDER_SITE_OTHER): Payer: Medicaid Other | Admitting: Obstetrics & Gynecology

## 2015-08-24 ENCOUNTER — Encounter: Payer: Self-pay | Admitting: Obstetrics & Gynecology

## 2015-08-24 VITALS — BP 116/74 | HR 139 | Wt 275.0 lb

## 2015-08-24 DIAGNOSIS — Z3492 Encounter for supervision of normal pregnancy, unspecified, second trimester: Secondary | ICD-10-CM

## 2015-08-24 DIAGNOSIS — O360121 Maternal care for anti-D [Rh] antibodies, second trimester, fetus 1: Secondary | ICD-10-CM | POA: Diagnosis not present

## 2015-08-24 DIAGNOSIS — Z3482 Encounter for supervision of other normal pregnancy, second trimester: Secondary | ICD-10-CM

## 2015-08-24 DIAGNOSIS — O99212 Obesity complicating pregnancy, second trimester: Secondary | ICD-10-CM

## 2015-08-24 DIAGNOSIS — J3089 Other allergic rhinitis: Secondary | ICD-10-CM | POA: Diagnosis not present

## 2015-08-24 DIAGNOSIS — J4520 Mild intermittent asthma, uncomplicated: Secondary | ICD-10-CM

## 2015-08-24 DIAGNOSIS — Z36 Encounter for antenatal screening for chromosomal anomalies: Secondary | ICD-10-CM

## 2015-08-24 DIAGNOSIS — E669 Obesity, unspecified: Secondary | ICD-10-CM

## 2015-08-24 NOTE — Progress Notes (Signed)
Subjective:  Nicole Dillon is a 20 y.o. G2P1001 at 6754w6d being seen today for ongoing prenatal care.  She is currently monitored for the following issues for this high-risk pregnancy and has Supervision of normal pregnancy; Rh negative state in antepartum period; Obesity in pregnancy, antepartum; Asthma, mild intermittent; and Other allergic rhinitis on her problem list.  Patient reports occ nose bleeds.  Contractions: Not present. Vag. Bleeding: None.  Movement: Present. Denies leaking of fluid.   The following portions of the patient's history were reviewed and updated as appropriate: allergies, current medications, past family history, past medical history, past social history, past surgical history and problem list. Problem list updated.  Objective:   Filed Vitals:   08/24/15 1008  BP: 116/74  Pulse: 139  Weight: 275 lb (124.739 kg)    Fetal Status: Fetal Heart Rate (bpm): 145 Fundal Height: 33 cm Movement: Present     General:  Alert, oriented and cooperative. Patient is in no acute distress.  Skin: Skin is warm and dry. No rash noted.   Cardiovascular: Normal heart rate noted  Respiratory: Normal respiratory effort, no problems with respiration noted  Abdomen: Soft, gravid, appropriate for gestational age. Pain/Pressure: Present     Pelvic: Vag. Bleeding: None Vag D/C Character: Mucous   Cervical exam deferred        Extremities: Normal range of motion.  Edema: Trace  Mental Status: Normal mood and affect. Normal behavior. Normal judgment and thought content.   Urinalysis:      Assessment and Plan:  Pregnancy: G2P1001 at 3354w6d  1. Screening for chromosomal anomalies by amniocentesis - US MFM OB FOLLOW UP; Future To be scheduled in 2 weeks to complete anatomy  2. Supervision of normal pregnancy, second trimester  3. Rh negative state in antepartum period, second trimester, fetus 1 Rhogam and 1 hour GTT at next visit   4. Other allergic rhinitis Vicks to keep nasal  passages moist.   5. Obesity in pregnancy, antepartum, second trimester Weight stable  6. Asthma, mild intermittent, uncomplicated   Preterm labor symptoms and general obstetric precautions including but not limited to vaginal bleeding, contractions, leaking of fluid and fetal movement were reviewed in detail with the patient. Please refer to After Visit Summary for other counseling recommendations.  Return in about 4 weeks (around 09/21/2015).   Willodean Rosenthalarolyn Harraway-Smith, MD

## 2015-08-24 NOTE — Progress Notes (Signed)
Needs refill on her prenatal vitamins.  Noted that every time she blows her nose or sneezes, she gets a nosebleed.

## 2015-08-24 NOTE — Patient Instructions (Signed)
Glucose Tolerance Test During Pregnancy The glucose tolerance test (GTT) is a blood test used to determine if you have developed a type of diabetes during pregnancy (gestational diabetes). This is when your body does not properly process sugar (glucose) in the food you eat, resulting in high blood glucose levels. Typically, a GTT is done after you have had a 1-hour glucose test with results that indicate you possibly have gestational diabetes. It may also be done if:  You have a history of giving birth to very large babies or have experienced repeated fetal loss (stillbirth).   You have signs and symptoms of diabetes, such as:   Changes in your vision.   Tingling or numbness in your hands or feet.   Changes in hunger, thirst, and urination not otherwise explained by your pregnancy.  The GTT lasts about 3 hours. You will be given a sugar-water solution to drink at the beginning of the test. You will have blood drawn before you drink the solution and then again 1, 2, and 3 hours after you drink it. You will not be allowed to eat or drink anything else during the test. You must remain at the testing location to make sure that your blood is drawn on time. You should also avoid exercising during the test, because exercise can alter test results. PREPARATION FOR TEST  Eat normally for 3 days prior to the GTT test, including having plenty of carbohydrate-rich foods. Do not eat or drink anything except water during the final 12 hours before the test. In addition, your health care provider may ask you to stop taking certain medicines before the test. RESULTS  It is your responsibility to obtain your test results. Ask the lab or department performing the test when and how you will get your results. Contact your health care provider to discuss any questions you have about your results.  Range of Normal Values Ranges for normal values may vary among different labs and hospitals. You should always check  with your health care provider after having lab work or other tests done to discuss whether your values are considered within normal limits. Normal levels of blood glucose are as follows:  Fasting: less than 105 mg/dL.   1 hour after drinking the solution: less than 190 mg/dL.   2 hours after drinking the solution: less than 165 mg/dL.   3 hours after drinking the solution: less than 145 mg/dL.  Some substances can interfere with GTT results. These may include:  Blood pressure and heart failure medicines, including beta blockers, furosemide, and thiazides.   Anti-inflammatory medicines, including aspirin.   Nicotine.   Some psychiatric medicines.  Meaning of Results Outside Normal Value Ranges GTT test results that are above normal values may indicate a number of health problems, such as:   Gestational diabetes.   Acute stress response.   Cushing syndrome.   Tumors such as pheochromocytoma or glucagonoma.   Long-term kidney problems.   Pancreatitis.   Hyperthyroidism.   Current infection.  Discuss your test results with your health care provider. He or she will use the results to make a diagnosis and determine a treatment plan that is right for you.   This information is not intended to replace advice given to you by your health care provider. Make sure you discuss any questions you have with your health care provider.   Document Released: 12/05/2011 Document Revised: 06/26/2014 Document Reviewed: 10/10/2013 [employer] Interactive Patient Education 2016 [employer]  

## 2015-09-10 ENCOUNTER — Ambulatory Visit (HOSPITAL_COMMUNITY)
Admission: RE | Admit: 2015-09-10 | Discharge: 2015-09-10 | Disposition: A | Discharge status: Home / Self Care | Payer: Medicaid Other | Source: Ambulatory Visit | Attending: Family Medicine | Admitting: Family Medicine

## 2015-09-10 ENCOUNTER — Other Ambulatory Visit (HOSPITAL_COMMUNITY): Payer: Self-pay | Admitting: Maternal and Fetal Medicine

## 2015-09-10 DIAGNOSIS — O99332 Smoking (tobacco) complicating pregnancy, second trimester: Secondary | ICD-10-CM | POA: Insufficient documentation

## 2015-09-10 DIAGNOSIS — O99212 Obesity complicating pregnancy, second trimester: Secondary | ICD-10-CM | POA: Diagnosis not present

## 2015-09-10 DIAGNOSIS — O99519 Diseases of the respiratory system complicating pregnancy, unspecified trimester: Secondary | ICD-10-CM

## 2015-09-10 DIAGNOSIS — O09892 Supervision of other high risk pregnancies, second trimester: Secondary | ICD-10-CM

## 2015-09-10 DIAGNOSIS — J45909 Unspecified asthma, uncomplicated: Secondary | ICD-10-CM

## 2015-09-10 DIAGNOSIS — O26892 Other specified pregnancy related conditions, second trimester: Secondary | ICD-10-CM | POA: Insufficient documentation

## 2015-09-10 DIAGNOSIS — Z6791 Unspecified blood type, Rh negative: Secondary | ICD-10-CM | POA: Diagnosis not present

## 2015-09-10 DIAGNOSIS — Z3A27 27 weeks gestation of pregnancy: Secondary | ICD-10-CM | POA: Diagnosis not present

## 2015-09-10 DIAGNOSIS — O360120 Maternal care for anti-D [Rh] antibodies, second trimester, not applicable or unspecified: Secondary | ICD-10-CM

## 2015-09-15 ENCOUNTER — Ambulatory Visit (HOSPITAL_COMMUNITY): Payer: Medicaid Other

## 2015-09-21 ENCOUNTER — Encounter: Payer: Medicaid Other | Admitting: Obstetrics and Gynecology

## 2015-09-29 ENCOUNTER — Encounter: Payer: Self-pay | Admitting: *Deleted

## 2015-09-29 ENCOUNTER — Telehealth: Payer: Self-pay | Admitting: *Deleted

## 2015-09-29 ENCOUNTER — Encounter: Payer: Medicaid Other | Admitting: Obstetrics & Gynecology

## 2015-09-29 NOTE — Telephone Encounter (Signed)
Completed 28wk PMH form

## 2015-10-18 ENCOUNTER — Encounter: Payer: Medicaid Other | Admitting: Obstetrics & Gynecology

## 2015-10-22 ENCOUNTER — Ambulatory Visit (INDEPENDENT_AMBULATORY_CARE_PROVIDER_SITE_OTHER): Payer: Medicaid Other | Admitting: Family Medicine

## 2015-10-22 VITALS — BP 122/75 | HR 144 | Wt 278.0 lb

## 2015-10-22 DIAGNOSIS — Z3493 Encounter for supervision of normal pregnancy, unspecified, third trimester: Secondary | ICD-10-CM

## 2015-10-22 DIAGNOSIS — Z3483 Encounter for supervision of other normal pregnancy, third trimester: Secondary | ICD-10-CM

## 2015-10-22 DIAGNOSIS — O360130 Maternal care for anti-D [Rh] antibodies, third trimester, not applicable or unspecified: Secondary | ICD-10-CM

## 2015-10-22 DIAGNOSIS — Z23 Encounter for immunization: Secondary | ICD-10-CM

## 2015-10-22 DIAGNOSIS — R000 Tachycardia, unspecified: Secondary | ICD-10-CM

## 2015-10-22 DIAGNOSIS — Z36 Encounter for antenatal screening of mother: Secondary | ICD-10-CM | POA: Diagnosis not present

## 2015-10-22 DIAGNOSIS — O3663X0 Maternal care for excessive fetal growth, third trimester, not applicable or unspecified: Secondary | ICD-10-CM

## 2015-10-22 LAB — CBC
HCT: 34.8 % — ABNORMAL LOW (ref 35.0–45.0)
HEMOGLOBIN: 11.4 g/dL — AB (ref 11.7–15.5)
MCH: 29.6 pg (ref 27.0–33.0)
MCHC: 32.8 g/dL (ref 32.0–36.0)
MCV: 90.4 fL (ref 80.0–100.0)
MPV: 9.7 fL (ref 7.5–12.5)
Platelets: 294 10*3/uL (ref 140–400)
RBC: 3.85 MIL/uL (ref 3.80–5.10)
RDW: 13.8 % (ref 11.0–15.0)
WBC: 10.4 10*3/uL (ref 3.8–10.8)

## 2015-10-22 MED ORDER — RHO D IMMUNE GLOBULIN 1500 UNIT/2ML IJ SOSY
300.0000 ug | PREFILLED_SYRINGE | Freq: Once | INTRAMUSCULAR | Status: AC
  Administered 2015-10-22: 300 ug via INTRAMUSCULAR

## 2015-10-22 NOTE — Patient Instructions (Signed)
Third Trimester of Pregnancy The third trimester is from week 29 through week 42, months 7 through 9. The third trimester is a time when the fetus is growing rapidly. At the end of the ninth month, the fetus is about 20 inches in length and weighs 6-10 pounds.  BODY CHANGES Your body goes through many changes during pregnancy. The changes vary from woman to woman.   Your weight will continue to increase. You can expect to gain 25-35 pounds (11-16 kg) by the end of the pregnancy.  You may begin to get stretch marks on your hips, abdomen, and breasts.  You may urinate more often because the fetus is moving lower into your pelvis and pressing on your bladder.  You may develop or continue to have heartburn as a result of your pregnancy.  You may develop constipation because certain hormones are causing the muscles that push waste through your intestines to slow down.  You may develop hemorrhoids or swollen, bulging veins (varicose veins).  You may have pelvic pain because of the weight gain and pregnancy hormones relaxing your joints between the bones in your pelvis. Backaches may result from overexertion of the muscles supporting your posture.  You may have changes in your hair. These can include thickening of your hair, rapid growth, and changes in texture. Some women also have hair loss during or after pregnancy, or hair that feels dry or thin. Your hair will most likely return to normal after your baby is born.  Your breasts will continue to grow and be tender. A yellow discharge may leak from your breasts called colostrum.  Your belly button may stick out.  You may feel short of breath because of your expanding uterus.  You may notice the fetus "dropping," or moving lower in your abdomen.  You may have a bloody mucus discharge. This usually occurs a few days to a week before labor begins.  Your cervix becomes thin and soft (effaced) near your due date. WHAT TO EXPECT AT YOUR  PRENATAL EXAMS  You will have prenatal exams every 2 weeks until week 36. Then, you will have weekly prenatal exams. During a routine prenatal visit:  You will be weighed to make sure you and the fetus are growing normally.  Your blood pressure is taken.  Your abdomen will be measured to track your baby's growth.  The fetal heartbeat will be listened to.  Any test results from the previous visit will be discussed.  You may have a cervical check near your due date to see if you have effaced. At around 36 weeks, your caregiver will check your cervix. At the same time, your caregiver will also perform a test on the secretions of the vaginal tissue. This test is to determine if a type of bacteria, Group B streptococcus, is present. Your caregiver will explain this further. Your caregiver may ask you:  What your birth plan is.  How you are feeling.  If you are feeling the baby move.  If you have had any abnormal symptoms, such as leaking fluid, bleeding, severe headaches, or abdominal cramping.  If you are using any tobacco products, including cigarettes, chewing tobacco, and electronic cigarettes.  If you have any questions. Other tests or screenings that may be performed during your third trimester include:  Blood tests that check for low iron levels (anemia).  Fetal testing to check the health, activity level, and growth of the fetus. Testing is done if you have certain medical conditions or if   there are problems during the pregnancy.  HIV (human immunodeficiency virus) testing. If you are at high risk, you may be screened for HIV during your third trimester of pregnancy. FALSE LABOR You may feel small, irregular contractions that eventually go away. These are called Braxton Hicks contractions, or false labor. Contractions may last for hours, days, or even weeks before true labor sets in. If contractions come at regular intervals, intensify, or become painful, it is best to be seen  by your caregiver.  SIGNS OF LABOR   Menstrual-like cramps.  Contractions that are 5 minutes apart or less.  Contractions that start on the top of the uterus and spread down to the lower abdomen and back.  A sense of increased pelvic pressure or back pain.  A watery or bloody mucus discharge that comes from the vagina. If you have any of these signs before the 37th week of pregnancy, call your caregiver right away. You need to go to the hospital to get checked immediately. HOME CARE INSTRUCTIONS   Avoid all smoking, herbs, alcohol, and unprescribed drugs. These chemicals affect the formation and growth of the baby.  Do not use any tobacco products, including cigarettes, chewing tobacco, and electronic cigarettes. If you need help quitting, ask your health care provider. You may receive counseling support and other resources to help you quit.  Follow your caregiver's instructions regarding medicine use. There are medicines that are either safe or unsafe to take during pregnancy.  Exercise only as directed by your caregiver. Experiencing uterine cramps is a good sign to stop exercising.  Continue to eat regular, healthy meals.  Wear a good support bra for breast tenderness.  Do not use hot tubs, steam rooms, or saunas.  Wear your seat belt at all times when driving.  Avoid raw meat, uncooked cheese, cat litter boxes, and soil used by cats. These carry germs that can cause birth defects in the baby.  Take your prenatal vitamins.  Take 1500-2000 mg of calcium daily starting at the 20th week of pregnancy until you deliver your baby.  Try taking a stool softener (if your caregiver approves) if you develop constipation. Eat more high-fiber foods, such as fresh vegetables or fruit and whole grains. Drink plenty of fluids to keep your urine clear or pale yellow.  Take warm sitz baths to soothe any pain or discomfort caused by hemorrhoids. Use hemorrhoid cream if your caregiver  approves.  If you develop varicose veins, wear support hose. Elevate your feet for 15 minutes, 3-4 times a day. Limit salt in your diet.  Avoid heavy lifting, wear low heal shoes, and practice good posture.  Rest a lot with your legs elevated if you have leg cramps or low back pain.  Visit your dentist if you have not gone during your pregnancy. Use a soft toothbrush to brush your teeth and be gentle when you floss.  A sexual relationship may be continued unless your caregiver directs you otherwise.  Do not travel far distances unless it is absolutely necessary and only with the approval of your caregiver.  Take prenatal classes to understand, practice, and ask questions about the labor and delivery.  Make a trial run to the hospital.  Pack your hospital bag.  Prepare the baby's nursery.  Continue to go to all your prenatal visits as directed by your caregiver. SEEK MEDICAL CARE IF:  You are unsure if you are in labor or if your water has broken.  You have dizziness.  You have   mild pelvic cramps, pelvic pressure, or nagging pain in your abdominal area.  You have persistent nausea, vomiting, or diarrhea.  You have a bad smelling vaginal discharge.  You have pain with urination. SEEK IMMEDIATE MEDICAL CARE IF:   You have a fever.  You are leaking fluid from your vagina.  You have spotting or bleeding from your vagina.  You have severe abdominal cramping or pain.  You have rapid weight loss or gain.  You have shortness of breath with chest pain.  You notice sudden or extreme swelling of your face, hands, ankles, feet, or legs.  You have not felt your baby move in over an hour.  You have severe headaches that do not go away with medicine.  You have vision changes.   This information is not intended to replace advice given to you by your health care provider. Make sure you discuss any questions you have with your health care provider.   Document Released:  05/30/2001 Document Revised: 06/26/2014 Document Reviewed: 08/06/2012 Elsevier Interactive Patient Education 2016 [employer]  Breastfeeding Deciding to breastfeed is one of the best choices you can make for you and your baby. A change in hormones during pregnancy causes your breast tissue to grow and increases the number and size of your milk ducts. These hormones also allow proteins, sugars, and fats from your blood supply to make breast milk in your milk-producing glands. Hormones prevent breast milk from being released before your baby is born as well as prompt milk flow after birth. Once breastfeeding has begun, thoughts of your baby, as well as his or her sucking or crying, can stimulate the release of milk from your milk-producing glands.  BENEFITS OF BREASTFEEDING For Your Baby  Your first milk (colostrum) helps your baby's digestive system function better.  There are antibodies in your milk that help your baby fight off infections.  Your baby has a lower incidence of asthma, allergies, and sudden infant death syndrome.  The nutrients in breast milk are better for your baby than infant formulas and are designed uniquely for your baby's needs.  Breast milk improves your baby's brain development.  Your baby is less likely to develop other conditions, such as childhood obesity, asthma, or type 2 diabetes mellitus. For You  Breastfeeding helps to create a very special bond between you and your baby.  Breastfeeding is convenient. Breast milk is always available at the correct temperature and costs nothing.  Breastfeeding helps to burn calories and helps you lose the weight gained during pregnancy.  Breastfeeding makes your uterus contract to its prepregnancy size faster and slows bleeding (lochia) after you give birth.   Breastfeeding helps to lower your risk of developing type 2 diabetes mellitus, osteoporosis, and breast or ovarian cancer later in life. SIGNS THAT YOUR BABY IS  HUNGRY Early Signs of Hunger  Increased alertness or activity.  Stretching.  Movement of the head from side to side.  Movement of the head and opening of the mouth when the corner of the mouth or cheek is stroked (rooting).  Increased sucking sounds, smacking lips, cooing, sighing, or squeaking.  Hand-to-mouth movements.  Increased sucking of fingers or hands. Late Signs of Hunger  Fussing.  Intermittent crying. Extreme Signs of Hunger Signs of extreme hunger will require calming and consoling before your baby will be able to breastfeed successfully. Do not wait for the following signs of extreme hunger to occur before you initiate breastfeeding:  Restlessness.  A loud, strong cry.  Screaming.   BREASTFEEDING BASICS Breastfeeding Initiation  Find a comfortable place to sit or lie down, with your neck and back well supported.  Place a pillow or rolled up blanket under your baby to bring him or her to the level of your breast (if you are seated). Nursing pillows are specially designed to help support your arms and your baby while you breastfeed.  Make sure that your baby's abdomen is facing your abdomen.  Gently massage your breast. With your fingertips, massage from your chest wall toward your nipple in a circular motion. This encourages milk flow. You may need to continue this action during the feeding if your milk flows slowly.  Support your breast with 4 fingers underneath and your thumb above your nipple. Make sure your fingers are well away from your nipple and your baby's mouth.  Stroke your baby's lips gently with your finger or nipple.  When your baby's mouth is open wide enough, quickly bring your baby to your breast, placing your entire nipple and as much of the colored area around your nipple (areola) as possible into your baby's mouth.  More areola should be visible above your baby's upper lip than below the lower lip.  Your baby's tongue should be between his  or her lower gum and your breast.  Ensure that your baby's mouth is correctly positioned around your nipple (latched). Your baby's lips should create a seal on your breast and be turned out (everted).  It is common for your baby to suck about 2-3 minutes in order to start the flow of breast milk. Latching Teaching your baby how to latch on to your breast properly is very important. An improper latch can cause nipple pain and decreased milk supply for you and poor weight gain in your baby. Also, if your baby is not latched onto your nipple properly, he or she may swallow some air during feeding. This can make your baby fussy. Burping your baby when you switch breasts during the feeding can help to get rid of the air. However, teaching your baby to latch on properly is still the best way to prevent fussiness from swallowing air while breastfeeding. Signs that your baby has successfully latched on to your nipple:  Silent tugging or silent sucking, without causing you pain.  Swallowing heard between every 3-4 sucks.  Muscle movement above and in front of his or her ears while sucking. Signs that your baby has not successfully latched on to nipple:  Sucking sounds or smacking sounds from your baby while breastfeeding.  Nipple pain. If you think your baby has not latched on correctly, slip your finger into the corner of your baby's mouth to break the suction and place it between your baby's gums. Attempt breastfeeding initiation again. Signs of Successful Breastfeeding Signs from your baby:  A gradual decrease in the number of sucks or complete cessation of sucking.  Falling asleep.  Relaxation of his or her body.  Retention of a small amount of milk in his or her mouth.  Letting go of your breast by himself or herself. Signs from you:  Breasts that have increased in firmness, weight, and size 1-3 hours after feeding.  Breasts that are softer immediately after  breastfeeding.  Increased milk volume, as well as a change in milk consistency and color by the fifth day of breastfeeding.  Nipples that are not sore, cracked, or bleeding. Signs That Your Baby is Getting Enough Milk  Wetting at least 3 diapers in a 24-hour period.   The urine should be clear and pale yellow by age 5 days.  At least 3 stools in a 24-hour period by age 5 days. The stool should be soft and yellow.  At least 3 stools in a 24-hour period by age 7 days. The stool should be seedy and yellow.  No loss of weight greater than 10% of birth weight during the first 3 days of age.  Average weight gain of 4-7 ounces (113-198 g) per week after age 4 days.  Consistent daily weight gain by age 5 days, without weight loss after the age of 2 weeks. After a feeding, your baby may spit up a small amount. This is common. BREASTFEEDING FREQUENCY AND DURATION Frequent feeding will help you make more milk and can prevent sore nipples and breast engorgement. Breastfeed when you feel the need to reduce the fullness of your breasts or when your baby shows signs of hunger. This is called "breastfeeding on demand." Avoid introducing a pacifier to your baby while you are working to establish breastfeeding (the first 4-6 weeks after your baby is born). After this time you may choose to use a pacifier. Research has shown that pacifier use during the first year of a baby's life decreases the risk of sudden infant death syndrome (SIDS). Allow your baby to feed on each breast as long as he or she wants. Breastfeed until your baby is finished feeding. When your baby unlatches or falls asleep while feeding from the first breast, offer the second breast. Because newborns are often sleepy in the first few weeks of life, you may need to awaken your baby to get him or her to feed. Breastfeeding times will vary from baby to baby. However, the following rules can serve as a guide to help you ensure that your baby is  properly fed:  Newborns (babies 4 weeks of age or younger) may breastfeed every 1-3 hours.  Newborns should not go longer than 3 hours during the day or 5 hours during the night without breastfeeding.  You should breastfeed your baby a minimum of 8 times in a 24-hour period until you begin to introduce solid foods to your baby at around 6 months of age. BREAST MILK PUMPING Pumping and storing breast milk allows you to ensure that your baby is exclusively fed your breast milk, even at times when you are unable to breastfeed. This is especially important if you are going back to work while you are still breastfeeding or when you are not able to be present during feedings. Your lactation consultant can give you guidelines on how long it is safe to store breast milk. A breast pump is a machine that allows you to pump milk from your breast into a sterile bottle. The pumped breast milk can then be stored in a refrigerator or freezer. Some breast pumps are operated by hand, while others use electricity. Ask your lactation consultant which type will work best for you. Breast pumps can be purchased, but some hospitals and breastfeeding support groups lease breast pumps on a monthly basis. A lactation consultant can teach you how to hand express breast milk, if you prefer not to use a pump. CARING FOR YOUR BREASTS WHILE YOU BREASTFEED Nipples can become dry, cracked, and sore while breastfeeding. The following recommendations can help keep your breasts moisturized and healthy:  Avoid using soap on your nipples.  Wear a supportive bra. Although not required, special nursing bras and tank tops are designed to allow access to your   breasts for breastfeeding without taking off your entire bra or top. Avoid wearing underwire-style bras or extremely tight bras.  Air dry your nipples for 3-4minutes after each feeding.  Use only cotton bra pads to absorb leaked breast milk. Leaking of breast milk between feedings  is normal.  Use lanolin on your nipples after breastfeeding. Lanolin helps to maintain your skin's normal moisture barrier. If you use pure lanolin, you do not need to wash it off before feeding your baby again. Pure lanolin is not toxic to your baby. You may also hand express a few drops of breast milk and gently massage that milk into your nipples and allow the milk to air dry. In the first few weeks after giving birth, some women experience extremely full breasts (engorgement). Engorgement can make your breasts feel heavy, warm, and tender to the touch. Engorgement peaks within 3-5 days after you give birth. The following recommendations can help ease engorgement:  Completely empty your breasts while breastfeeding or pumping. You may want to start by applying warm, moist heat (in the shower or with warm water-soaked hand towels) just before feeding or pumping. This increases circulation and helps the milk flow. If your baby does not completely empty your breasts while breastfeeding, pump any extra milk after he or she is finished.  Wear a snug bra (nursing or regular) or tank top for 1-2 days to signal your body to slightly decrease milk production.  Apply ice packs to your breasts, unless this is too uncomfortable for you.  Make sure that your baby is latched on and positioned properly while breastfeeding. If engorgement persists after 48 hours of following these recommendations, contact your health care provider or a lactation consultant. OVERALL HEALTH CARE RECOMMENDATIONS WHILE BREASTFEEDING  Eat healthy foods. Alternate between meals and snacks, eating 3 of each per day. Because what you eat affects your breast milk, some of the foods may make your baby more irritable than usual. Avoid eating these foods if you are sure that they are negatively affecting your baby.  Drink milk, fruit juice, and water to satisfy your thirst (about 10 glasses a day).  Rest often, relax, and continue to take  your prenatal vitamins to prevent fatigue, stress, and anemia.  Continue breast self-awareness checks.  Avoid chewing and smoking tobacco. Chemicals from cigarettes that pass into breast milk and exposure to secondhand smoke may harm your baby.  Avoid alcohol and drug use, including marijuana. Some medicines that may be harmful to your baby can pass through breast milk. It is important to ask your health care provider before taking any medicine, including all over-the-counter and prescription medicine as well as vitamin and herbal supplements. It is possible to become pregnant while breastfeeding. If birth control is desired, ask your health care provider about options that will be safe for your baby. SEEK MEDICAL CARE IF:  You feel like you want to stop breastfeeding or have become frustrated with breastfeeding.  You have painful breasts or nipples.  Your nipples are cracked or bleeding.  Your breasts are red, tender, or warm.  You have a swollen area on either breast.  You have a fever or chills.  You have nausea or vomiting.  You have drainage other than breast milk from your nipples.  Your breasts do not become full before feedings by the fifth day after you give birth.  You feel sad and depressed.  Your baby is too sleepy to eat well.  Your baby is having trouble sleeping.     Your baby is wetting less than 3 diapers in a 24-hour period.  Your baby has less than 3 stools in a 24-hour period.  Your baby's skin or the white part of his or her eyes becomes yellow.   Your baby is not gaining weight by 5 days of age. SEEK IMMEDIATE MEDICAL CARE IF:  Your baby is overly tired (lethargic) and does not want to wake up and feed.  Your baby develops an unexplained fever.   This information is not intended to replace advice given to you by your health care provider. Make sure you discuss any questions you have with your health care provider.   Document Released: 06/05/2005  Document Revised: 02/24/2015 Document Reviewed: 11/27/2012 Elsevier Interactive Patient Education 2016 [employer]  

## 2015-10-22 NOTE — Progress Notes (Signed)
Subjective:  Nicole Dillon is a 20 y.o. G2P1001 at 4879w2d being seen today for ongoing prenatal care.  She is currently monitored for the following issues for this low-risk pregnancy and has Supervision of normal pregnancy; Rh negative state in antepartum period; Obesity in pregnancy, antepartum; Asthma, mild intermittent; and Other allergic rhinitis on her problem list.  Patient reports no complaints.  Contractions: Irregular. Vag. Bleeding: None.  Movement: Present. Denies leaking of fluid.   The following portions of the patient's history were reviewed and updated as appropriate: allergies, current medications, past family history, past medical history, past social history, past surgical history and problem list. Problem list updated.  Objective:   Filed Vitals:   10/22/15 1035  BP: 122/75  Pulse: 144  Weight: 278 lb (126.1 kg)    Fetal Status: Fetal Heart Rate (bpm): 138 Fundal Height: 37 cm Movement: Present     General:  Alert, oriented and cooperative. Patient is in no acute distress.  Skin: Skin is warm and dry. No rash noted.   Cardiovascular: Normal heart rate noted  Respiratory: Normal respiratory effort, no problems with respiration noted  Abdomen: Soft, gravid, appropriate for gestational age. Pain/Pressure: Absent     Pelvic: Vag. Bleeding: None Vag D/C Character: Thin   Cervical exam deferred        Extremities: Normal range of motion.  Edema: Trace  Mental Status: Normal mood and affect. Normal behavior. Normal judgment and thought content.    Assessment and Plan:  Pregnancy: G2P1001 at 8479w2d  1. Supervision of normal pregnancy in third trimester Has not been in since 24 wks, due to transportation issues--mother is getting ready to undergo amputation of her hand - CBC - HIV antibody - RPR - Glucose Tolerance, 1 HR (50g) - Tdap vaccine greater than or equal to 7yo IM - Antibody screen  2. Rh negative state in antepartum period, third trimester, not applicable  or unspecified fetus  - rho (d) immune globulin (RHIG/RHOPHYLAC) injection 300 mcg; Inject 2 mLs (300 mcg total) into the muscle once.  4. Tachycardia If TSH is normal, consider cards referral - TSH  5. Large for dates affecting management of mother, third trimester, not applicable or unspecified fetus Check U/s for growth - US MFM OB FOLLOW UP; Future  Preterm labor symptoms and general obstetric precautions including but not limited to vaginal bleeding, contractions, leaking of fluid and fetal movement were reviewed in detail with the patient. Please refer to After Visit Summary for other counseling recommendations.  Return in 2 weeks (on 11/05/2015).   Reva Boresanya S Masen Salvas, MD

## 2015-10-23 LAB — TSH: TSH: 1.04 m[IU]/L

## 2015-10-23 LAB — HIV ANTIBODY (ROUTINE TESTING W REFLEX): HIV: NONREACTIVE

## 2015-10-23 LAB — ANTIBODY SCREEN: ANTIBODY SCREEN: NEGATIVE

## 2015-10-25 LAB — GLUCOSE TOLERANCE, 1 HOUR (50G) W/O FASTING: GLUCOSE, 1 HR, GESTATIONAL: 104 mg/dL (ref <140)

## 2015-10-25 LAB — RPR

## 2015-11-01 ENCOUNTER — Encounter: Payer: Medicaid Other | Admitting: Obstetrics & Gynecology

## 2015-11-04 ENCOUNTER — Other Ambulatory Visit: Payer: Self-pay | Admitting: Family Medicine

## 2015-11-04 ENCOUNTER — Encounter (HOSPITAL_COMMUNITY): Payer: Self-pay

## 2015-11-04 ENCOUNTER — Ambulatory Visit (HOSPITAL_COMMUNITY)
Admission: RE | Admit: 2015-11-04 | Discharge: 2015-11-04 | Disposition: A | Discharge status: Home / Self Care | Payer: Medicaid Other | Source: Ambulatory Visit | Attending: Family Medicine | Admitting: Family Medicine

## 2015-11-04 VITALS — BP 103/66 | HR 136 | Wt 278.0 lb

## 2015-11-04 DIAGNOSIS — O26893 Other specified pregnancy related conditions, third trimester: Secondary | ICD-10-CM | POA: Diagnosis present

## 2015-11-04 DIAGNOSIS — O360130 Maternal care for anti-D [Rh] antibodies, third trimester, not applicable or unspecified: Secondary | ICD-10-CM

## 2015-11-04 DIAGNOSIS — Z3493 Encounter for supervision of normal pregnancy, unspecified, third trimester: Secondary | ICD-10-CM

## 2015-11-04 DIAGNOSIS — J45909 Unspecified asthma, uncomplicated: Secondary | ICD-10-CM | POA: Insufficient documentation

## 2015-11-04 DIAGNOSIS — Z3A35 35 weeks gestation of pregnancy: Secondary | ICD-10-CM

## 2015-11-04 DIAGNOSIS — O99333 Smoking (tobacco) complicating pregnancy, third trimester: Secondary | ICD-10-CM | POA: Insufficient documentation

## 2015-11-04 DIAGNOSIS — O99513 Diseases of the respiratory system complicating pregnancy, third trimester: Secondary | ICD-10-CM | POA: Insufficient documentation

## 2015-11-04 DIAGNOSIS — O09893 Supervision of other high risk pregnancies, third trimester: Secondary | ICD-10-CM | POA: Diagnosis not present

## 2015-11-04 DIAGNOSIS — O3663X0 Maternal care for excessive fetal growth, third trimester, not applicable or unspecified: Secondary | ICD-10-CM

## 2015-11-04 DIAGNOSIS — O26843 Uterine size-date discrepancy, third trimester: Secondary | ICD-10-CM

## 2015-11-04 DIAGNOSIS — O99330 Smoking (tobacco) complicating pregnancy, unspecified trimester: Secondary | ICD-10-CM

## 2015-11-04 DIAGNOSIS — O99213 Obesity complicating pregnancy, third trimester: Secondary | ICD-10-CM

## 2015-11-04 DIAGNOSIS — Z6791 Unspecified blood type, Rh negative: Secondary | ICD-10-CM | POA: Diagnosis not present

## 2015-11-11 ENCOUNTER — Other Ambulatory Visit (HOSPITAL_COMMUNITY)
Admission: RE | Admit: 2015-11-11 | Discharge: 2015-11-11 | Disposition: A | Discharge status: Home / Self Care | Payer: Medicaid Other | Source: Ambulatory Visit | Attending: Certified Nurse Midwife | Admitting: Certified Nurse Midwife

## 2015-11-11 ENCOUNTER — Ambulatory Visit (INDEPENDENT_AMBULATORY_CARE_PROVIDER_SITE_OTHER): Payer: Medicaid Other | Admitting: Certified Nurse Midwife

## 2015-11-11 VITALS — BP 112/78 | HR 134 | Wt 277.0 lb

## 2015-11-11 DIAGNOSIS — Z3483 Encounter for supervision of other normal pregnancy, third trimester: Secondary | ICD-10-CM

## 2015-11-11 DIAGNOSIS — Z113 Encounter for screening for infections with a predominantly sexual mode of transmission: Secondary | ICD-10-CM

## 2015-11-11 DIAGNOSIS — Z36 Encounter for antenatal screening of mother: Secondary | ICD-10-CM

## 2015-11-11 DIAGNOSIS — Z3493 Encounter for supervision of normal pregnancy, unspecified, third trimester: Secondary | ICD-10-CM

## 2015-11-11 LAB — OB RESULTS CONSOLE GBS: GBS: POSITIVE

## 2015-11-11 NOTE — Patient Instructions (Signed)
Group B streptococcus (GBS) is a type of bacteria often found in healthy women. GBS is not the same as the bacteria that causes strep throat. You may have GBS in your vagina, rectum, or bladder. GBS does not spread through sexual contact, but it can be passed to a baby during childbirth. This can be dangerous for your baby. It is not dangerous to you and usually does not cause any symptoms. Your health care provider may test you for GBS when your pregnancy is between 35 and 37 weeks. GBS is dangerous only during birth, so there is no need to test for it earlier. It is possible to have GBS during pregnancy and never pass it to your baby. If your test results are positive for GBS, your health care provider may recommend giving you antibiotic medicine during delivery to make sure your baby stays healthy. RISK FACTORS You are more likely to pass GBS to your baby if:   Your water breaks (ruptured membrane) or you go into labor before 37 weeks.  Your water breaks 18 hours before you deliver.  You passed GBS during a previous pregnancy.  You have a urinary tract infection caused by GBS any time during pregnancy.  You have a fever during labor. SYMPTOMS Most women who have GBS do not have any symptoms. If you have a urinary tract infection caused by GBS, you might have frequent or painful urination and fever. Babies who get GBS usually show symptoms within 7 days of birth. Symptoms may include:   Breathing problems.  Heart and blood pressure problems.  Digestive and kidney problems. DIAGNOSIS Routine screening for GBS is recommended for all pregnant women. A health care provider takes a sample of the fluid in your vagina and rectum with a swab. It is then sent to a lab to be checked for GBS. A sample of your urine may also be checked for the bacteria.  TREATMENT If you test positive for GBS, you may need treatment with an antibiotic medicine during labor. As soon as you go into labor, or as soon as  your membranes rupture, you will get the antibiotic medicine through an IV access. You will continue to get the medicine until after you give birth. You do not need antibiotic medicine if you are having a cesarean delivery.If your baby shows signs or symptoms of GBS after birth, your baby can also be treated with an antibiotic medicine. HOME CARE INSTRUCTIONS   Take all antibiotic medicine as prescribed by your health care provider. Only take medicine as directed.   Continue with prenatal visits and care.   Keep all follow-up appointments.  SEEK MEDICAL CARE IF:   You have pain when you urinate.   You have to urinate frequently.   You have a fever.  SEEK IMMEDIATE MEDICAL CARE IF:   Your membranes rupture.  You go into labor.   This information is not intended to replace advice given to you by your health care provider. Make sure you discuss any questions you have with your health care provider.   Document Released: 09/12/2007 Document Revised: 06/10/2013 Document Reviewed: 03/28/2013 Elsevier Interactive Patient Education 2016 Elsevier Inc.  

## 2015-11-11 NOTE — Progress Notes (Signed)
  Subjective:  Nicole Dillon is a 20 y.o. G2P1001 at 6664w1d being seen today for ongoing prenatal care.  She is currently monitored for the following issues for this low-risk pregnancy and has Supervision of normal pregnancy; Rh negative state in antepartum period; Obesity in pregnancy, antepartum; Asthma, mild intermittent; and Other allergic rhinitis on her problem list.  Patient reports no complaints.  Contractions: Irregular. Vag. Bleeding: None.  Movement: Present. Denies leaking of fluid.   The following portions of the patient's history were reviewed and updated as appropriate: allergies, current medications, past family history, past medical history, past social history, past surgical history and problem list. Problem list updated.  Objective:   Filed Vitals:   11/11/15 1501  BP: 112/78  Pulse: 134  Weight: 277 lb (125.646 kg)    Fetal Status: Fetal Heart Rate (bpm): 141   Movement: Present     General:  Alert, oriented and cooperative. Patient is in no acute distress.  Skin: Skin is warm and dry. No rash noted.   Cardiovascular: Normal heart rate noted  Respiratory: Normal respiratory effort, no problems with respiration noted  Abdomen: Soft, gravid, appropriate for gestational age. Pain/Pressure: Present     Pelvic: Vag. Bleeding: None Vag D/C Character: Thin   Cervical exam performed        Extremities: Normal range of motion.  Edema: None  Mental Status: Normal mood and affect. Normal behavior. Normal judgment and thought content.   Urinalysis: Urine Protein: Trace Urine Glucose: Negative  Assessment and Plan:  Pregnancy: G2P1001 at 1264w1d  1. Supervision of normal pregnancy in third trimester  - GC[facility]Chlamydia [facility] amp [facility] - Culture, beta strep (group b only)  2. Supervision of normal pregnancy, third trimester   Preterm labor symptoms and general obstetric precautions including but not limited to vaginal bleeding, contractions, leaking of  fluid and fetal movement were reviewed in detail with the patient. Please refer to After Visit Summary for other counseling recommendations.  Return in about 1 week (around 11/18/2015).   Rhea PinkLori A Clemmons, CNM

## 2015-11-12 LAB — CULTURE, BETA STREP (GROUP B ONLY)

## 2015-11-16 LAB — GC/CHLAMYDIA PROBE AMP (~~LOC~~) NOT AT ARMC
Chlamydia: POSITIVE — AB
Neisseria Gonorrhea: NEGATIVE

## 2015-11-18 ENCOUNTER — Telehealth: Payer: Self-pay | Admitting: *Deleted

## 2015-11-18 ENCOUNTER — Ambulatory Visit (INDEPENDENT_AMBULATORY_CARE_PROVIDER_SITE_OTHER): Payer: Medicaid Other | Admitting: Advanced Practice Midwife

## 2015-11-18 VITALS — BP 111/73 | HR 163 | Wt 276.0 lb

## 2015-11-18 DIAGNOSIS — O98819 Other maternal infectious and parasitic diseases complicating pregnancy, unspecified trimester: Principal | ICD-10-CM

## 2015-11-18 DIAGNOSIS — O98319 Other infections with a predominantly sexual mode of transmission complicating pregnancy, unspecified trimester: Secondary | ICD-10-CM

## 2015-11-18 DIAGNOSIS — O360130 Maternal care for anti-D [Rh] antibodies, third trimester, not applicable or unspecified: Secondary | ICD-10-CM

## 2015-11-18 DIAGNOSIS — Z8619 Personal history of other infectious and parasitic diseases: Secondary | ICD-10-CM | POA: Insufficient documentation

## 2015-11-18 DIAGNOSIS — O99820 Streptococcus B carrier state complicating pregnancy: Secondary | ICD-10-CM | POA: Insufficient documentation

## 2015-11-18 DIAGNOSIS — Z3493 Encounter for supervision of normal pregnancy, unspecified, third trimester: Secondary | ICD-10-CM

## 2015-11-18 DIAGNOSIS — A749 Chlamydial infection, unspecified: Secondary | ICD-10-CM

## 2015-11-18 DIAGNOSIS — Z3483 Encounter for supervision of other normal pregnancy, third trimester: Secondary | ICD-10-CM

## 2015-11-18 HISTORY — DX: Personal history of other infectious and parasitic diseases: Z86.19

## 2015-11-18 MED ORDER — AZITHROMYCIN 500 MG PO TABS: 1000.0000 mg | ORAL_TABLET | Freq: Once | ORAL | Status: DC

## 2015-11-18 NOTE — Addendum Note (Signed)
Addended by: Gita KudoLASSITER, KRISTEN S on: 11/18/2015 03:46 PM   Modules accepted: Orders

## 2015-11-18 NOTE — Telephone Encounter (Signed)
Entered in error

## 2015-11-18 NOTE — Patient Instructions (Addendum)
Group B streptococcus (GBS) is a type of bacteria often found in healthy women. GBS is not the same as the bacteria that causes strep throat. You may have GBS in your vagina, rectum, or bladder. GBS does not spread through sexual contact, but it can be passed to a baby during childbirth. This can be dangerous for your baby. It is not dangerous to you and usually does not cause any symptoms. Your health care provider may test you for GBS when your pregnancy is between 35 and 37 weeks. GBS is dangerous only during birth, so there is no need to test for it earlier. It is possible to have GBS during pregnancy and never pass it to your baby. If your test results are positive for GBS, your health care provider may recommend giving you antibiotic medicine during delivery to make sure your baby stays healthy. RISK FACTORS You are more likely to pass GBS to your baby if:   Your water breaks (ruptured membrane) or you go into labor before 37 weeks.  Your water breaks 18 hours before you deliver.  You passed GBS during a previous pregnancy.  You have a urinary tract infection caused by GBS any time during pregnancy.  You have a fever during labor. SYMPTOMS Most women who have GBS do not have any symptoms. If you have a urinary tract infection caused by GBS, you might have frequent or painful urination and fever. Babies who get GBS usually show symptoms within 7 days of birth. Symptoms may include:   Breathing problems.  Heart and blood pressure problems.  Digestive and kidney problems. DIAGNOSIS Routine screening for GBS is recommended for all pregnant women. A health care provider takes a sample of the fluid in your vagina and rectum with a swab. It is then sent to a lab to be checked for GBS. A sample of your urine may also be checked for the bacteria.  TREATMENT If you test positive for GBS, you may need treatment with an antibiotic medicine during labor. As soon as you go into labor, or as soon as  your membranes rupture, you will get the antibiotic medicine through an IV access. You will continue to get the medicine until after you give birth. You do not need antibiotic medicine if you are having a cesarean delivery.If your baby shows signs or symptoms of GBS after birth, your baby can also be treated with an antibiotic medicine. HOME CARE INSTRUCTIONS   Take all antibiotic medicine as prescribed by your health care provider. Only take medicine as directed.   Continue with prenatal visits and care.   Keep all follow-up appointments.  SEEK MEDICAL CARE IF:   You have pain when you urinate.   You have to urinate frequently.   You have a fever.  SEEK IMMEDIATE MEDICAL CARE IF:   Your membranes rupture.  You go into labor.   This information is not intended to replace advice given to you by your health care provider. Make sure you discuss any questions you have with your health care provider.   Document Released: 09/12/2007 Document Revised: 06/10/2013 Document Reviewed: 03/28/2013 Elsevier Interactive Patient Education 2016 [employer].  Pregnancy and Sexually Transmitted Diseases A sexually transmitted disease (STD) is a disease or infection that may be passed (transmitted) from person to person, usually during sexual activity. This may happen by way of saliva, semen, blood, vaginal mucus, or urine. An STD can be caused by bacteria, viruses, or parasites.  During pregnancy, STDs can be  dangerous for both you and your unborn baby. It is important to take steps to reduce your chances of getting an STD. Also, you need to be looked at by your health care provider right away if you think you may have an STD or may have been exposed to an STD. Diagnosis and treatment will depend on the type of STD. If you are already pregnant, you will be screened for HIV (human immunodeficiency virus) early in your pregnancy. If you are at high risk for HIV, this test may be repeated during your  third trimester of pregnancy. WHAT ARE SOME COMMON STDs? There are different types of STDs. Some STDs that cause problems in pregnancy include:  Gonorrhea.   Chlamydia.   Syphilis.   HIV and AIDS.   Genital herpes.   Hepatitis.   Genital warts.   Human papillomavirus (HPV). STDs that do not affect the baby include:   Trichomonas.   Pubic lice.  WHAT ARE THE POSSIBLE EFFECTS OF STDs DURING PREGNANCY? STDs can have various effects during pregnancy. STDs can cause:   Stillbirth.   Miscarriage.   Premature labor.   Premature rupture of the membranes.   Serious birth defects or deformities.   Infection of the amniotic sac.   Infections that occur after birth (postpartum) in you and the baby.   Slowed growth of the baby before birth.   Illnesses in newborns.  WHAT ARE COMMON SYMPTOMS OF STDs? Different STDs have different symptoms. Some women may not have any symptoms. If symptoms are present, they may include:  Painful or bloody urination.   Pain in the pelvis, abdomen, vagina, anus, throat, or eyes.  A skin rash, itching, or irritation.   Growths, ulcerations, blisters, or sores in the genital and anal areas.   Fever.   Abnormal vaginal discharge with or without bad odor.   Pain or bleeding during sexual intercourse.   Yellow skin and eyes (jaundice). This is seen with hepatitis.   Swollen glands in the groin area.  Even if symptoms are not present, an STD can still be passed to another person during sexual contact.  HOW ARE STDs DIAGNOSED? Your health care provider can determine if you have an STD through different tests. These can include blood tests, urine tests, and tests performed during a pelvic exam. You should be screened for sexually transmitted illnesses (STIs), including gonorrhea and chlamydia if:   You are sexually active and are younger than 21 years old.  You are older than 21 years old and your health care  provider tells you that you are at risk for this type of infection.  Your sexual activity has changed since you were last screened, and you are at an increased risk for chlamydia or gonorrhea. Ask your health care provider if you are at risk. HOW CAN I REDUCE MY RISK OF GETTING AN STD?  Take these steps to reduce your risk of getting an STD:  Use a latex condom or female condom during sexual intercourse.   Use dental dams and water-soluble lubricants during sexual activity. Do not use petroleum jelly or oils.  Avoid having multiple sex partners.  Do not have sex with someone who has other sex partners.  Do not have sex with anyone you do not know or who is at high risk for an STD.   Avoid risky sex acts that can break the skin.  Do not have sex if you have open sores on your mouth or skin.  Avoid engaging  in oral and anal sex acts.   Get the hepatitis vaccine. It is safe for pregnant women.  WHAT SHOULD I DO IF I THINK I HAVE AN STD?  See your health care provider.  Tell your sexual partner(s). They should be tested and treated for any STDs.  Do not have sex until your health care provider says it is okay. WHEN SHOULD I GET IMMEDIATE MEDICAL CARE? Contact your health care provider right away if:   You have any symptoms of an STD.  You think you or your sex partner has an STD, even if there are no symptoms.  You think you may have been exposed to an STD.   This information is not intended to replace advice given to you by your health care provider. Make sure you discuss any questions you have with your health care provider.   Document Released: 07/13/2004 Document Revised: 06/26/2014 Document Reviewed: 01/02/2013 Elsevier Interactive Patient Education [employer]

## 2015-11-18 NOTE — Progress Notes (Signed)
Subjective:  Lenard Forthamika Rinehart is a 20 y.o. G2P1001 at 2624w1d being seen today for ongoing prenatal care.  She is currently monitored for the following issues for this low-risk pregnancy and has Supervision of normal pregnancy; Rh negative state in antepartum period; Obesity in pregnancy, antepartum; Asthma, mild intermittent; and Other allergic rhinitis on her problem list.  Patient reports no complaints.  Contractions: Not present. Vag. Bleeding: None.  Movement: Present. Denies leaking of fluid.   The following portions of the patient's history were reviewed and updated as appropriate: allergies, current medications, past family history, past medical history, past social history, past surgical history and problem list. Problem list updated.  Objective:   Filed Vitals:   11/18/15 1502  BP: 111/73  Pulse: 163  Weight: 276 lb (125.193 kg)    Fetal Status: Fetal Heart Rate (bpm): 157 Fundal Height: 42 cm Movement: Present     General:  Alert, oriented and cooperative. Patient is in no acute distress.  Skin: Skin is warm and dry. No rash noted.   Cardiovascular: Normal heart rate noted  Respiratory: Normal respiratory effort, no problems with respiration noted  Abdomen: Soft, gravid, appropriate for gestational age. Pain/Pressure: Present     Pelvic: Vag. Bleeding: None Vag D/C Character: Thin   Cervical exam deferred        Extremities: Normal range of motion.  Edema: None  Mental Status: Normal mood and affect. Normal behavior. Normal judgment and thought content.   Urinalysis: Urine Protein: Trace Urine Glucose: Negative  Assessment and Plan:  Pregnancy: G2P1001 at 4624w1d  1. Chlamydia infection affecting pregnancy  - azithromycin (ZITHROMAX) 500 MG tablet; Take 2 tablets (1,000 mg total) by mouth once.  Dispense: 2 tablet; Refill: 0 - Expedited partner therapy given and info sheet.  - No IC x 1 week, then always use condoms  2. Supervision of normal pregnancy, third  trimester   3. Rh negative state in antepartum period, third trimester, not applicable or unspecified fetus  4. GBS pos  -PCN labor   Term labor symptoms and general obstetric precautions including but not limited to vaginal bleeding, contractions, leaking of fluid and fetal movement were reviewed in detail with the patient. Please refer to After Visit Summary for other counseling recommendations.  Return in about 1 week (around 11/25/2015).   Dorathy KinsmanVirginia Kinzley Savell, CNM

## 2015-11-25 ENCOUNTER — Ambulatory Visit (INDEPENDENT_AMBULATORY_CARE_PROVIDER_SITE_OTHER): Payer: Medicaid Other | Admitting: Obstetrics & Gynecology

## 2015-11-25 VITALS — BP 109/70 | HR 153 | Wt 275.0 lb

## 2015-11-25 DIAGNOSIS — O98319 Other infections with a predominantly sexual mode of transmission complicating pregnancy, unspecified trimester: Secondary | ICD-10-CM

## 2015-11-25 DIAGNOSIS — O98819 Other maternal infectious and parasitic diseases complicating pregnancy, unspecified trimester: Secondary | ICD-10-CM

## 2015-11-25 DIAGNOSIS — O99820 Streptococcus B carrier state complicating pregnancy: Secondary | ICD-10-CM

## 2015-11-25 DIAGNOSIS — O99212 Obesity complicating pregnancy, second trimester: Secondary | ICD-10-CM

## 2015-11-25 DIAGNOSIS — A749 Chlamydial infection, unspecified: Secondary | ICD-10-CM

## 2015-11-25 DIAGNOSIS — E669 Obesity, unspecified: Secondary | ICD-10-CM

## 2015-11-25 DIAGNOSIS — O360130 Maternal care for anti-D [Rh] antibodies, third trimester, not applicable or unspecified: Secondary | ICD-10-CM

## 2015-11-25 DIAGNOSIS — Z3493 Encounter for supervision of normal pregnancy, unspecified, third trimester: Secondary | ICD-10-CM

## 2015-11-25 MED ORDER — AZITHROMYCIN 500 MG PO TABS: 1000.0000 mg | ORAL_TABLET | Freq: Once | ORAL | Status: DC

## 2015-11-25 NOTE — Addendum Note (Signed)
Addended by: Gita KudoLASSITER, Jaquane Boughner S on: 11/25/2015 10:40 AM   Modules accepted: Orders

## 2015-11-25 NOTE — Progress Notes (Signed)
Subjective:  Nicole Dillon is a 20 y.o. S G2P1001 at 51109w1d being seen today for ongoing prenatal care.  She is currently monitored for the following issues for this low-risk pregnancy and has Supervision of normal pregnancy; Rh negative state in antepartum period; Obesity in pregnancy, antepartum; Asthma, mild intermittent; Other allergic rhinitis; Chlamydia infection affecting pregnancy; and Group B Streptococcus carrier, antepartum on her problem list.  Patient reports no complaints.  Contractions: Irregular. Vag. Bleeding: None.  Movement: Present. Denies leaking of fluid.   The following portions of the patient's history were reviewed and updated as appropriate: allergies, current medications, past family history, past medical history, past social history, past surgical history and problem list. Problem list updated.  Objective:   Filed Vitals:   11/25/15 1028  BP: 109/70  Pulse: 153  Weight: 275 lb (124.739 kg)    Fetal Status: Fetal Heart Rate (bpm): 140 Fundal Height: 41 cm Movement: Present  Presentation: Vertex  General:  Alert, oriented and cooperative. Patient is in no acute distress.  Skin: Skin is warm and dry. No rash noted.   Cardiovascular: Normal heart rate noted  Respiratory: Normal respiratory effort, no problems with respiration noted  Abdomen: Soft, gravid, appropriate for gestational age. Pain/Pressure: Present     Pelvic: Vag. Bleeding: None Vag D/C Character: Thin   Cervical exam performed        Extremities: Normal range of motion.  Edema: None  Mental Status: Normal mood and affect. Normal behavior. Normal judgment and thought content.   Urinalysis:      Assessment and Plan:  Pregnancy: G2P1001 at 69109w1d  1. Group B Streptococcus carrier, antepartum   2. Obesity in pregnancy, antepartum, second trimester - Her FH is size greater than dates but an u/s 3 weeks ago was normal. I will schedule another u/s for growth  3. Supervision of normal pregnancy,  third trimester - Her boyfriend did not get treated, planning to go to health dept at his convenience. I have rec'd no sex until after delivery  4. Rh negative state in antepartum period, third trimester, not applicable or unspecified fetus   5. Chlamydia infection affecting pregnancy  - azithromycin (ZITHROMAX) 500 MG tablet; Take 2 tablets (1,000 mg total) by mouth once.  Dispense: 2 tablet; Refill: 0  Term labor symptoms and general obstetric precautions including but not limited to vaginal bleeding, contractions, leaking of fluid and fetal movement were reviewed in detail with the patient. Please refer to After Visit Summary for other counseling recommendations.  Return in about 1 week (around 12/02/2015).   Allie BossierMyra C Ladye Macnaughton, MD

## 2015-12-01 ENCOUNTER — Ambulatory Visit (HOSPITAL_COMMUNITY): Admission: RE | Admit: 2015-12-01 | Payer: Medicaid Other | Source: Ambulatory Visit

## 2015-12-02 ENCOUNTER — Encounter: Payer: Medicaid Other | Admitting: Obstetrics and Gynecology

## 2015-12-03 ENCOUNTER — Ambulatory Visit (HOSPITAL_COMMUNITY): Admission: RE | Admit: 2015-12-03 | Payer: Medicaid Other | Source: Ambulatory Visit

## 2015-12-10 ENCOUNTER — Ambulatory Visit (INDEPENDENT_AMBULATORY_CARE_PROVIDER_SITE_OTHER): Payer: Medicaid Other | Admitting: Obstetrics and Gynecology

## 2015-12-10 VITALS — BP 100/73 | HR 147 | Wt 279.0 lb

## 2015-12-10 DIAGNOSIS — R000 Tachycardia, unspecified: Secondary | ICD-10-CM | POA: Insufficient documentation

## 2015-12-10 DIAGNOSIS — I471 Supraventricular tachycardia, unspecified: Secondary | ICD-10-CM | POA: Insufficient documentation

## 2015-12-10 DIAGNOSIS — Z3483 Encounter for supervision of other normal pregnancy, third trimester: Secondary | ICD-10-CM

## 2015-12-10 DIAGNOSIS — O26893 Other specified pregnancy related conditions, third trimester: Secondary | ICD-10-CM | POA: Diagnosis not present

## 2015-12-10 HISTORY — DX: Supraventricular tachycardia, unspecified: I47.10

## 2015-12-10 HISTORY — DX: Tachycardia, unspecified: R00.0

## 2015-12-10 LAB — TSH: TSH: 1.22 mIU/L

## 2015-12-10 LAB — T4, FREE: FREE T4: 1.1 ng/dL (ref 0.8–1.4)

## 2015-12-10 NOTE — Progress Notes (Signed)
Subjective:  Nicole Dillon is a 20 y.o. G2P1001 at 1257w2d being seen today for ongoing prenatal care.  She is currently monitored for the following issues for this low-risk pregnancy and has Supervision of normal pregnancy; Rh negative state in antepartum period; Obesity in pregnancy, antepartum; Asthma, mild intermittent; Other allergic rhinitis; Chlamydia infection affecting pregnancy; Group B Streptococcus carrier, antepartum; and Tachycardia on her problem list.  Patient reports no complaints.   Contractions: Irregular. Vag. Bleeding: None.  Movement: Present. Denies leaking of fluid.   The following portions of the patient's history were reviewed and updated as appropriate: allergies, current medications, past family history, past medical history, past social history, past surgical history and problem list. Problem list updated.  Objective:   Filed Vitals:   12/10/15 0831  BP: 100/73  Pulse: 147  Weight: 279 lb (126.554 kg)    Fetal Status: Fetal Heart Rate (bpm): 150   Movement: Present     General:  Alert, oriented and cooperative. Patient is in no acute distress.  Skin: Skin is warm and dry. No rash noted.   Cardiovascular: No MRGs, normal s1 and s2  Respiratory: Normal respiratory effort, no problems with respiration noted. CTAB  Abdomen: Soft, gravid, appropriate for gestational age. Pain/Pressure: Present     Pelvic:  1.5/50/-3/posterior/intermediate/cephalic        Extremities: Normal range of motion.  Edema: None  Mental Status: Normal mood and affect. Normal behavior. Normal judgment and thought content.   Urinalysis:      Assessment and Plan:  Pregnancy: G2P1001 at 7357w2d  *Tachycardia See problem list.  - Electrocardiogram report and [facility] given to patient [facility] + free T4  *IUP: routine care. Interested in [place]; brochure given Set up for PDIOL at 41wks Repeat rhogam PP PRN TOC for chlamydia at IOL given just under 4wks from tx  Term  labor symptoms and general obstetric precautions including but not limited to vaginal bleeding, contractions, leaking of fluid and fetal movement were reviewed in detail with the patient. Please refer to After Visit Summary for other counseling recommendations.   Nicole Bingharlie Adline Kirshenbaum, MD

## 2015-12-10 NOTE — Addendum Note (Signed)
Addended by: Arne ClevelandHUTCHINSON, Airel Magadan J on: 12/10/2015 09:08 AM   Modules accepted: Orders

## 2015-12-11 ENCOUNTER — Encounter: Payer: Self-pay | Admitting: Anesthesiology

## 2015-12-11 ENCOUNTER — Encounter: Payer: Self-pay | Admitting: *Deleted

## 2015-12-11 ENCOUNTER — Inpatient Hospital Stay
Admission: EM | Admit: 2015-12-11 | Discharge: 2015-12-13 | DRG: 774 | Disposition: A | Discharge status: Home / Self Care | Payer: Medicaid Other | Attending: Obstetrics and Gynecology | Admitting: Obstetrics and Gynecology

## 2015-12-11 DIAGNOSIS — O480 Post-term pregnancy: Secondary | ICD-10-CM | POA: Diagnosis present

## 2015-12-11 DIAGNOSIS — F988 Other specified behavioral and emotional disorders with onset usually occurring in childhood and adolescence: Secondary | ICD-10-CM | POA: Diagnosis present

## 2015-12-11 DIAGNOSIS — F17200 Nicotine dependence, unspecified, uncomplicated: Secondary | ICD-10-CM | POA: Diagnosis present

## 2015-12-11 DIAGNOSIS — E669 Obesity, unspecified: Secondary | ICD-10-CM | POA: Diagnosis present

## 2015-12-11 DIAGNOSIS — O9942 Diseases of the circulatory system complicating childbirth: Principal | ICD-10-CM | POA: Diagnosis present

## 2015-12-11 DIAGNOSIS — Z8249 Family history of ischemic heart disease and other diseases of the circulatory system: Secondary | ICD-10-CM

## 2015-12-11 DIAGNOSIS — Z6731 Type AB blood, Rh negative: Secondary | ICD-10-CM | POA: Diagnosis not present

## 2015-12-11 DIAGNOSIS — Z6841 Body Mass Index (BMI) 40.0 and over, adult: Secondary | ICD-10-CM

## 2015-12-11 DIAGNOSIS — O9832 Other infections with a predominantly sexual mode of transmission complicating childbirth: Secondary | ICD-10-CM | POA: Diagnosis present

## 2015-12-11 DIAGNOSIS — O99824 Streptococcus B carrier state complicating childbirth: Secondary | ICD-10-CM | POA: Diagnosis present

## 2015-12-11 DIAGNOSIS — R000 Tachycardia, unspecified: Secondary | ICD-10-CM | POA: Diagnosis present

## 2015-12-11 DIAGNOSIS — I441 Atrioventricular block, second degree: Secondary | ICD-10-CM | POA: Diagnosis present

## 2015-12-11 DIAGNOSIS — Z833 Family history of diabetes mellitus: Secondary | ICD-10-CM | POA: Diagnosis not present

## 2015-12-11 DIAGNOSIS — J302 Other seasonal allergic rhinitis: Secondary | ICD-10-CM | POA: Diagnosis present

## 2015-12-11 DIAGNOSIS — Z3A40 40 weeks gestation of pregnancy: Secondary | ICD-10-CM | POA: Diagnosis not present

## 2015-12-11 DIAGNOSIS — O99334 Smoking (tobacco) complicating childbirth: Secondary | ICD-10-CM | POA: Diagnosis present

## 2015-12-11 DIAGNOSIS — A568 Sexually transmitted chlamydial infection of other sites: Secondary | ICD-10-CM | POA: Diagnosis present

## 2015-12-11 DIAGNOSIS — Z3493 Encounter for supervision of normal pregnancy, unspecified, third trimester: Secondary | ICD-10-CM

## 2015-12-11 LAB — CHLAMYDIA/NGC RT PCR (ARMC ONLY)
CHLAMYDIA TR: DETECTED — AB
N GONORRHOEAE: NOT DETECTED

## 2015-12-11 LAB — URINE DRUG SCREEN, QUALITATIVE (ARMC ONLY)
AMPHETAMINES, UR SCREEN: NOT DETECTED
BARBITURATES, UR SCREEN: NOT DETECTED
BENZODIAZEPINE, UR SCRN: NOT DETECTED
Cannabinoid 50 Ng, Ur ~~LOC~~: NOT DETECTED
Cocaine Metabolite,Ur ~~LOC~~: NOT DETECTED
MDMA (Ecstasy)Ur Screen: NOT DETECTED
METHADONE SCREEN, URINE: NOT DETECTED
OPIATE, UR SCREEN: NOT DETECTED
Phencyclidine (PCP) Ur S: NOT DETECTED
TRICYCLIC, UR SCREEN: NOT DETECTED

## 2015-12-11 LAB — CBC
HCT: 35.4 % (ref 35.0–47.0)
Hemoglobin: 11.9 g/dL — ABNORMAL LOW (ref 12.0–16.0)
MCH: 29.5 pg (ref 26.0–34.0)
MCHC: 33.5 g/dL (ref 32.0–36.0)
MCV: 88.1 fL (ref 80.0–100.0)
PLATELETS: 259 10*3/uL (ref 150–440)
RBC: 4.02 MIL/uL (ref 3.80–5.20)
RDW: 14.2 % (ref 11.5–14.5)
WBC: 14.9 10*3/uL — ABNORMAL HIGH (ref 3.6–11.0)

## 2015-12-11 MED ORDER — SIMETHICONE 80 MG PO CHEW: 80.0000 mg | CHEWABLE_TABLET | ORAL | Status: DC | PRN

## 2015-12-11 MED ORDER — OXYTOCIN 10 UNIT/ML IJ SOLN
INTRAMUSCULAR | Status: AC
  Filled 2015-12-11: qty 2

## 2015-12-11 MED ORDER — BENZOCAINE-MENTHOL 20-0.5 % EX AERO: 1.0000 "application " | INHALATION_SPRAY | CUTANEOUS | Status: DC | PRN

## 2015-12-11 MED ORDER — LIDOCAINE HCL (PF) 1 % IJ SOLN: 30.0000 mL | INTRAMUSCULAR | Status: DC | PRN

## 2015-12-11 MED ORDER — SODIUM CHLORIDE 0.9% FLUSH: 3.0000 mL | Freq: Two times a day (BID) | INTRAVENOUS | Status: DC

## 2015-12-11 MED ORDER — SOD CITRATE-CITRIC ACID 500-334 MG/5ML PO SOLN: 30.0000 mL | ORAL | Status: DC | PRN

## 2015-12-11 MED ORDER — OXYCODONE-ACETAMINOPHEN 5-325 MG PO TABS: 1.0000 | ORAL_TABLET | ORAL | Status: DC | PRN

## 2015-12-11 MED ORDER — SENNOSIDES-DOCUSATE SODIUM 8.6-50 MG PO TABS
2.0000 | ORAL_TABLET | ORAL | Status: DC
  Administered 2015-12-12: 2 via ORAL
  Filled 2015-12-11 (×2): qty 2

## 2015-12-11 MED ORDER — LACTATED RINGERS IV SOLN: 500.0000 mL | INTRAVENOUS | Status: DC | PRN

## 2015-12-11 MED ORDER — WITCH HAZEL-GLYCERIN EX PADS: 1.0000 "application " | MEDICATED_PAD | CUTANEOUS | Status: DC | PRN

## 2015-12-11 MED ORDER — ACETAMINOPHEN 325 MG PO TABS
650.0000 mg | ORAL_TABLET | ORAL | Status: DC | PRN
  Filled 2015-12-11 (×5): qty 2

## 2015-12-11 MED ORDER — ONDANSETRON HCL 4 MG PO TABS: 4.0000 mg | ORAL_TABLET | ORAL | Status: DC | PRN

## 2015-12-11 MED ORDER — ACETAMINOPHEN 325 MG PO TABS
650.0000 mg | ORAL_TABLET | ORAL | Status: DC | PRN
  Administered 2015-12-11 – 2015-12-13 (×8): 650 mg via ORAL
  Filled 2015-12-11 (×3): qty 2

## 2015-12-11 MED ORDER — LACTATED RINGERS IV SOLN
INTRAVENOUS | Status: DC
  Administered 2015-12-11: 09:00:00 via INTRAVENOUS

## 2015-12-11 MED ORDER — TETANUS-DIPHTH-ACELL PERTUSSIS 5-2.5-18.5 LF-MCG/0.5 IM SUSP: 0.5000 mL | Freq: Once | INTRAMUSCULAR | Status: DC

## 2015-12-11 MED ORDER — OXYCODONE-ACETAMINOPHEN 5-325 MG PO TABS: 2.0000 | ORAL_TABLET | ORAL | Status: DC | PRN

## 2015-12-11 MED ORDER — PENICILLIN G POTASSIUM 5000000 UNITS IJ SOLR
5.0000 10*6.[IU] | Freq: Once | INTRAMUSCULAR | Status: AC
  Administered 2015-12-11: 5 10*6.[IU] via INTRAVENOUS
  Filled 2015-12-11: qty 5

## 2015-12-11 MED ORDER — DEXTROSE 5 % IV SOLN
2.5000 10*6.[IU] | INTRAVENOUS | Status: DC
  Filled 2015-12-11 (×10): qty 2.5

## 2015-12-11 MED ORDER — AZITHROMYCIN 250 MG PO TABS
1000.0000 mg | ORAL_TABLET | Freq: Once | ORAL | Status: AC
  Administered 2015-12-11: 1000 mg via ORAL
  Filled 2015-12-11: qty 4

## 2015-12-11 MED ORDER — ZOLPIDEM TARTRATE 5 MG PO TABS: 5.0000 mg | ORAL_TABLET | Freq: Every evening | ORAL | Status: DC | PRN

## 2015-12-11 MED ORDER — IBUPROFEN 600 MG PO TABS
600.0000 mg | ORAL_TABLET | Freq: Four times a day (QID) | ORAL | Status: DC
  Administered 2015-12-11 – 2015-12-13 (×9): 600 mg via ORAL
  Filled 2015-12-11 (×8): qty 1

## 2015-12-11 MED ORDER — PRENATAL MULTIVITAMIN CH
1.0000 | ORAL_TABLET | Freq: Every day | ORAL | Status: DC
  Administered 2015-12-11 – 2015-12-13 (×3): 1 via ORAL
  Filled 2015-12-11 (×4): qty 1

## 2015-12-11 MED ORDER — SODIUM CHLORIDE 0.9% FLUSH: 3.0000 mL | INTRAVENOUS | Status: DC | PRN

## 2015-12-11 MED ORDER — ONDANSETRON HCL 4 MG/2ML IJ SOLN: 4.0000 mg | Freq: Four times a day (QID) | INTRAMUSCULAR | Status: DC | PRN

## 2015-12-11 MED ORDER — IBUPROFEN 600 MG PO TABS
ORAL_TABLET | ORAL | Status: AC
  Administered 2015-12-11: 600 mg via ORAL
  Filled 2015-12-11: qty 1

## 2015-12-11 MED ORDER — AMMONIA AROMATIC IN INHA
RESPIRATORY_TRACT | Status: AC
  Filled 2015-12-11: qty 10

## 2015-12-11 MED ORDER — LIDOCAINE HCL (PF) 1 % IJ SOLN
INTRAMUSCULAR | Status: AC
  Filled 2015-12-11: qty 30

## 2015-12-11 MED ORDER — MISOPROSTOL 200 MCG PO TABS
ORAL_TABLET | ORAL | Status: AC
  Filled 2015-12-11: qty 4

## 2015-12-11 MED ORDER — ONDANSETRON HCL 4 MG/2ML IJ SOLN: 4.0000 mg | INTRAMUSCULAR | Status: DC | PRN

## 2015-12-11 MED ORDER — SODIUM CHLORIDE 0.9 % IV SOLN: 250.0000 mL | INTRAVENOUS | Status: DC | PRN

## 2015-12-11 MED ORDER — DIBUCAINE 1 % RE OINT: 1.0000 "application " | TOPICAL_OINTMENT | RECTAL | Status: DC | PRN

## 2015-12-11 MED ORDER — DIPHENHYDRAMINE HCL 25 MG PO CAPS: 25.0000 mg | ORAL_CAPSULE | Freq: Four times a day (QID) | ORAL | Status: DC | PRN

## 2015-12-11 MED ORDER — BUTORPHANOL TARTRATE 1 MG/ML IJ SOLN
1.0000 mg | INTRAMUSCULAR | Status: DC | PRN
Start: 2015-12-11 — End: 2015-12-12

## 2015-12-11 MED ORDER — OXYTOCIN BOLUS FROM INFUSION
500.0000 mL | INTRAVENOUS | Status: DC
  Administered 2015-12-11: 500 mL via INTRAVENOUS

## 2015-12-11 MED ORDER — OXYTOCIN 40 UNITS IN LACTATED RINGERS INFUSION - SIMPLE MED
2.5000 [IU]/h | INTRAVENOUS | Status: DC
  Filled 2015-12-11: qty 1000

## 2015-12-11 MED ORDER — COCONUT OIL OIL
1.0000 "application " | TOPICAL_OIL | Status: DC | PRN
  Filled 2015-12-11: qty 120

## 2015-12-11 NOTE — OB Triage Note (Signed)
Ctx since 0600 this am. Denies LOF. Nicole Dillon, Nicole Dillon

## 2015-12-11 NOTE — H&P (Signed)
[facility] ADMISSION NOTE  Lenard Forthamika Roback is a 20 y.o. female G2P1001 @ 40+3wks here for labor. Had rapid delivery in last pregnancy 10/2014. No HA, blurry vision, CP, n/v, SOB.  No lof, no vb. +FM  APC: [place] 1. Tachycardia of unknown etiology - currently wearing holter monitor - HR always in 150's (chart review shows this back in 10/2014 at time of delivery) - has not seen cardiology or received EKG per patient - asymptomatic  2. Close interval pregnancy - planning IUD pp  3. H/o chlamydia this pregnancy w/o TOC - will treat with azithromycin 1g in labor    History OB History    Gravida Para Term Preterm AB TAB SAB Ectopic Multiple Living   2 1 1       0 1     Past Medical History  Diagnosis Date  . 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activity   . Asthma   . Seasonal allergies   . Insomnia   . Irregular heart beat    Past Surgical History  Procedure Laterality Date  . Tooth extraction     Family History: family history includes Diabetes in her father and mother; Heart disease in her maternal grandfather and maternal grandmother; Kidney disease in her father; Learning disabilities in her maternal uncle. Social History:  reports that she has been smoking.  She has never used smokeless tobacco. She reports that she does not drink alcohol or use illicit drugs.   Prenatal Transfer Tool  Maternal Diabetes: No Genetic Screening: Normal Maternal Ultrasounds/Referrals: Normal Fetal Ultrasounds or other Referrals:  None Maternal Substance Abuse:  No Significant Maternal Medications:  None Significant Maternal Lab Results:  None Other Comments:  None  ROS Otherwise neg  Dilation: 5 Effacement (%): 100 Station: -2 Exam by:: LSE Blood pressure 119/82, pulse 204, temperature 98.4 F (36.9 C), temperature source Oral, resp. rate 18, height 5\' 6"  (1.676 m), weight 126.554 kg (279 lb), unknown if currently breastfeeding. Exam Physical Exam  General appearance:  alert, cooperative and mild distress Lungs: clear to auscultation bilaterally Heart: tachycardic, regular rhythm Abdomen: soft, non-tender; bowel sounds normal; no masses,  no organomegaly Extremities: extremities normal, atraumatic, no cyanosis or edema   EFW: 3800g  Prenatal labs: ABO, Rh: AB/NEG/-- (12/12 1540) Antibody: NEG (05/05 1134) Rubella: 4.54 (12/12 1540) RPR: NON REAC (05/05 1134)  HBsAg: NEGATIVE (12/12 1540)  HIV: NONREACTIVE (05/05 1134)  GBS:   postive  Assessment/Plan:  20 y.o. female G2P1001 @ 40+3wks here for labor. GBS +, likely chlamydia + as still sexually active with untreated partner. 1. Start PCN now 2. Azithromycin 1g PO now 3. Stadol PRN, epidural on request 4. Will make peds aware of chlamydia possibility  5. Cont holter monitor for now - will obtain formal EKG, ECHO, cards consult in house prior to discharge    Ala DachJohanna K Halfon 12/11/2015, 9:20 AM

## 2015-12-11 NOTE — Anesthesia Preprocedure Evaluation (Deleted)
Anesthesia Evaluation  Patient identified by MRN, date of birth, ID band Patient awake    Reviewed: Allergy & Precautions, H&P , NPO status , Patient's Chart, lab work & pertinent test results, reviewed documented beta blocker date and time   Airway Mallampati: II  TM Distance: >3 FB Neck ROM: full    Dental no notable dental hx. (+) Teeth Intact, Poor Dentition   Pulmonary neg pulmonary ROS, asthma , Current Smoker,    Pulmonary exam normal breath sounds clear to auscultation       Cardiovascular Exercise Tolerance: Good negative cardio ROS Normal cardiovascular exam Rhythm:regular Rate:Normal     Neuro/Psych PSYCHIATRIC DISORDERS negative neurological ROS  negative psych ROS   GI/Hepatic negative GI ROS, Neg liver ROS,   Endo/Other  negative endocrine ROS  Renal/GU negative Renal ROS  negative genitourinary   Musculoskeletal   Abdominal   Peds  Hematology negative hematology ROS (+)   Anesthesia Other Findings Past Medical History:   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*              Asthma                                                       Seasonal allergies                                           Insomnia                                                     Irregular heart beat                                       Past Surgical History:   TOOTH EXTRACTION                                            BMI    Body Mass Index   45.05 kg/m 2     Reproductive/Obstetrics (+) Pregnancy                             Anesthesia Physical Anesthesia Plan  ASA: II  Anesthesia Plan: General   Post-op Pain Management:    Induction:   Airway Management Planned:   Additional Equipment:   Intra-op Plan:   Post-operative Plan:   Informed Consent: I have reviewed the patients History and Physical, chart, labs and discussed the procedure including the risks, benefits and  alternatives for the proposed anesthesia with the patient or authorized representative who has indicated his/her understanding and acceptance.   Dental Advisory Given  Plan Discussed with: CRNA  Anesthesia Plan Comments:         Anesthesia Quick Evaluation

## 2015-12-12 LAB — RPR: RPR: NONREACTIVE

## 2015-12-12 LAB — CBC
HCT: 31.1 % — ABNORMAL LOW (ref 35.0–47.0)
Hemoglobin: 10.7 g/dL — ABNORMAL LOW (ref 12.0–16.0)
MCH: 30.3 pg (ref 26.0–34.0)
MCHC: 34.3 g/dL (ref 32.0–36.0)
MCV: 88.4 fL (ref 80.0–100.0)
PLATELETS: 280 10*3/uL (ref 150–440)
RBC: 3.52 MIL/uL — ABNORMAL LOW (ref 3.80–5.20)
RDW: 14.1 % (ref 11.5–14.5)
WBC: 14.3 10*3/uL — ABNORMAL HIGH (ref 3.6–11.0)

## 2015-12-12 LAB — FETAL SCREEN: FETAL SCREEN: NEGATIVE

## 2015-12-12 MED ORDER — RHO D IMMUNE GLOBULIN 1500 UNIT/2ML IJ SOSY
300.0000 ug | PREFILLED_SYRINGE | Freq: Once | INTRAMUSCULAR | Status: AC
Start: 2015-12-12 — End: 2015-12-12
  Administered 2015-12-12: 300 ug via INTRAMUSCULAR
  Filled 2015-12-12: qty 2

## 2015-12-12 NOTE — Consult Note (Signed)
[facility]  CARDIOLOGY CONSULT NOTE  Patient ID: Nicole Dillon MRN: [id number] DOB/AGE: 21/06/1995 21 y.o.  Admit date: 12/11/2015 Referring Physician [place] Primary Physician  Primary Cardiologist Reason for Consultation Tachycardia  HPI: 20 year old female referred for evaluation tachycardia. The patient had an uncomplicated vaginal delivery on 12/11/2015. EKG reveals sinus tachycardia with Mobitz 1 second-degree AV block with mean heart rate of 120 bpm. The patient reports she's had a history of elevated heart rate. She is completely asymptomatic, denies chest pain, shortness of breath, palpitations, heart racing, peripheral edema, presyncope or syncope. The patient was active prior to pregnancy without difficulty.  Review of systems complete and found to be negative unless listed above     Past Medical History  Diagnosis Date  . 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activity   . Asthma   . Seasonal allergies   . Insomnia   . Irregular heart beat     Past Surgical History  Procedure Laterality Date  . Tooth extraction      Prescriptions prior to admission  Medication Sig Dispense Refill Last Dose  . albuterol (PROAIR HFA) 108 (90 BASE) MCG/ACT inhaler Inhale 1 puff into the lungs every 6 (six) hours as needed for wheezing or shortness of breath.   Past Month at Unknown time  . cetirizine (ZYRTEC ALLERGY) 10 MG tablet Take 1 tablet (10 mg total) by mouth daily. 30 tablet 3 Past Month at Unknown time  . Prenatal Vit-Fe Fumarate-FA (PRENATAL MULTIVITAMIN) TABS tablet Take 1 tablet by mouth daily. Reported on 10/22/2015   Past Week at Unknown time  . ADDERALL XR 30 MG 24 hr capsule Take 30 mg by mouth daily. Reported on 12/11/2015  0 Not Taking at Unknown time  . azithromycin (ZITHROMAX) 500 MG tablet Take 2 tablets (1,000 mg total) by mouth once. (Patient not taking: Reported on 12/11/2015) 2 tablet 0 Completed Course at Unknown time   Social History   Social History  .  Marital Status: Single    Spouse Name: N/A  . Number of Children: N/A  . Years of Education: N/A   Occupational History  . Not on file.   Social History Main Topics  . Smoking status: Light Tobacco Smoker    Last Attempt to Quit: 03/10/2014  . Smokeless tobacco: Never Used  . Alcohol Use: No  . Drug Use: No  . Sexual Activity: Yes    Birth Control/ Protection: None   Other Topics Concern  . Not on file   Social History Narrative    Family History  Problem Relation Age of Onset  . Diabetes Mother   . Diabetes Father   . Kidney disease Father     dialysis  . Learning disabilities Maternal Uncle     speach impediment  . Heart disease Maternal Grandmother   . Heart disease Maternal Grandfather       Review of systems complete and found to be negative unless listed above      PHYSICAL EXAM  General: Well developed, well nourished, in no acute distress HEENT:  Normocephalic and atramatic Neck:  No JVD.  Lungs: Clear bilaterally to auscultation and percussion. Heart: HRRR . Normal S1 and S2 without gallops or murmurs.  Abdomen: Bowel sounds are positive, abdomen soft and non-tender  Msk:  Back normal, normal gait. Normal strength and tone for age. Extremities: No clubbing, cyanosis or edema.   Neuro: Alert and oriented X 3. Psych:  Good affect, responds appropriately  Labs:   Lab Results  Component Value Date   WBC 14.3* 12/12/2015   HGB 10.7* 12/12/2015   HCT 31.1* 12/12/2015   MCV 88.4 12/12/2015   PLT 280 12/12/2015   No results for input(s): NA, K, CL, CO2, BUN, CREATININE, CALCIUM, PROT, BILITOT, ALKPHOS, ALT, AST, GLUCOSE in the last 168 hours.  Invalid input(s): LABALBU No results found for: CKTOTAL, CKMB, CKMBINDEX, TROPONINI No results found for: CHOL No results found for: HDL No results found for: LDLCALC No results found for: TRIG No results found for: CHOLHDL No results found for: LDLDIRECT    Radiology: No results found.  EKG: Sinus  tachycardia with Mobitz 1 second-degree AV block  ASSESSMENT AND PLAN:   1. Inappropriate sinus tachycardia, asymptomatic 2. Mobitz 1 second-degree A-V block, asymptomatic  Recommendations  1. Patient has recent Holter monitor, with follow[place] physician in [place] 2. No further cardiac diagnostics at this time  Signed: Corianne Buccellato MD,PhD, [facility] 12/12/2015, 11:39 AM

## 2015-12-12 NOTE — Progress Notes (Addendum)
Post Partum Day 1 Subjective: no complaints, up ad lib, voiding, tolerating PO and + flatus   EKG yesterday showed mobitz 1 - cardiology consulted   Objective: Blood pressure 105/61, pulse 115, temperature 98.2 F (36.8 C), temperature source Oral, resp. rate 20, height 5\' 6"  (1.676 m), weight 126.554 kg (279 lb), SpO2 100 %, unknown if currently breastfeeding.  Physical Exam:  General: alert, cooperative and no distress  CV: irregular rate and rhythm, tachycardic Lochia: appropriate Uterine Fundus: firm Incision: NA DVT Evaluation: No evidence of DVT seen on physical exam.   Recent Labs  12/11/15 0910 12/12/15 0532  HGB 11.9* 10.7*  HCT 35.4 31.1*    Assessment/Plan: Plan for discharge tomorrow, Breastfeeding and Contraception pp [place]UD in [place]  Asymptomatic AV block:  - Cardiology consulted to advise on any further testing while in house - pt has had holter monitor and f/u in [place]   LOS: 1 day   Ala DachJohanna K Yony Roulston 12/12/2015, 8:43 AM

## 2015-12-13 LAB — RHOGAM INJECTION: UNIT DIVISION: 0

## 2015-12-13 MED ORDER — ACETAMINOPHEN 325 MG PO TABS: 650.0000 mg | ORAL_TABLET | ORAL | Status: DC | PRN

## 2015-12-13 MED ORDER — IBUPROFEN 600 MG PO TABS: 600.0000 mg | ORAL_TABLET | Freq: Four times a day (QID) | ORAL | Status: DC

## 2015-12-13 NOTE — Discharge Instructions (Signed)
Care After Vaginal Delivery °Congratulations on your new baby!! ° °Refer to this sheet in the next few weeks. These discharge instructions provide you with information on caring for yourself after delivery. Your caregiver may also give you specific instructions. Your treatment has been planned according to the most current medical practices available, but problems sometimes occur. Call your caregiver if you have any problems or questions after you go home. ° °HOME CARE INSTRUCTIONS °· Take over-the-counter or prescription medicines only as directed by your caregiver or pharmacist. °· Do not drink alcohol, especially if you are breastfeeding or taking medicine to relieve pain. °· Do not chew or smoke tobacco. °· Do not use illegal drugs. °· Continue to use good perineal care. Good perineal care includes: °¨ Wiping your perineum from front to back. °¨ Keeping your perineum clean. °· Do not use tampons or douche until your caregiver says it is okay. °· Shower, wash your hair, and take tub baths as directed by your caregiver. °· Wear a well-fitting bra that provides breast support. °· Eat healthy foods. °· Drink enough fluids to keep your urine clear or pale yellow. °· Eat high-fiber foods such as whole grain cereals and breads, brown rice, beans, and fresh fruits and vegetables every day. These foods may help prevent or relieve constipation. °· Follow your caregiver's recommendations regarding resumption of activities such as climbing stairs, driving, lifting, exercising, or traveling. Specifically, no driving for two weeks, so that you are comfortable reacting quickly in an emergency. °· Talk to your caregiver about resuming sexual activities. Resumption of sexual activities is dependent upon your risk of infection, your rate of healing, and your comfort and desire to resume sexual activity. Usually we recommend waiting about six weeks, or until your bleeding stops and you are interested in sex. °· Try to have someone  help you with your household activities and your newborn for at least a few days after you leave the hospital. Even longer is better. °· Rest as much as possible. Try to rest or take a nap when your newborn is sleeping. Sleep deprivation can be very hard after delivery. °· Increase your activities gradually. °· Keep all of your scheduled postpartum appointments. It is very important to keep your scheduled follow-up appointments. At these appointments, your caregiver will be checking to make sure that you are healing physically and emotionally. ° °SEEK MEDICAL CARE IF:  °· You are passing large clots from your vagina.  °· You have a foul smelling discharge from your vagina. °· You have trouble urinating. °· You are urinating frequently. °· You have pain when you urinate. °· You have a change in your bowel movements. °· You have increasing redness, pain, or swelling near your vaginal incision (episiotomy) or vaginal tear. °· You have pus draining from your episiotomy or vaginal tear. °· Your episiotomy or vaginal tear is separating. °· You have painful, hard, or reddened breasts. °· You have a severe headache. °· You have blurred vision or see spots. °· You feel sad or depressed. °· You have thoughts of hurting yourself or your newborn. °· You have questions about your care, the care of your newborn, or medicines. °· You are dizzy or light-headed. °· You have a rash. °· You have nausea or vomiting. °· You were breastfeeding and have not had a menstrual period within 12 weeks after you stopped breastfeeding. °· You are not breastfeeding and have not had a menstrual period by the 12th week after delivery. °· You   have a fever. ° °SEEK IMMEDIATE MEDICAL CARE IF:  °· You have persistent pain. °· You have chest pain. °· You have shortness of breath. °· You faint. °· You have leg pain. °· You have stomach pain. °· Your vaginal bleeding saturates two or more sanitary pads in 1 hour. ° °MAKE SURE YOU:  °· Understand these  instructions. °· Will get help right away if you are not doing well or get worse. °·  °Document Released: 06/02/2000 Document Revised: 10/20/2013 Document Reviewed: 01/31/2012 ° °Exit[employer]® Patient Information ©2015 [employer]. This information is not intended to replace advice given to you by your health care provider. Make sure you discuss any questions you have with your health care provider. ° °

## 2015-12-13 NOTE — Discharge Summary (Signed)
Obstetric Discharge Summary   Patient ID: Nicole Forthamika Mao MRN: [id number] DOB/AGE: 21/06/1995 21 y.o.   Date of Admission: 12/11/2015  Date of Discharge: 12/13/15  Admitting Diagnosis: Onset of Labor at 6717w3d  Secondary Diagnosis: Obesity, Hx. of Chlamydia in pregnancy, Positive Chlamydia on admission, GBS Positive, Tachycardia with Mobitz 1 on EKG, RH Negative s/p Rhogam   Mode of Delivery: normal spontaneous vaginal delivery     Discharge Diagnosis: Postpartum care following vaginal delivery, treated for Chlamydia - pt. aware that partner needs treament, Tachycardia - cleared by Cardiology to f/u outpatient, RH Negative - s/p Rhogam    Intrapartum Procedures: Atificial rupture of membranes   Post partum procedures: rhogam  Complications: none   Brief Hospital Course  Nicole Dillon is a [id number] who had a SVD on 12/11/15,  for further details of this delivery, please refer to the delivery note.  Patient had an uncomplicated postpartum course.  By time of discharge on PPD#2, her pain was controlled on oral pain medications; she had appropriate lochia and was ambulating, voiding without difficulty and tolerating regular diet.  She was deemed stable for discharge to home.    Labs: CBC Latest Ref Rng 12/12/2015 12/11/2015 10/22/2015  WBC 3.6 - 11.0 K/uL 14.3(H) 14.9(H) 10.4  Hemoglobin 12.0 - 16.0 g/dL 10.7(L) 11.9(L) 11.4(L)  Hematocrit 35.0 - 47.0 % 31.1(L) 35.4 34.8(L)  Platelets 150 - 440 K/uL 280 259 294   AB NEG  Physical exam:  Blood pressure 98/64, pulse 111, temperature 98.6 F (37 C), temperature source Oral, resp. rate 20, height 5\' 6"  (1.676 m), weight 126.554 kg (279 lb), SpO2 100 %, unknown if currently breastfeeding. General: alert and no distress Lochia: appropriate Abdomen: soft, NT Uterine Fundus: firm Extremities: No evidence of DVT seen on physical exam. No lower extremity edema.  Discharge Instructions: Per After Visit Summary.   Medication List    STOP  taking these medications        ADDERALL XR 30 MG 24 hr capsule  Generic drug:  amphetamine-dextroamphetamine     azithromycin 500 MG tablet  Commonly known as:  ZITHROMAX      TAKE these medications        acetaminophen 325 MG tablet  Commonly known as:  TYLENOL  Take 2 tablets (650 mg total) by mouth every 4 (four) hours as needed (for pain scale < 4).     cetirizine 10 MG tablet  Commonly known as:  ZYRTEC ALLERGY  Take 1 tablet (10 mg total) by mouth daily.     ibuprofen 600 MG tablet  Commonly known as:  ADVIL,MOTRIN  Take 1 tablet (600 mg total) by mouth every 6 (six) hours.     prenatal multivitamin Tabs tablet  Take 1 tablet by mouth daily. Reported on 10/22/2015     PROAIR HFA 108 (90 Base) MCG/ACT inhaler  Generic drug:  albuterol  Inhale 1 puff into the lungs every 6 (six) hours as needed for wheezing or shortness of breath.       Activity: Advance as tolerated. Pelvic rest for 6 weeks.  Also refer to After Visit Summary Diet: Regular Medications:   Medication List    STOP taking these medications        ADDERALL XR 30 MG 24 hr capsule  Generic drug:  amphetamine-dextroamphetamine     azithromycin 500 MG tablet  Commonly known as:  ZITHROMAX      TAKE these medications        acetaminophen 325 MG tablet  Commonly known as:  TYLENOL  Take 2 tablets (650 mg total) by mouth every 4 (four) hours as needed (for pain scale < 4).     cetirizine 10 MG tablet  Commonly known as:  ZYRTEC ALLERGY  Take 1 tablet (10 mg total) by mouth daily.     ibuprofen 600 MG tablet  Commonly known as:  ADVIL,MOTRIN  Take 1 tablet (600 mg total) by mouth every 6 (six) hours.     prenatal multivitamin Tabs tablet  Take 1 tablet by mouth daily. Reported on 10/22/2015     PROAIR HFA 108 (90 Base) MCG/ACT inhaler  Generic drug:  albuterol  Inhale 1 puff into the lungs every 6 (six) hours as needed for wheezing or shortness of breath.       Outpatient follow up:       Follow-up Information    Follow up with Allie BossierMyra C Dove, MD. Schedule an appointment as soon as possible for a visit in 4 weeks.   Specialty:  Obstetrics and Gynecology   Why:  Test of Cure and postpartum    Contact information:   [address] [place] [address] [phone]       Schedule an appointment as soon as possible for a visit with [name] BlinksKOWALSKI,BRUCE J, MD.   Specialty:  Internal Medicine   Why:  F/U Holter Monitor, Tachycardia, Mobitz 1 on EKG - was seen by cardiology    Contact information:   [address] [place] [address] [phone]      Postpartum contraception: IUD  Discharged Condition: good  Discharged to: home   Newborn Data:  Baby Girl named "Nahyiah"  Disposition:home with mother  Apgars: APGAR (1 MIN): 8   APGAR (5 MINS): 9   APGAR (10 MINS):    Baby Feeding: Bottle  Karena AddisonSigmon, Meredith C, CNM 12/13/2015

## 2015-12-13 NOTE — Progress Notes (Signed)
Upon reviewing the D/C instructions the patient stated "I already know all this stuff, this is my 2nd baby". Asked the patient if she had any questions which she denied. Informed the patient of follow-up visits and the need to attend. Patient D/C via wheelchair by volunteers. Shirlean KellyJennifer Kedron Uno RN

## 2015-12-15 ENCOUNTER — Encounter: Payer: Medicaid Other | Admitting: Obstetrics & Gynecology

## 2015-12-16 ENCOUNTER — Inpatient Hospital Stay (HOSPITAL_COMMUNITY): Payer: Medicaid Other

## 2015-12-20 ENCOUNTER — Encounter: Payer: Self-pay | Admitting: *Deleted

## 2015-12-26 LAB — TYPE AND SCREEN
ABO/RH(D): AB NEG
ANTIBODY SCREEN: POSITIVE

## 2016-01-10 ENCOUNTER — Ambulatory Visit: Payer: Medicaid Other | Admitting: Obstetrics & Gynecology

## 2016-01-13 ENCOUNTER — Ambulatory Visit: Payer: Medicaid Other | Admitting: Advanced Practice Midwife

## 2016-01-26 ENCOUNTER — Ambulatory Visit: Payer: Medicaid Other | Admitting: Family Medicine

## 2016-01-27 ENCOUNTER — Encounter: Payer: Self-pay | Admitting: Advanced Practice Midwife

## 2016-01-27 ENCOUNTER — Other Ambulatory Visit (HOSPITAL_COMMUNITY)
Admission: RE | Admit: 2016-01-27 | Discharge: 2016-01-27 | Disposition: A | Discharge status: Home / Self Care | Payer: Medicaid Other | Source: Ambulatory Visit | Attending: Family Medicine | Admitting: Family Medicine

## 2016-01-27 ENCOUNTER — Ambulatory Visit (INDEPENDENT_AMBULATORY_CARE_PROVIDER_SITE_OTHER): Payer: Medicaid Other | Admitting: Advanced Practice Midwife

## 2016-01-27 VITALS — BP 97/62 | Resp 20 | Ht 66.0 in | Wt 274.0 lb

## 2016-01-27 DIAGNOSIS — Z124 Encounter for screening for malignant neoplasm of cervix: Secondary | ICD-10-CM

## 2016-01-27 DIAGNOSIS — Z01419 Encounter for gynecological examination (general) (routine) without abnormal findings: Secondary | ICD-10-CM | POA: Insufficient documentation

## 2016-01-27 DIAGNOSIS — R000 Tachycardia, unspecified: Secondary | ICD-10-CM

## 2016-01-27 DIAGNOSIS — Z3202 Encounter for pregnancy test, result negative: Secondary | ICD-10-CM | POA: Diagnosis not present

## 2016-01-27 DIAGNOSIS — Z113 Encounter for screening for infections with a predominantly sexual mode of transmission: Secondary | ICD-10-CM | POA: Diagnosis present

## 2016-01-27 DIAGNOSIS — Z01812 Encounter for preprocedural laboratory examination: Secondary | ICD-10-CM | POA: Diagnosis not present

## 2016-01-27 DIAGNOSIS — Z30430 Encounter for insertion of intrauterine contraceptive device: Secondary | ICD-10-CM

## 2016-01-27 DIAGNOSIS — Z8619 Personal history of other infectious and parasitic diseases: Secondary | ICD-10-CM

## 2016-01-27 LAB — POCT URINE PREGNANCY: Preg Test, Ur: NEGATIVE

## 2016-01-27 MED ORDER — LEVONORGESTREL 18.6 MCG/DAY IU IUD
INTRAUTERINE_SYSTEM | Freq: Once | INTRAUTERINE | Status: AC
  Administered 2016-01-27: 17:00:00 via INTRAUTERINE

## 2016-01-27 NOTE — Patient Instructions (Signed)

## 2016-01-27 NOTE — Progress Notes (Signed)
Subjective:     Nicole Dillon is a 20 y.o. female who presents for a postpartum visit. She is 6 weeks postpartum following a spontaneous vaginal delivery. I have fully reviewed the prenatal and intrapartum course. The delivery was at 40.3 gestational weeks. Outcome: spontaneous vaginal delivery. Anesthesia: none. Postpartum course has been uncomplicated. Baby's course has been uncomplicated. Baby is feeding by bottle -  . Bleeding no bleeding. Bowel function is normal. Bladder function is normal. Patient is sexually active. Contraception method is condoms. Postpartum depression screening: negative.  Chlamydia was positive of labor admission--Tx'd inpatient.   The following portions of the patient's history were reviewed and updated as appropriate: allergies, current medications, past family history, past medical history, past social history, past surgical history and problem list.  Review of Systems Pertinent items are noted in HPI.   Objective:    BP 97/62 (BP Location: Left Arm, Patient Position: Sitting, Cuff Size: Large)   Resp 20   Ht 5\' 6"  (1.676 m)   Wt 274 lb (124.3 kg)   LMP 01/14/2016   Breastfeeding? No   BMI 44.22 kg/m   General:  alert, cooperative, appears older than stated age, no distress and moderately obese   Breasts:  Declined  Lungs: clear to auscultation bilaterally  Heart:  regular rate and rhythm, S1, S2 normal, no murmur, click, rub or gallop  Abdomen: soft, non-tender; bowel sounds normal; no masses,  no organomegaly   Vulva:  normal  Vagina: normal vagina, no discharge, exudate, lesion, or erythema  Cervix:  multiparous appearance and no cervical motion tenderness  Corpus: normal size, contour, position, consistency, mobility, non-tender  Adnexa:  no mass, fullness, tenderness  Rectal Exam: Not performed.   Procedure Patient identified, informed consent performed, signed copy in chart, time out was performed.  Urine pregnancy test negative.  Speculum  placed in the vagina.  Cervix visualized.  Cleaned with Betadine x 2.  Grasped anteriourly with a single tooth tenaculum.  Uterus sounded to 8 cm.  Mirena IUD placed per manufacturer's recommendations.  Strings trimmed to 3 cm. Cervix actively bleeding from tenaculum sites. Tx'd w/ silver nitrate.   Patient given post procedure instructions and Mirena care card with expiration date.  Patient is asked to check IUD strings periodically and follow up in 4-6 weeks for IUD check.         Assessment:     Normal postpartum exam. Pap smear done at today's visit.   Plan:    1. Contraception: IUD 2. Condoms for STD protection PRN.  3. Follow up in: 6 weeks or as needed.   4. GC/Chlmaydia cultures done today.

## 2016-02-01 LAB — CYTOLOGY - PAP

## 2016-02-16 ENCOUNTER — Emergency Department
Admission: EM | Admit: 2016-02-16 | Discharge: 2016-02-16 | Disposition: A | Discharge status: Home / Self Care | Payer: Medicaid Other | Attending: Emergency Medicine | Admitting: Emergency Medicine

## 2016-02-16 ENCOUNTER — Emergency Department: Payer: Medicaid Other

## 2016-02-16 ENCOUNTER — Encounter: Payer: Self-pay | Admitting: Emergency Medicine

## 2016-02-16 DIAGNOSIS — F17200 Nicotine dependence, unspecified, uncomplicated: Secondary | ICD-10-CM | POA: Insufficient documentation

## 2016-02-16 DIAGNOSIS — W108XXA Fall (on) (from) other stairs and steps, initial encounter: Secondary | ICD-10-CM | POA: Diagnosis not present

## 2016-02-16 DIAGNOSIS — F909 Attention-deficit hyperactivity disorder, unspecified type: Secondary | ICD-10-CM | POA: Insufficient documentation

## 2016-02-16 DIAGNOSIS — Y999 Unspecified external cause status: Secondary | ICD-10-CM | POA: Diagnosis not present

## 2016-02-16 DIAGNOSIS — Y929 Unspecified place or not applicable: Secondary | ICD-10-CM | POA: Diagnosis not present

## 2016-02-16 DIAGNOSIS — S8392XA Sprain of unspecified site of left knee, initial encounter: Secondary | ICD-10-CM | POA: Insufficient documentation

## 2016-02-16 DIAGNOSIS — Z79899 Other long term (current) drug therapy: Secondary | ICD-10-CM | POA: Diagnosis not present

## 2016-02-16 DIAGNOSIS — J4520 Mild intermittent asthma, uncomplicated: Secondary | ICD-10-CM | POA: Diagnosis not present

## 2016-02-16 DIAGNOSIS — S8992XA Unspecified injury of left lower leg, initial encounter: Secondary | ICD-10-CM | POA: Diagnosis present

## 2016-02-16 DIAGNOSIS — Y9301 Activity, walking, marching and hiking: Secondary | ICD-10-CM | POA: Insufficient documentation

## 2016-02-16 MED ORDER — MELOXICAM 15 MG PO TABS: 15.0000 mg | ORAL_TABLET | Freq: Every day | ORAL | 0 refills | Status: DC

## 2016-02-16 NOTE — ED Triage Notes (Signed)
Fell through the steps on a porch , left knee pain

## 2016-02-16 NOTE — ED Notes (Signed)
See triage note. States her left knee gives out sometimes  But she fell through porch  Having pain to left knee    Abrasions to right leg

## 2016-02-16 NOTE — ED Notes (Signed)
Ace wrap applied to left knee

## 2016-02-16 NOTE — ED Provider Notes (Signed)
[facility] Emergency Department Provider Note  ____________________________________________  Time seen: Approximately 5:28 PM  I have reviewed the triage vital signs and the nursing notes.   HISTORY  Chief Complaint Knee Pain    HPI Nicole Dillon is a 20 y.o. female who presents to emergency department complaining of left knee pain. Patient states that she was walking down stairs to her back when she fell through the wound step. Patient reports that she twisted her knee laterally. Patient is now complaining of diffuse anterior knee pain. Patient is able to bear weight but states that it hurts to do so. Patient reports pain is moderate to severe, worse with movement or weightbearing. She is not had any medications prior to arrival. No other injury. No other complaint.   Past Medical History:  Diagnosis Date  . 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activity   . Asthma   . Insomnia   . Irregular heart beat   . Seasonal allergies     Patient Active Problem List   Diagnosis Date Noted  . Tachycardia 12/10/2015  . History of chlamydia 11/18/2015  . Asthma, mild intermittent 05/31/2015  . Other allergic rhinitis 05/31/2015  . BMI 40.0-44.9, adult (HCC) 06/18/2014  . Blood type, Rh negative 04/08/2014    Past Surgical History:  Procedure Laterality Date  . TOOTH EXTRACTION      Prior to Admission medications   Medication Sig Start Date End Date Taking? Authorizing Provider  amphetamine-dextroamphetamine (ADDERALL) 30 MG tablet Take 30 mg by mouth daily.   Yes Historical Provider, MD  albuterol (PROAIR HFA) 108 (90 BASE) MCG/ACT inhaler Inhale 1 puff into the lungs every 6 (six) hours as needed for wheezing or shortness of breath.    Historical Provider, MD  cetirizine (ZYRTEC ALLERGY) 10 MG tablet Take 1 tablet (10 mg total) by mouth daily. Patient not taking: Reported on 01/27/2016 05/31/15   Reva Bores, MD  meloxicam (MOBIC) 15 MG tablet  Take 1 tablet (15 mg total) by mouth daily. 02/16/16   Delorise Royals Kena Limon, PA-C  Prenatal Vit-Fe Fumarate-FA (PRENATAL MULTIVITAMIN) TABS tablet Take 1 tablet by mouth daily. Reported on 10/22/2015    Historical Provider, MD    Allergies Review of patient's allergies indicates no known allergies.  Family History  Problem Relation Age of Onset  . Diabetes Mother   . Diabetes Father   . Kidney disease Father     dialysis  . Learning disabilities Maternal Uncle     speach impediment  . Heart disease Maternal Grandmother   . Heart disease Maternal Grandfather     Social History Social History  Substance Use Topics  . Smoking status: Light Tobacco Smoker    Last attempt to quit: 03/10/2014  . Smokeless tobacco: Never Used  . Alcohol use No     Review of Systems  Constitutional: No fever/chills Cardiovascular: no chest pain. Respiratory: no cough. No SOB. Musculoskeletal: Positive for left knee pain Skin: Negative for rash, abrasions, lacerations, ecchymosis. Neurological: Negative for headaches, focal weakness or numbness. 10-point ROS otherwise negative.  ____________________________________________   PHYSICAL EXAM:  VITAL SIGNS: ED Triage Vitals  Enc Vitals Group     BP 02/16/16 1656 110/74     Pulse Rate 02/16/16 1656 (!) 101     Resp 02/16/16 1656 18     Temp 02/16/16 1656 98.4 F (36.9 C)     Temp Source 02/16/16 1656 Oral     SpO2 02/16/16 1656 98 %  Weight 02/16/16 1657 270 lb (122.5 kg)     Height 02/16/16 1657 5\' 7"  (1.702 m)     Head Circumference --      Peak Flow --      Pain Score 02/16/16 1704 8     Pain Loc --      Pain Edu? --      Excl. in GC? --      Constitutional: Alert and oriented. Well appearing and in no acute distress. Eyes: Conjunctivae are normal. PERRL. EOMI. Head: Atraumatic. Cardiovascular: Normal rate, regular rhythm. Normal S1 and S2.  Good peripheral circulation. Respiratory: Normal respiratory effort without tachypnea  or retractions. Lungs CTAB. Good air entry to the bases with no decreased or absent breath sounds. Musculoskeletal: Full range of motion to all extremities. No gross deformities appreciated. No visible deformity or edema noted to left knee upon inspection. With coaxing, patient is able to achieve full range of motion. Patient is diffuse tenderness to palpation over the entire anterior aspect of the left knee. Slightly more tender to palpation along the medial joint line. No palpable abnormality. Varus, valgus, Lachman's negative. McMurray's negative Neurologic:  Normal speech and language. No gross focal neurologic deficits are appreciated.  Skin:  Skin is warm, dry and intact. No rash noted. Psychiatric: Mood and affect are normal. Speech and behavior are normal. Patient exhibits appropriate insight and judgement.   ____________________________________________   LABS (all labs ordered are listed, but only abnormal results are displayed)  Labs Reviewed - No data to display ____________________________________________  EKG   ____________________________________________  RADIOLOGY Festus BarrenI, Lattie Cervi D Citlaly Camplin, personally viewed and evaluated these images (plain radiographs) as part of my medical decision making, as well as reviewing the written report by the radiologist.  Dg Knee Complete 4 Views Left  Result Date: 02/16/2016 CLINICAL DATA:  Fall EXAM: LEFT KNEE - COMPLETE 4+ VIEW COMPARISON:  None. FINDINGS: No acute fracture. No dislocation.  Unremarkable soft tissues. IMPRESSION: No acute bony pathology Electronically Signed   By: Jolaine ClickArthur  Hoss M.D.   On: 02/16/2016 17:54    ____________________________________________    PROCEDURES  Procedure(s) performed:    Procedures    Medications - No data to display   ____________________________________________   INITIAL IMPRESSION / ASSESSMENT AND PLAN / ED COURSE  Pertinent labs & imaging results that were available during my  care of the patient were reviewed by me and considered in my medical decision making (see chart for details).  Review of the [place] CSRS was performed in accordance of the [facility] prior to dispensing any controlled drugs.  Clinical Course    Patient's diagnosis is consistent with Left knee sprain. X-rays reveal no acute osseous abdomen benign. Special tests are negative. Patient's diagnosis is most consistent with left knee sprain.. Patient will be discharged home with prescriptions for anti-inflammatories for symptom control.. Patient is to follow up with primary care as needed or otherwise directed. Patient is given ED precautions to return to the ED for any worsening or new symptoms.     ____________________________________________  FINAL CLINICAL IMPRESSION(S) / ED DIAGNOSES  Final diagnoses:  Left knee sprain, initial encounter      NEW MEDICATIONS STARTED DURING THIS VISIT:  New Prescriptions   MELOXICAM (MOBIC) 15 MG TABLET    Take 1 tablet (15 mg total) by mouth daily.        This chart was dictated using voice recognition software/Dragon. Despite best efforts to proofread, errors can occur which can change the meaning. Any  change was purely unintentional.    Racheal Patches, PA-C 02/16/16 1809    Phineas Semen, MD 02/16/16 2019

## 2016-03-02 ENCOUNTER — Ambulatory Visit: Payer: Medicaid Other | Admitting: Advanced Practice Midwife

## 2016-05-01 IMAGING — CR DG ANKLE COMPLETE 3+V*R*
3 series · 3 of 3 positions shown · non-contrast
Comparison: None.

CLINICAL DATA: Right ankle pain following injury

EXAM:
RIGHT ANKLE - COMPLETE 3+ VIEW

[view not recorded (1 of 3)]
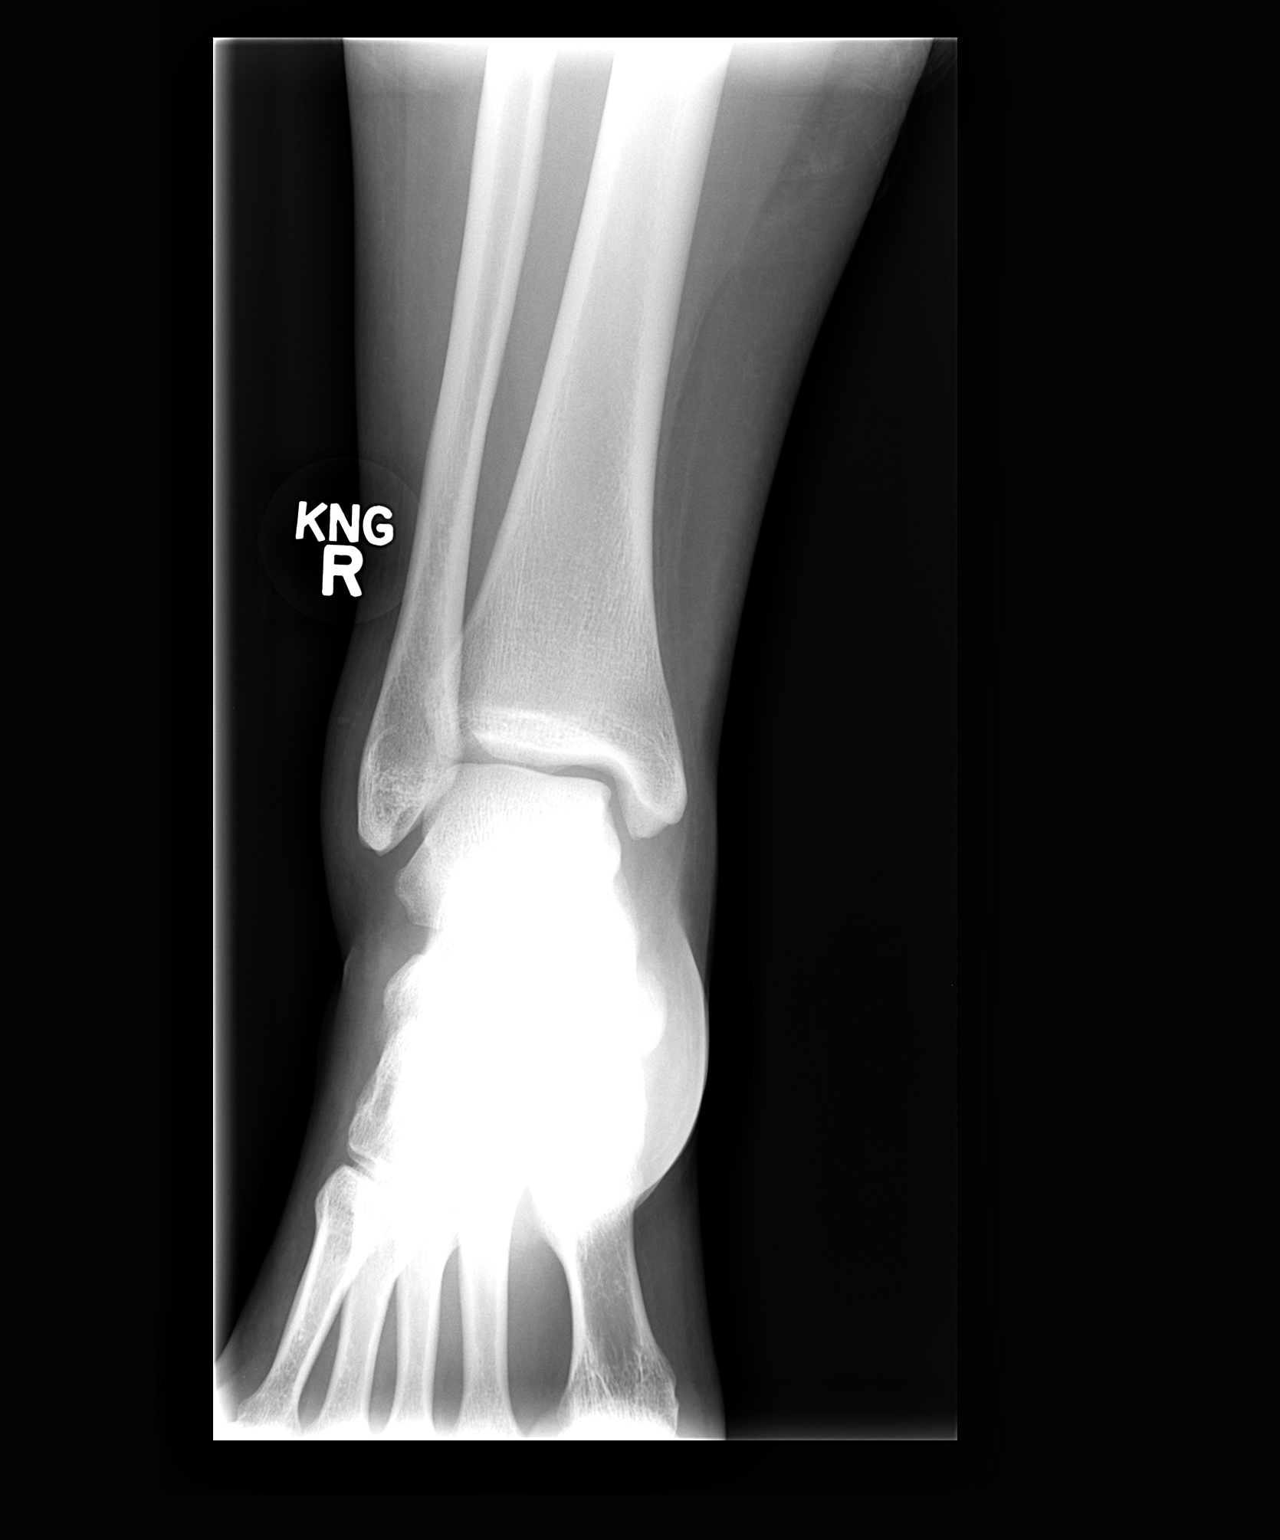

[view not recorded (2 of 3)]
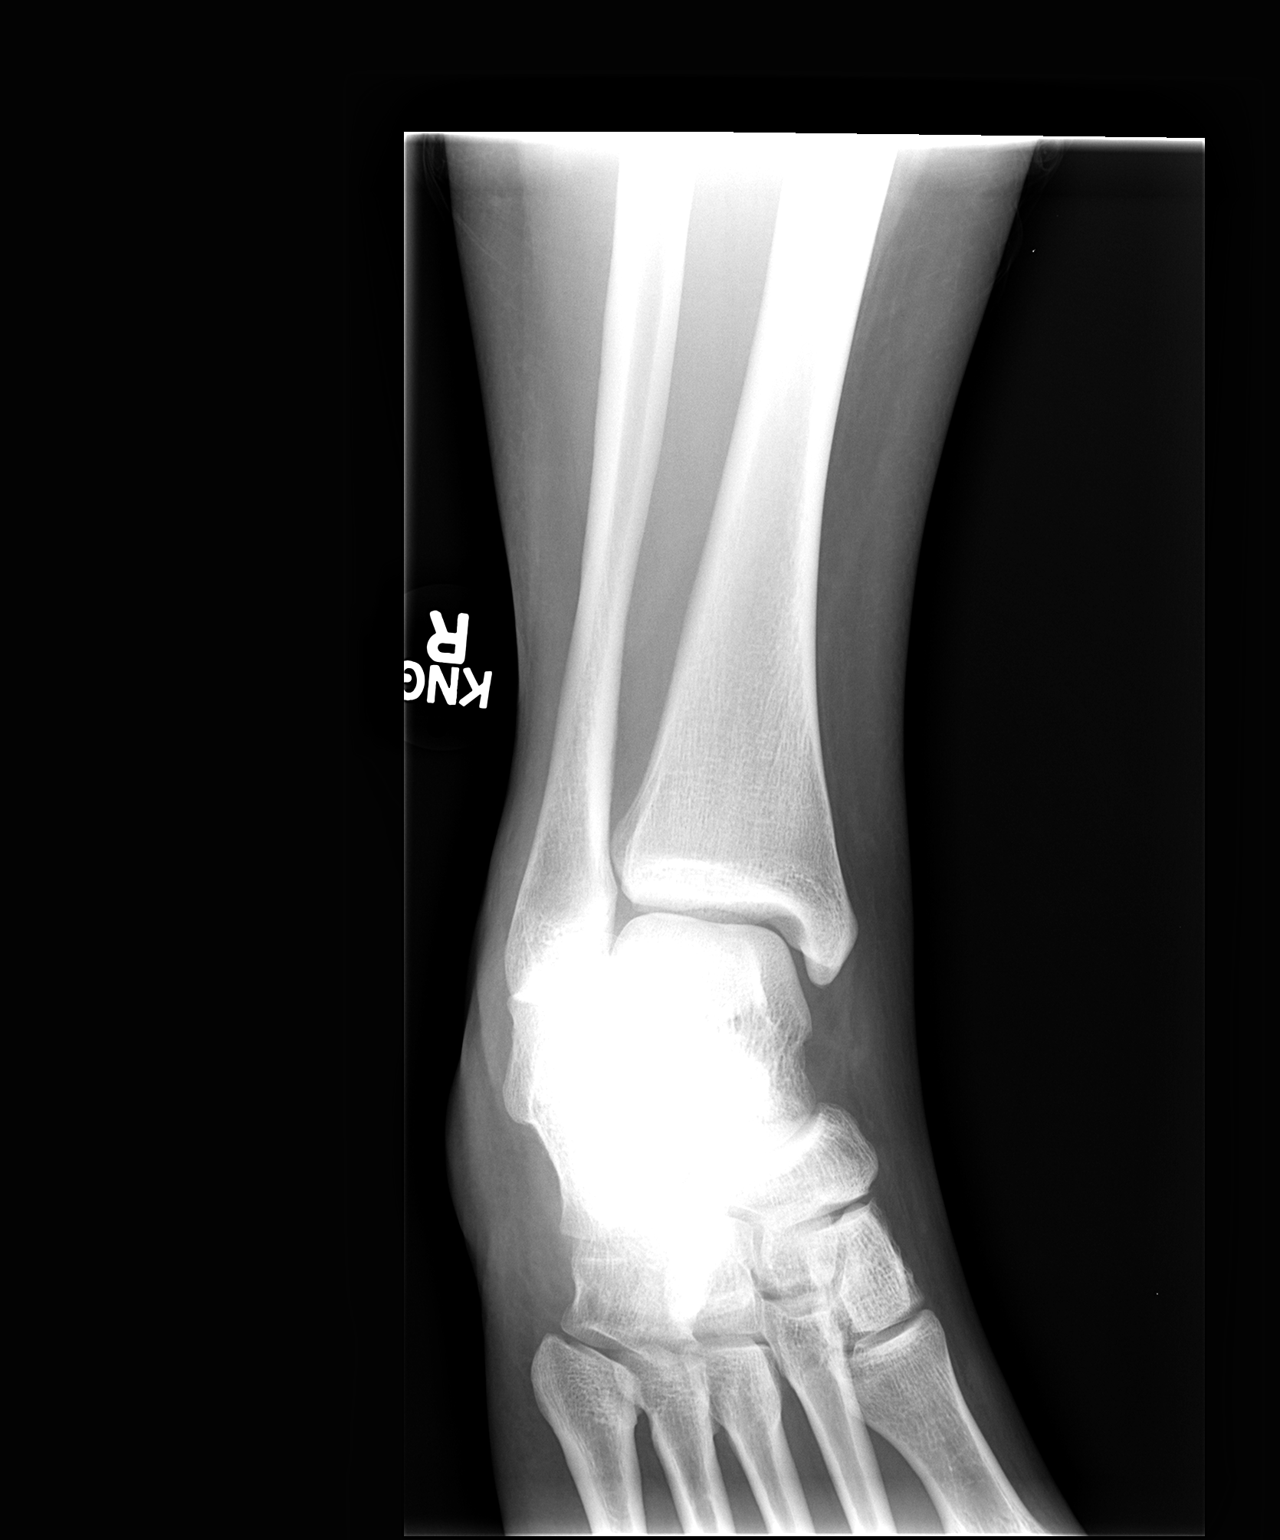

[view not recorded (3 of 3)]
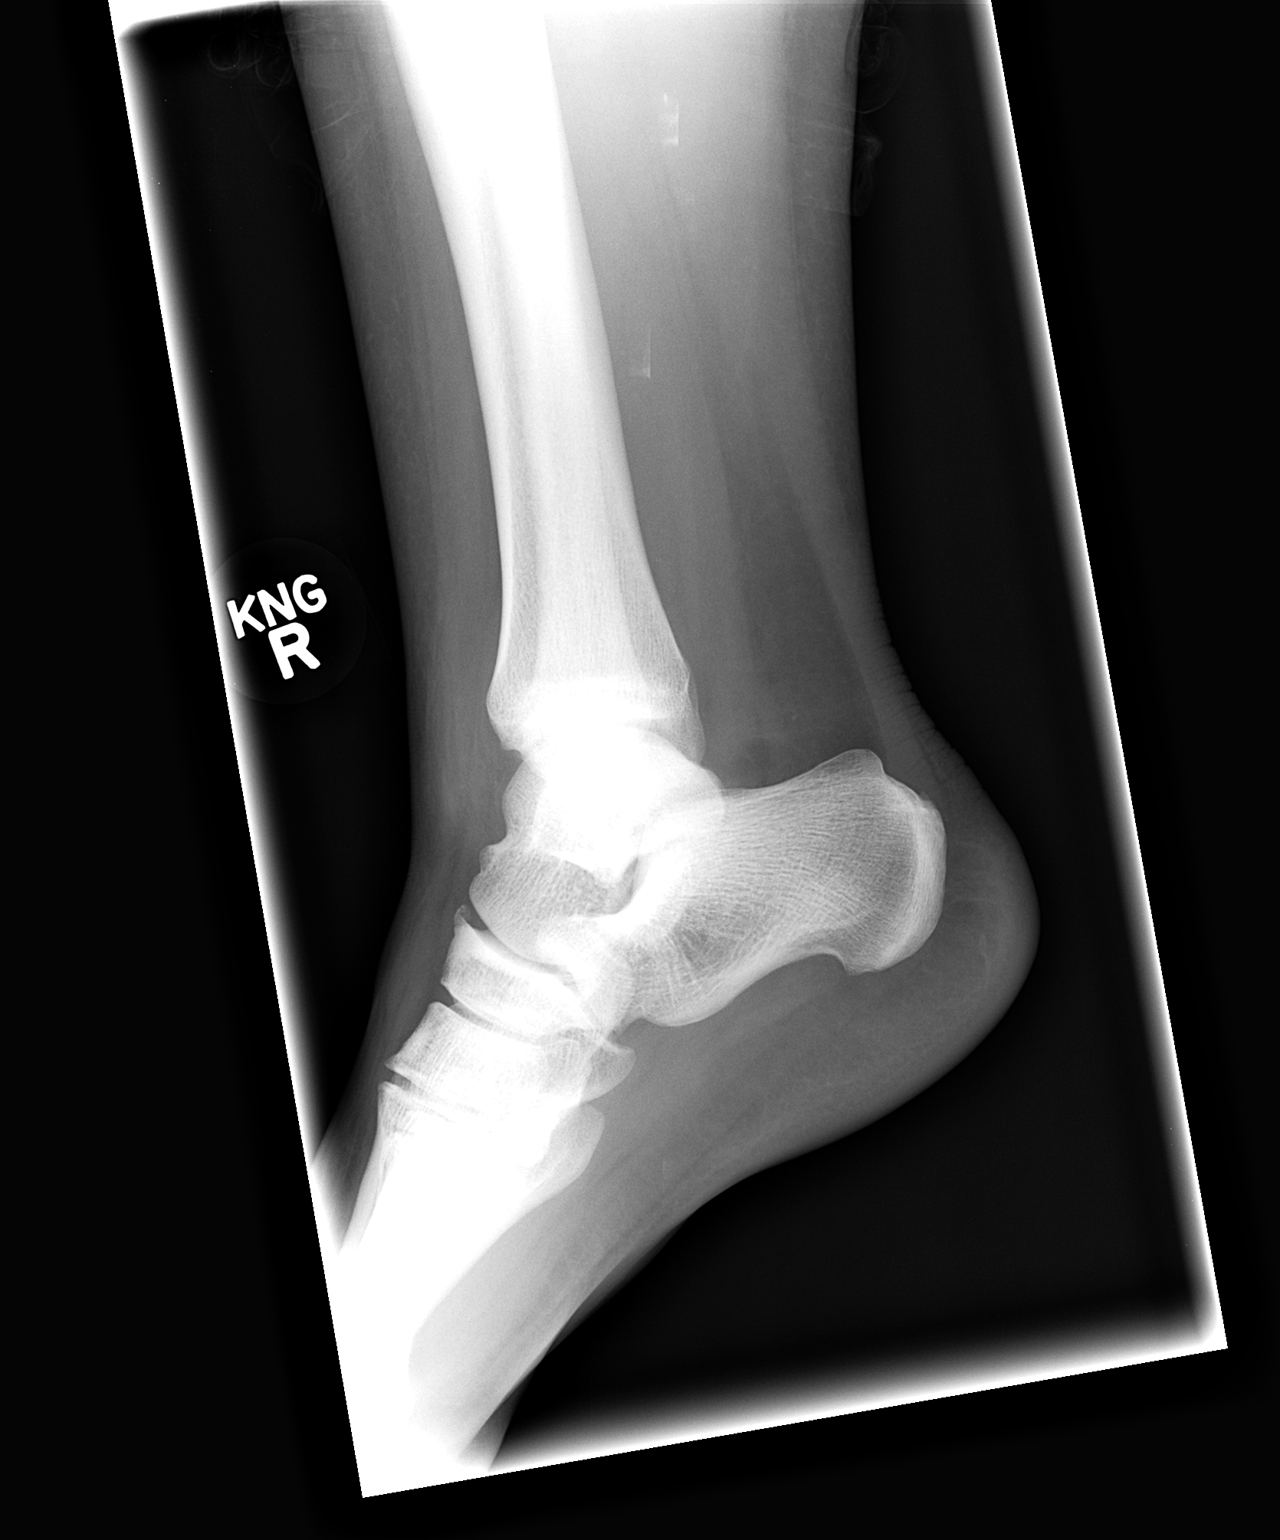

[3 of 3 positions shown; findings below may reference images not displayed]

FINDINGS: Soft tissue swelling is noted laterally. No acute fracture or
dislocation is noted.
IMPRESSION: Soft tissue swelling without bony abnormality.

## 2016-12-15 IMAGING — US US OB FOLLOW-UP
1 series · 12 of 28 positions shown · non-contrast
Comparison: none

[Series 1: us ob follow up · 12 of 47 slices shown]
[im 2/47]
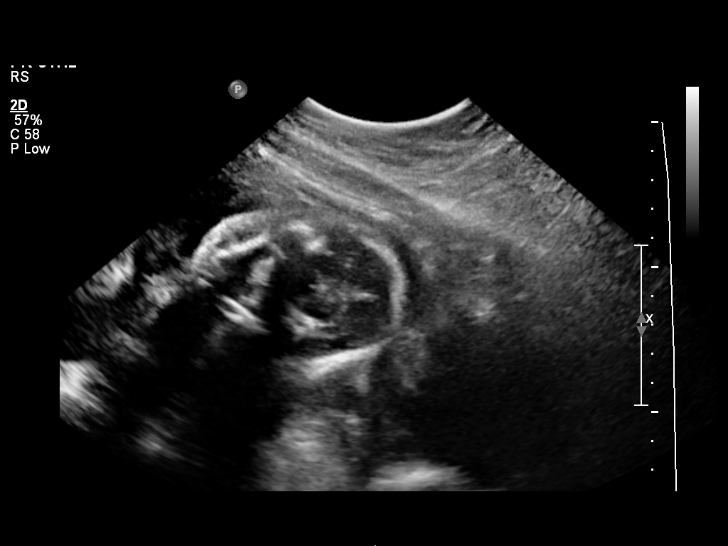
[im 6/47]
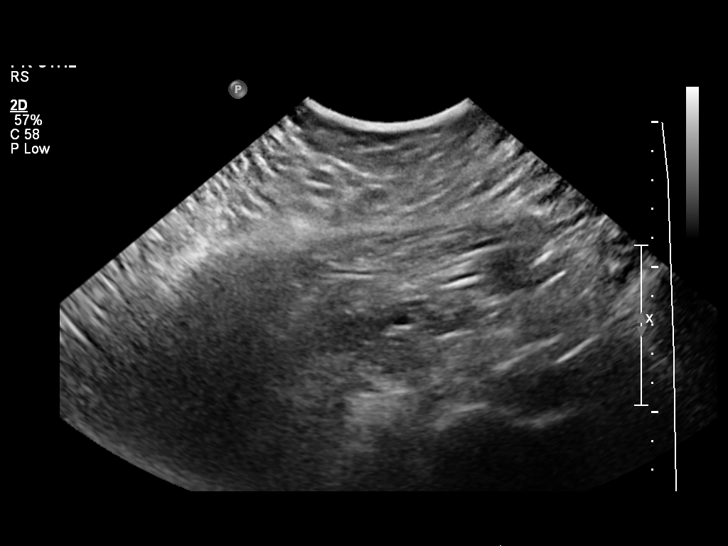
[im 9/47]
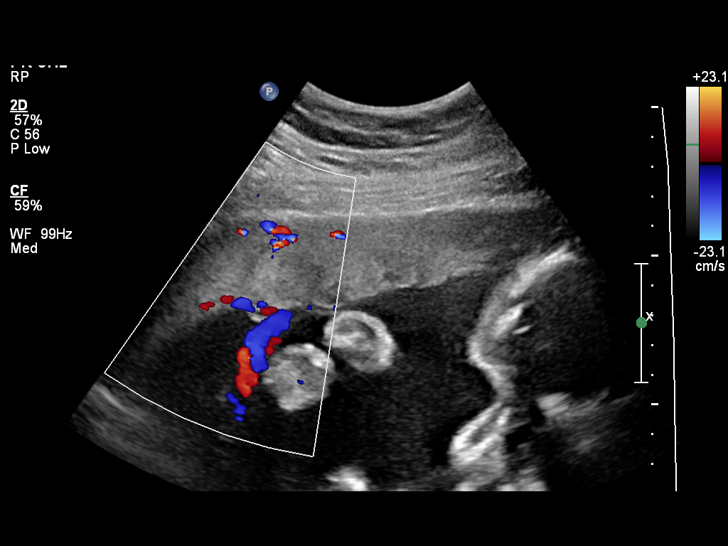
[im 14/47]
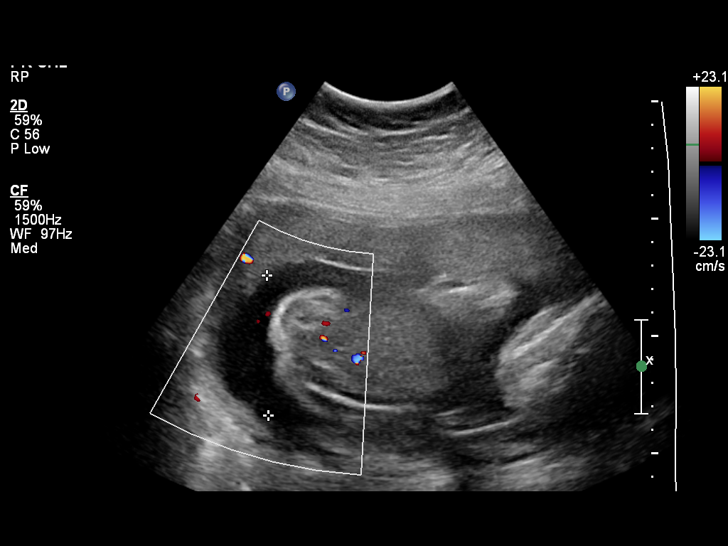
[im 18/47]
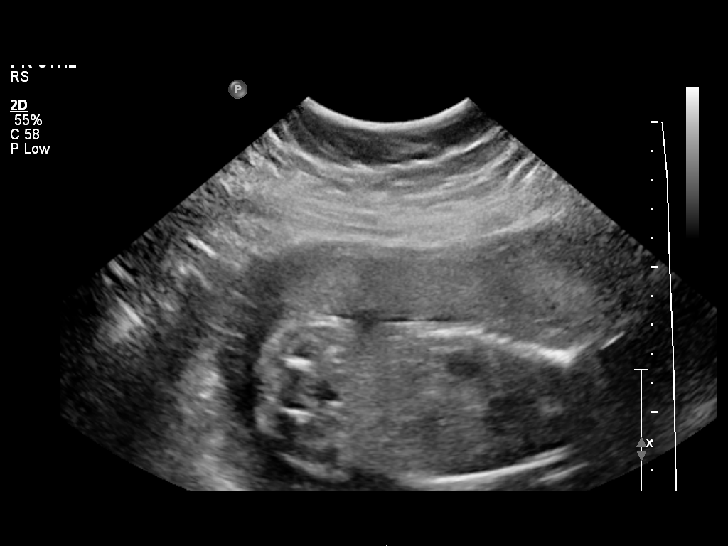
[im 21/47]
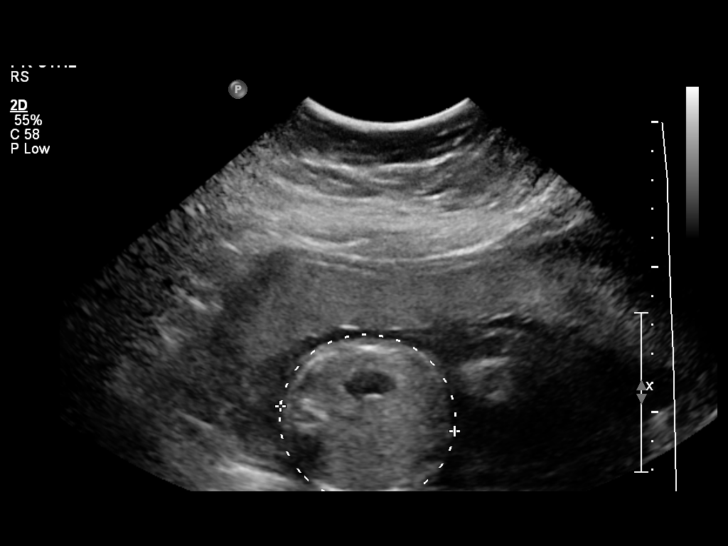
[im 26/47]
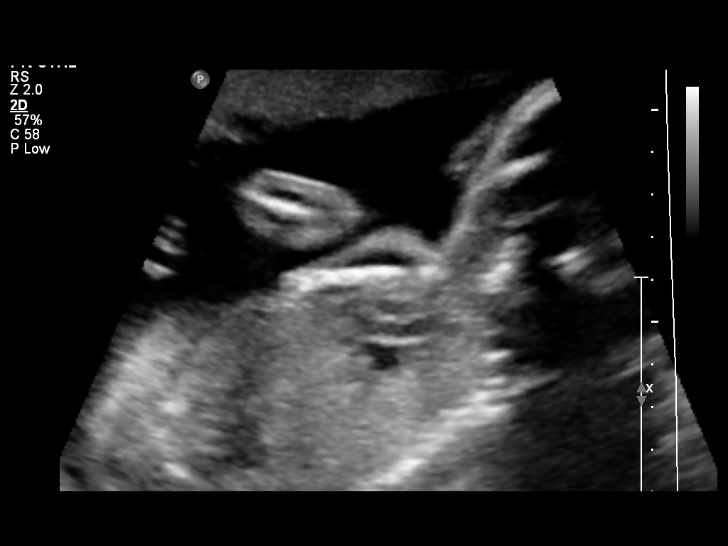
[im 29/47]
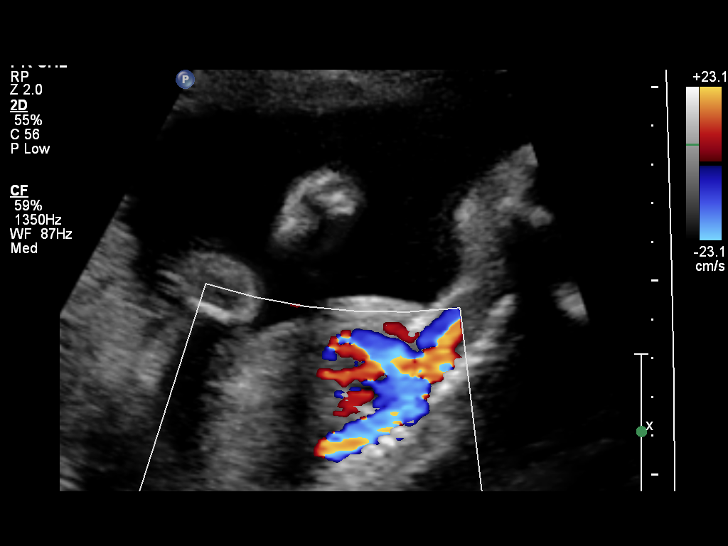
[im 33/47]
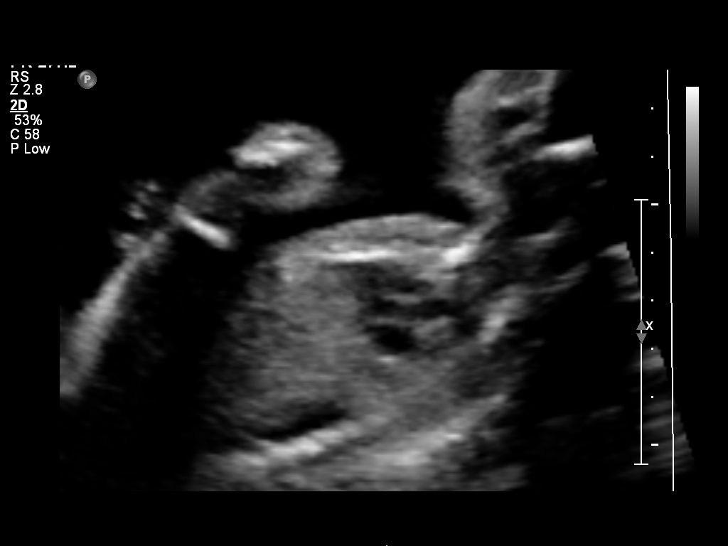
[im 38/47]
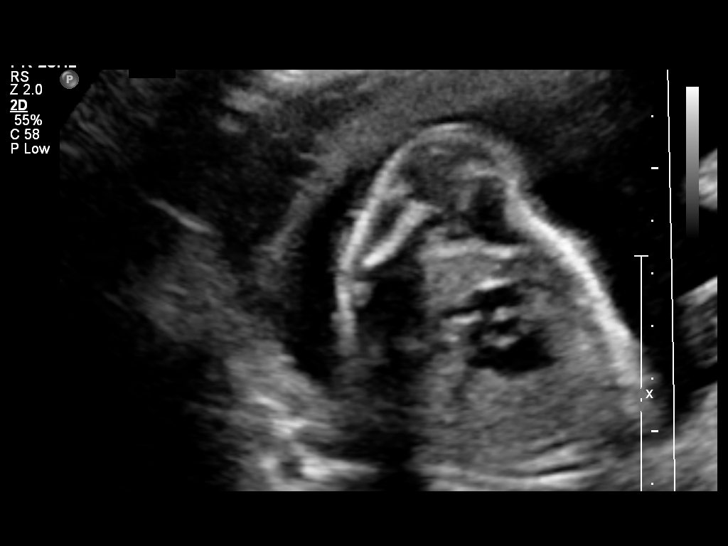
[im 41/47]
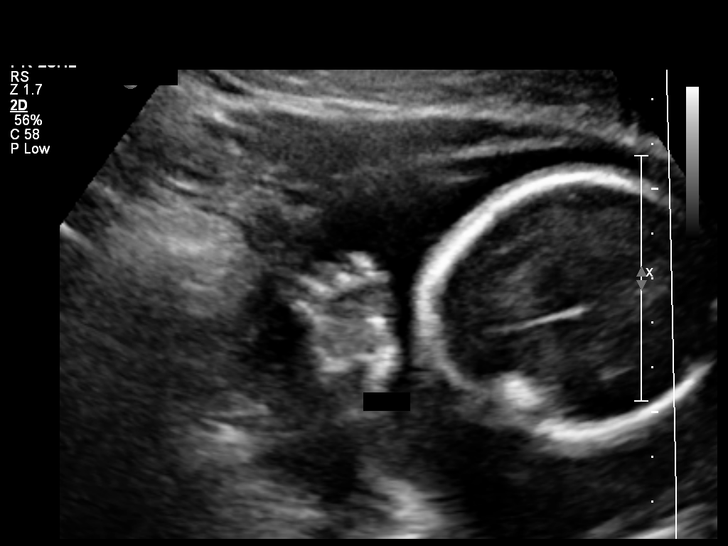
[im 45/47]
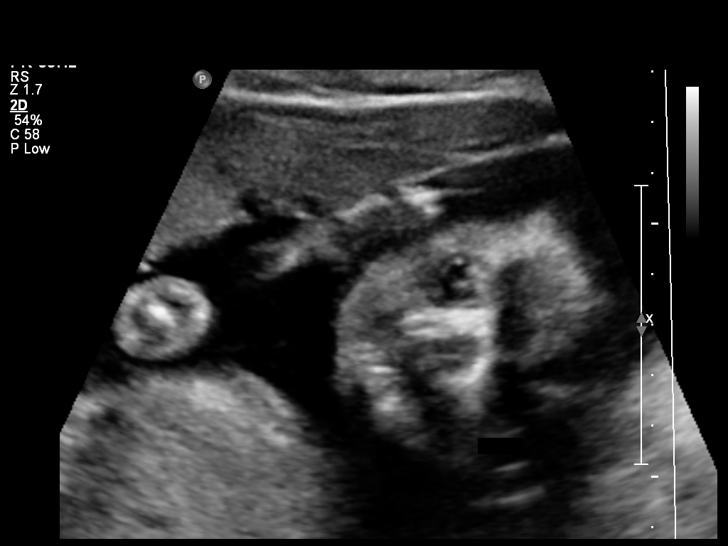

[12 of 28 positions shown; findings below may reference images not displayed]

OBSTETRICS REPORT
                      (Signed Final 07/14/2014 [DATE])

Service(s) Provided

 US OB FOLLOW UP                                       76816.1
Indications

 23 weeks gestation of pregnancy
 Obesity complicating pregnancy, second trimester
 Asthma                                                V99.J9 [id number]
 Rh negative
 Follow-up incomplete fetal anatomic evaluation        Z36
Fetal Evaluation

 Num Of Fetuses:    1
 Fetal Heart Rate:  156                          bpm
 Cardiac Activity:  Observed
 Presentation:      Cephalic
 Placenta:          Anterior, above cervical os
 P. Cord            Visualized
 Insertion:

 Amniotic Fluid
 AFI FV:      Subjectively within normal limits
                                             Larg Pckt:     6.0  cm
Biometry

 BPD:     57.1  mm     G. Age:  23w 3d                CI:        76.59   70 - 86
                                                      FL/HC:      22.0   19.2 -

 HC:     206.7  mm     G. Age:  22w 5d       14  %    HC/AC:      1.09   1.05 -

 AC:     188.8  mm     G. Age:  23w 5d       48  %    FL/BPD:     79.7   71 - 87
 FL:      45.5  mm     G. Age:  25w 1d       85  %    FL/AC:      24.1   20 - 24

 Est. FW:     658  gm      1 lb 7 oz     63  %
Gestational Age

 LMP:           23w 3d        Date:  01/31/14                 EDD:   11/07/14
 U/S Today:     23w 5d                                        EDD:   11/05/14
 Best:          23w 3d     Det. By:  LMP  (01/31/14)          EDD:   11/07/14
Anatomy

 Cranium:          Appears normal         Aortic Arch:      Not well visualized
 Fetal Cavum:      Appears normal         Ductal Arch:      Not well visualized
 Ventricles:       Not well visualized    Diaphragm:        Appears normal
 Choroid Plexus:   Previously seen        Stomach:          Appears normal, left
                                                            sided
 Cerebellum:       Previously seen        Abdomen:          Appears normal
 Posterior Fossa:  Previously seen        Abdominal Wall:   Previously seen
 Nuchal Fold:      Previously seen        Cord Vessels:     Appears normal (3
                                                            vessel cord)
 Face:             Orbits and profile     Kidneys:          Not well visualized
                   previously seen
 Lips:             Appears normal         Bladder:          Appears normal
 Heart:            Not well visualized    Spine:            Previously seen
 RVOT:             Appears normal         Lower             Previously seen
                                          Extremities:
 LVOT:             Not well visualized    Upper             Previously seen
                                          Extremities:

 Other:  Fetus appears to be a female. Heels and 5th digit,Nasal bone prev.
         visualized. Technically difficult due to maternal habitus and fetal
         position.
Cervix Uterus Adnexa

 Cervical Length:    3.8      cm

 Cervix:       Normal appearance by transabdominal scan.

 Left Ovary:    Not visualized. No adnexal mass visualized.
 Right Ovary:   Size(cm) L: 2.04 x W: 1.87 x H: 1.76  Volume(cc):
Impression

 SIUP at 23+3 weeks
 Normal interval anatomy; limited views of heart
 Normal amniotic fluid volume
 Appropriate interval growth with EFW at the 63rd %tile
Recommendations

 Follow-up ultrasound in 6-8 weeks to complete anatomy
 survey and to assess growth

 questions or concerns.

## 2018-01-29 ENCOUNTER — Emergency Department
Admission: EM | Admit: 2018-01-29 | Discharge: 2018-01-29 | Disposition: A | Discharge status: Home / Self Care | Payer: Medicaid Other | Attending: Emergency Medicine | Admitting: Emergency Medicine

## 2018-01-29 ENCOUNTER — Encounter: Payer: Self-pay | Admitting: Emergency Medicine

## 2018-01-29 ENCOUNTER — Other Ambulatory Visit: Payer: Self-pay

## 2018-01-29 DIAGNOSIS — R1030 Lower abdominal pain, unspecified: Secondary | ICD-10-CM | POA: Diagnosis not present

## 2018-01-29 DIAGNOSIS — R197 Diarrhea, unspecified: Secondary | ICD-10-CM | POA: Insufficient documentation

## 2018-01-29 DIAGNOSIS — J4520 Mild intermittent asthma, uncomplicated: Secondary | ICD-10-CM | POA: Diagnosis not present

## 2018-01-29 DIAGNOSIS — F17210 Nicotine dependence, cigarettes, uncomplicated: Secondary | ICD-10-CM | POA: Insufficient documentation

## 2018-01-29 LAB — LIPASE, BLOOD: LIPASE: 36 U/L (ref 11–51)

## 2018-01-29 LAB — GASTROINTESTINAL PANEL BY PCR, STOOL (REPLACES STOOL CULTURE)
Adenovirus F40/41: NOT DETECTED
Astrovirus: NOT DETECTED
CRYPTOSPORIDIUM: NOT DETECTED
Campylobacter species: NOT DETECTED
Cyclospora cayetanensis: NOT DETECTED
ENTAMOEBA HISTOLYTICA: NOT DETECTED
ENTEROAGGREGATIVE E COLI (EAEC): NOT DETECTED
Enteropathogenic E coli (EPEC): NOT DETECTED
Enterotoxigenic E coli (ETEC): NOT DETECTED
GIARDIA LAMBLIA: NOT DETECTED
NOROVIRUS GI/GII: NOT DETECTED
Plesimonas shigelloides: NOT DETECTED
ROTAVIRUS A: NOT DETECTED
SAPOVIRUS (I, II, IV, AND V): NOT DETECTED
SHIGA LIKE TOXIN PRODUCING E COLI (STEC): NOT DETECTED
Salmonella species: NOT DETECTED
Shigella/Enteroinvasive E coli (EIEC): NOT DETECTED
VIBRIO CHOLERAE: NOT DETECTED
Vibrio species: NOT DETECTED
YERSINIA ENTEROCOLITICA: NOT DETECTED

## 2018-01-29 LAB — URINALYSIS, COMPLETE (UACMP) WITH MICROSCOPIC
Bilirubin Urine: NEGATIVE
GLUCOSE, UA: NEGATIVE mg/dL
KETONES UR: NEGATIVE mg/dL
Nitrite: NEGATIVE
PH: 6 (ref 5.0–8.0)
Protein, ur: NEGATIVE mg/dL
Specific Gravity, Urine: 1.013 (ref 1.005–1.030)

## 2018-01-29 LAB — CBC
HCT: 38.3 % (ref 35.0–47.0)
Hemoglobin: 13.1 g/dL (ref 12.0–16.0)
MCH: 31 pg (ref 26.0–34.0)
MCHC: 34.3 g/dL (ref 32.0–36.0)
MCV: 90.3 fL (ref 80.0–100.0)
PLATELETS: 322 10*3/uL (ref 150–440)
RBC: 4.25 MIL/uL (ref 3.80–5.20)
RDW: 14.1 % (ref 11.5–14.5)
WBC: 8 10*3/uL (ref 3.6–11.0)

## 2018-01-29 LAB — COMPREHENSIVE METABOLIC PANEL
ALBUMIN: 3.8 g/dL (ref 3.5–5.0)
ALK PHOS: 93 U/L (ref 38–126)
ALT: 18 U/L (ref 0–44)
AST: 20 U/L (ref 15–41)
Anion gap: 5 (ref 5–15)
BUN: 11 mg/dL (ref 6–20)
CALCIUM: 8.5 mg/dL — AB (ref 8.9–10.3)
CHLORIDE: 108 mmol/L (ref 98–111)
CO2: 24 mmol/L (ref 22–32)
CREATININE: 0.69 mg/dL (ref 0.44–1.00)
GFR calc Af Amer: >60 mL/min (ref >60)
GFR calc non Af Amer: >60 mL/min (ref >60)
GLUCOSE: 107 mg/dL — AB (ref 70–99)
Potassium: 3.8 mmol/L (ref 3.5–5.1)
SODIUM: 137 mmol/L (ref 135–145)
Total Bilirubin: 0.4 mg/dL (ref 0.3–1.2)
Total Protein: 6.8 g/dL (ref 6.5–8.1)

## 2018-01-29 LAB — C DIFFICILE QUICK SCREEN W PCR REFLEX
C DIFFICILE (CDIFF) TOXIN: NEGATIVE
C DIFFICLE (CDIFF) ANTIGEN: NEGATIVE
C Diff interpretation: NOT DETECTED

## 2018-01-29 NOTE — ED Triage Notes (Signed)
Pt states abd pain and diarrhea that began this am. Appears in NAD.

## 2018-01-29 NOTE — ED Notes (Signed)
Pt requesting discharge because she is not able to provide stool specimen. ED provider made aware.

## 2018-01-29 NOTE — ED Notes (Signed)
Patient ambulatory to Rm 2, Vernona RiegerLaura RN aware of room placement.  Patient given gown to put on, stretcher in low position.

## 2018-01-29 NOTE — ED Provider Notes (Signed)
[facility] Emergency Department Provider Note   ____________________________________________   First MD Initiated Contact with Patient 01/29/18 1007     (approximate)  I have reviewed the triage vital signs and the nursing notes.   HISTORY  Chief Complaint Abdominal Pain and Diarrhea   HPI Nicole Dillon is a 22 y.o. female patient reports diarrhea with frequent watery stools onset this morning.  Before she has the diarrhea she gets bad pain in her belly.  This is mostly in the lower abdomen.  At present she has a little bit of pain in the mid lower abdomen while she does not describe it is crampy it that is what it sounds like.  She has not had this before.   Past Medical History:  Diagnosis Date  . 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activity   . Asthma   . Insomnia   . Irregular heart beat   . Seasonal allergies     Patient Active Problem List   Diagnosis Date Noted  . Tachycardia 12/10/2015  . History of chlamydia 11/18/2015  . Asthma, mild intermittent 05/31/2015  . Other allergic rhinitis 05/31/2015  . BMI 40.0-44.9, adult (HCC) 06/18/2014  . Blood type, Rh negative 04/08/2014    Past Surgical History:  Procedure Laterality Date  . TOOTH EXTRACTION      Prior to Admission medications   Medication Sig Start Date End Date Taking? Authorizing Provider  cetirizine (ZYRTEC ALLERGY) 10 MG tablet Take 1 tablet (10 mg total) by mouth daily. Patient not taking: Reported on 01/27/2016 05/31/15   Reva BoresPratt, Tanya S, MD  meloxicam (MOBIC) 15 MG tablet Take 1 tablet (15 mg total) by mouth daily. Patient not taking: Reported on 01/29/2018 02/16/16   Cuthriell, Delorise RoyalsJonathan D, PA-C    Allergies Patient has no known allergies.  Family History  Problem Relation Age of Onset  . Diabetes Mother   . Diabetes Father   . Kidney disease Father        dialysis  . Learning disabilities Maternal Uncle        speach impediment  . Heart disease  Maternal Grandmother   . Heart disease Maternal Grandfather     Social History Social History   Tobacco Use  . Smoking status: Light Tobacco Smoker    Last attempt to quit: 03/10/2014    Years since quitting: 3.8  . Smokeless tobacco: Never Used  Substance Use Topics  . Alcohol use: No  . Drug use: No    Review of Systems  Constitutional: No fever/chills Eyes: No visual changes. ENT: No sore throat. Cardiovascular: Denies chest pain. Respiratory: Denies shortness of breath. Gastrointestinal:  abdominal pain.  No nausea, no vomiting.   diarrhea.  No constipation. Genitourinary: Negative for dysuria. Musculoskeletal: Negative for back pain. Skin: Negative for rash. Neurological: Negative for headaches, focal weakness   ____________________________________________   PHYSICAL EXAM:  VITAL SIGNS: ED Triage Vitals  Enc Vitals Group     BP 01/29/18 0843 116/69     Pulse Rate 01/29/18 0842 64     Resp 01/29/18 0842 16     Temp 01/29/18 0842 98.4 F (36.9 C)     Temp Source 01/29/18 0842 Oral     SpO2 01/29/18 0842 99 %     Weight 01/29/18 0843 (!) 390 lb (176.9 kg)     Height 01/29/18 0843 5\' 7"  (1.702 m)     Head Circumference --      Peak Flow --  Pain Score 01/29/18 0843 3     Pain Loc --      Pain Edu? --      Excl. in GC? --     Constitutional: Alert and oriented. Well appearing and in no acute distress!  Appears to be very interested in a television Eyes: Conjunctivae are normal. PER. EOMI. Head: Atraumatic. Nose: No congestion/rhinnorhea. Mouth/Throat: Mucous membranes are moist.  Oropharynx non-erythematous. Neck: No stridor.  Cardiovascular: Normal rate, regular rhythm. Grossly normal heart sounds.  Good peripheral circulation. Respiratory: Normal respiratory effort.  No retractions. Lungs CTAB. Gastrointestinal: Soft tender to very deep palpation in the lower abdomen not otherwise tender. No distention. No abdominal bruits. No CVA  tenderness. Musculoskeletal: No lower extremity tenderness nor edema.  No joint effusions. Neurologic:  Normal speech and language. No gross focal neurologic deficits are appreciated.  Skin:  Skin is warm, dry and intact. No rash noted. Psychiatric: Mood and affect are normal. Speech and behavior are normal.  ____________________________________________   LABS (all labs ordered are listed, but only abnormal results are displayed)  Labs Reviewed  COMPREHENSIVE METABOLIC PANEL - Abnormal; Notable for the following components:      Result Value   Glucose, Bld 107 (*)    Calcium 8.5 (*)    All other components within normal limits  URINALYSIS, COMPLETE (UACMP) WITH MICROSCOPIC - Abnormal; Notable for the following components:   Color, Urine YELLOW (*)    APPearance CLOUDY (*)    Hgb urine dipstick SMALL (*)    Leukocytes, UA TRACE (*)    Bacteria, UA RARE (*)    All other components within normal limits  GASTROINTESTINAL PANEL BY PCR, STOOL (REPLACES STOOL CULTURE)  C DIFFICILE QUICK SCREEN W PCR REFLEX  LIPASE, BLOOD  CBC  POC URINE PREG, ED   ____________________________________________  EKG   ____________________________________________  RADIOLOGY  ED MD interpretation:    Official radiology report(s): No results found.  ____________________________________________   PROCEDURES  Procedure(s) performed:   Procedures  Critical Care performed:   ____________________________________________   INITIAL IMPRESSION / ASSESSMENT AND PLAN / ED COURSE  ----------------------------------------- 12:01 PM on 01/29/2018 -----------------------------------------  At this time patient reports some continued crampy abdominal pain but no pain on palpation or percussion of the abdomen.  She has not been able to have a stool yet.  Is asking to go home I think this will be safe.  She will return if she is worse if the diarrhea continues if she gets a fever vomiting  etc.         ____________________________________________   FINAL CLINICAL IMPRESSION(S) / ED DIAGNOSES  Final diagnoses:  Diarrhea, unspecified type     ED Discharge Orders    None       Note:  This document was prepared using Dragon voice recognition software and may include unintentional dictation errors.    Arnaldo NatalMalinda, Deshan Hemmelgarn F, MD 01/29/18 [id number]

## 2018-01-29 NOTE — Discharge Instructions (Addendum)
Please try Pepto-Bismol 1 or 2 doses.  Please return here for worse pain, fever, vomiting or if the diarrhea continues tomorrow.  Drink fluids in small amounts frequently when you get home and start eating by starting with the brat diet: bananas, rice, applesauce, toast & crackers.  If you tolerate this then eat regular food after 3 or 4 hours.

## 2018-03-18 ENCOUNTER — Other Ambulatory Visit: Payer: Self-pay | Admitting: Family Medicine

## 2018-03-18 DIAGNOSIS — R109 Unspecified abdominal pain: Secondary | ICD-10-CM

## 2018-03-21 ENCOUNTER — Ambulatory Visit: Payer: Medicaid Other

## 2018-03-27 ENCOUNTER — Ambulatory Visit: Payer: Medicaid Other | Attending: Family Medicine

## 2020-02-12 ENCOUNTER — Other Ambulatory Visit: Payer: Self-pay

## 2020-02-12 ENCOUNTER — Emergency Department (HOSPITAL_COMMUNITY)
Admission: EM | Admit: 2020-02-12 | Discharge: 2020-02-12 | Disposition: A | Discharge status: Home / Self Care | Payer: Self-pay | Attending: Emergency Medicine | Admitting: Emergency Medicine

## 2020-02-12 ENCOUNTER — Encounter (HOSPITAL_COMMUNITY): Payer: Self-pay | Admitting: Emergency Medicine

## 2020-02-12 ENCOUNTER — Emergency Department (HOSPITAL_COMMUNITY): Payer: Self-pay

## 2020-02-12 DIAGNOSIS — Z79899 Other long term (current) drug therapy: Secondary | ICD-10-CM | POA: Insufficient documentation

## 2020-02-12 DIAGNOSIS — Y999 Unspecified external cause status: Secondary | ICD-10-CM | POA: Insufficient documentation

## 2020-02-12 DIAGNOSIS — S81811A Laceration without foreign body, right lower leg, initial encounter: Secondary | ICD-10-CM | POA: Insufficient documentation

## 2020-02-12 DIAGNOSIS — Y939 Activity, unspecified: Secondary | ICD-10-CM | POA: Insufficient documentation

## 2020-02-12 DIAGNOSIS — J45909 Unspecified asthma, uncomplicated: Secondary | ICD-10-CM | POA: Insufficient documentation

## 2020-02-12 DIAGNOSIS — Z23 Encounter for immunization: Secondary | ICD-10-CM | POA: Insufficient documentation

## 2020-02-12 DIAGNOSIS — Y929 Unspecified place or not applicable: Secondary | ICD-10-CM | POA: Insufficient documentation

## 2020-02-12 DIAGNOSIS — W540XXA Bitten by dog, initial encounter: Secondary | ICD-10-CM | POA: Insufficient documentation

## 2020-02-12 DIAGNOSIS — I471 Supraventricular tachycardia: Secondary | ICD-10-CM | POA: Insufficient documentation

## 2020-02-12 DIAGNOSIS — F17210 Nicotine dependence, cigarettes, uncomplicated: Secondary | ICD-10-CM | POA: Insufficient documentation

## 2020-02-12 LAB — CBC WITH DIFFERENTIAL/PLATELET
Abs Immature Granulocytes: 0.05 10*3/uL (ref 0.00–0.07)
Basophils Absolute: 0 10*3/uL (ref 0.0–0.1)
Basophils Relative: 0 %
Eosinophils Absolute: 0.1 10*3/uL (ref 0.0–0.5)
Eosinophils Relative: 1 %
HCT: 42.4 % (ref 36.0–46.0)
Hemoglobin: 13.4 g/dL (ref 12.0–15.0)
Immature Granulocytes: 1 %
Lymphocytes Relative: 19 %
Lymphs Abs: 2 10*3/uL (ref 0.7–4.0)
MCH: 29.3 pg (ref 26.0–34.0)
MCHC: 31.6 g/dL (ref 30.0–36.0)
MCV: 92.6 fL (ref 80.0–100.0)
Monocytes Absolute: 0.6 10*3/uL (ref 0.1–1.0)
Monocytes Relative: 6 %
Neutro Abs: 7.9 10*3/uL — ABNORMAL HIGH (ref 1.7–7.7)
Neutrophils Relative %: 73 %
Platelets: 374 10*3/uL (ref 150–400)
RBC: 4.58 MIL/uL (ref 3.87–5.11)
RDW: 13.5 % (ref 11.5–15.5)
WBC: 10.7 10*3/uL — ABNORMAL HIGH (ref 4.0–10.5)
nRBC: 0 % (ref 0.0–0.2)

## 2020-02-12 LAB — COMPREHENSIVE METABOLIC PANEL
ALT: 19 U/L (ref 0–44)
AST: 20 U/L (ref 15–41)
Albumin: 3.6 g/dL (ref 3.5–5.0)
Alkaline Phosphatase: 94 U/L (ref 38–126)
Anion gap: 7 (ref 5–15)
BUN: 9 mg/dL (ref 6–20)
CO2: 26 mmol/L (ref 22–32)
Calcium: 9.1 mg/dL (ref 8.9–10.3)
Chloride: 108 mmol/L (ref 98–111)
Creatinine, Ser: 0.83 mg/dL (ref 0.44–1.00)
GFR calc Af Amer: >60 mL/min (ref >60)
GFR calc non Af Amer: >60 mL/min (ref >60)
Glucose, Bld: 110 mg/dL — ABNORMAL HIGH (ref 70–99)
Potassium: 4 mmol/L (ref 3.5–5.1)
Sodium: 141 mmol/L (ref 135–145)
Total Bilirubin: 0.3 mg/dL (ref 0.3–1.2)
Total Protein: 6.8 g/dL (ref 6.5–8.1)

## 2020-02-12 MED ORDER — METOPROLOL TARTRATE 25 MG PO TABS: 50.0000 mg | ORAL_TABLET | Freq: Two times a day (BID) | ORAL | 0 refills | Status: DC

## 2020-02-12 MED ORDER — METOPROLOL TARTRATE 5 MG/5ML IV SOLN
5.0000 mg | Freq: Once | INTRAVENOUS | Status: AC
  Administered 2020-02-12: 5 mg via INTRAVENOUS
  Filled 2020-02-12: qty 5

## 2020-02-12 MED ORDER — TETANUS-DIPHTH-ACELL PERTUSSIS 5-2.5-18.5 LF-MCG/0.5 IM SUSP
0.5000 mL | Freq: Once | INTRAMUSCULAR | Status: AC
  Administered 2020-02-12: 0.5 mL via INTRAMUSCULAR
  Filled 2020-02-12: qty 0.5

## 2020-02-12 MED ORDER — AMOXICILLIN-POT CLAVULANATE 875-125 MG PO TABS: 1.0000 | ORAL_TABLET | Freq: Two times a day (BID) | ORAL | 0 refills | Status: DC

## 2020-02-12 MED ORDER — SODIUM CHLORIDE 0.9 % IV BOLUS (SEPSIS)
1000.0000 mL | Freq: Once | INTRAVENOUS | Status: AC
  Administered 2020-02-12: 1000 mL via INTRAVENOUS

## 2020-02-12 MED ORDER — METOPROLOL TARTRATE 25 MG PO TABS
25.0000 mg | ORAL_TABLET | Freq: Once | ORAL | Status: AC
  Administered 2020-02-12: 25 mg via ORAL
  Filled 2020-02-12: qty 1

## 2020-02-12 MED ORDER — AMOXICILLIN-POT CLAVULANATE 875-125 MG PO TABS
1.0000 | ORAL_TABLET | Freq: Once | ORAL | Status: AC
  Administered 2020-02-12: 1 via ORAL
  Filled 2020-02-12: qty 1

## 2020-02-12 MED ORDER — LIDOCAINE-EPINEPHRINE 1 %-1:100000 IJ SOLN
10.0000 mL | Freq: Once | INTRAMUSCULAR | Status: AC
  Administered 2020-02-12: 10 mL via INTRADERMAL
  Filled 2020-02-12: qty 1

## 2020-02-12 NOTE — ED Provider Notes (Signed)
  I was asked by Dr. Estell Harpin to perform laceration repair on this patient with a dog bite   .Marland KitchenLaceration Repair  Date/Time: 02/12/2020 8:25 PM Performed by: Mare Ferrari, PA-C Authorized by: Mare Ferrari, PA-C   Consent:    Consent obtained:  Verbal   Consent given by:  Patient   Risks discussed:  Infection, need for additional repair, pain, poor cosmetic result and poor wound healing   Alternatives discussed:  No treatment and delayed treatment Universal protocol:    Procedure explained and questions answered to patient or proxy's satisfaction: yes     Relevant documents present and verified: yes     Test results available and properly labeled: yes     Imaging studies available: yes     Required blood products, implants, devices, and special equipment available: yes     Site/side marked: yes     Immediately prior to procedure, a time out was called: yes     Patient identity confirmed:  Verbally with patient Anesthesia (see MAR for exact dosages):    Anesthesia method:  Local infiltration Laceration details:    Location:  Leg   Leg location:  R lower leg   Length (cm):  4   Depth (mm):  2 Repair type:    Repair type:  Simple Pre-procedure details:    Preparation:  Patient was prepped and draped in usual sterile fashion Exploration:    Hemostasis achieved with:  Direct pressure and epinephrine   Wound exploration: wound explored through full range of motion and entire depth of wound probed and visualized     Wound extent: no fascia violation noted, no foreign bodies/material noted and no muscle damage noted     Contaminated: yes   Treatment:    Area cleansed with:  Betadine and saline   Amount of cleaning:  Extensive   Irrigation solution:  Sterile saline   Irrigation volume:  40cc   Irrigation method:  Syringe   Visualized foreign bodies/material removed: no   Skin repair:    Repair method:  Sutures   Suture size:  3-0   Suture material:  Prolene   Number of  sutures:  2 Approximation:    Approximation:  Loose Post-procedure details:    Dressing:  Open (no dressing)   Patient tolerance of procedure:  Tolerated well, no immediate complications      Leone Brand 02/12/20 2027    Bethann Berkshire, MD 02/16/20 1050

## 2020-02-12 NOTE — ED Triage Notes (Addendum)
Pt presents with dog bite to right lower leg, bleeding controlled, some fatty tissue present. Pt states she "thinks" dog's rabies vaccine is up to date.

## 2020-02-12 NOTE — ED Provider Notes (Signed)
[facility] EMERGENCY DEPARTMENT Provider Note   CSN: [id number] Arrival date & time: 02/12/20  1731     History Chief Complaint  Patient presents with  . Animal Bite    Nicole Dillon is a 24 y.o. female.  Patient states her mother's dog bit her on the right lower leg.  The history is provided by the patient and medical records. No language interpreter was used.  Animal Bite Contact animal:  Dog Animal bite location: Right lower leg. Pain details:    Quality:  Aching   Severity:  Moderate   Timing:  Constant   Progression:  Waxing and waning Incident location:  Home Provoked: unprovoked   Notifications:  None Animal's rabies vaccination status:  Never received Associated symptoms: no rash        Past Medical History:  Diagnosis Date  . 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activity   . Asthma   . Insomnia   . Irregular heart beat   . Seasonal allergies     Patient Active Problem List   Diagnosis Date Noted  . Tachycardia 12/10/2015  . History of chlamydia 11/18/2015  . Asthma, mild intermittent 05/31/2015  . Other allergic rhinitis 05/31/2015  . BMI 40.0-44.9, adult (HCC) 06/18/2014  . Blood type, Rh negative 04/08/2014    Past Surgical History:  Procedure Laterality Date  . TOOTH EXTRACTION       OB History    Gravida  3   Para  2   Term  2   Preterm      AB      Living  2     SAB      TAB      Ectopic      Multiple  0   Live Births  2           Family History  Problem Relation Age of Onset  . Diabetes Mother   . Diabetes Father   . Kidney disease Father        dialysis  . Learning disabilities Maternal Uncle        speach impediment  . Heart disease Maternal Grandmother   . Heart disease Maternal Grandfather     Social History   Tobacco Use  . Smoking status: Light Tobacco Smoker    Last attempt to quit: 03/10/2014    Years since quitting: 5.9  . Smokeless tobacco: Never Used    Substance Use Topics  . Alcohol use: No  . Drug use: No    Home Medications Prior to Admission medications   Medication Sig Start Date End Date Taking? Authorizing Provider  amoxicillin-clavulanate (AUGMENTIN) 875-125 MG tablet Take 1 tablet by mouth every 12 (twelve) hours. 02/12/20   Bethann Berkshire, MD  cetirizine (ZYRTEC ALLERGY) 10 MG tablet Take 1 tablet (10 mg total) by mouth daily. Patient not taking: Reported on 01/27/2016 05/31/15   Reva Bores, MD  meloxicam (MOBIC) 15 MG tablet Take 1 tablet (15 mg total) by mouth daily. Patient not taking: Reported on 01/29/2018 02/16/16   Cuthriell, Delorise Royals, PA-C  metoprolol tartrate (LOPRESSOR) 25 MG tablet Take 2 tablets (50 mg total) by mouth 2 (two) times daily. 02/12/20   Bethann Berkshire, MD    Allergies    Patient has no known allergies.  Review of Systems   Review of Systems  Constitutional: Negative for appetite change and fatigue.  HENT: Negative for congestion, ear discharge and sinus pressure.   Eyes: Negative for discharge.  Respiratory: Negative for cough.   Cardiovascular: Negative for chest pain.  Gastrointestinal: Negative for abdominal pain and diarrhea.  Genitourinary: Negative for frequency and hematuria.  Musculoskeletal: Negative for back pain.       Dog bite right lower leg  Skin: Negative for rash.  Neurological: Negative for seizures and headaches.  Psychiatric/Behavioral: Negative for hallucinations.    Physical Exam Updated Vital Signs BP 120/60   Pulse (!) 109   Temp 99.1 F (37.3 C) (Oral)   Resp (!) 25   SpO2 99%   Physical Exam Vitals reviewed.  Constitutional:      Appearance: She is well-developed.  HENT:     Head: Normocephalic.     Mouth/Throat:     Mouth: Mucous membranes are moist.  Eyes:     General: No scleral icterus.    Conjunctiva/sclera: Conjunctivae normal.  Neck:     Thyroid: No thyromegaly.  Cardiovascular:     Heart sounds: No murmur heard.  No friction rub. No  gallop.      Comments: Tachycardia Pulmonary:     Breath sounds: No stridor. No wheezing or rales.  Chest:     Chest wall: No tenderness.  Abdominal:     General: There is no distension.     Tenderness: There is no abdominal tenderness. There is no rebound.  Musculoskeletal:     Cervical back: Neck supple.     Comments: Lower leg shows large laceration.  Straight laceration  Lymphadenopathy:     Cervical: No cervical adenopathy.  Skin:    Findings: No erythema or rash.  Neurological:     Mental Status: She is alert and oriented to person, place, and time.     Motor: No abnormal muscle tone.     Coordination: Coordination normal.  Psychiatric:        Behavior: Behavior normal.     ED Results / Procedures / Treatments   Labs (all labs ordered are listed, but only abnormal results are displayed) Labs Reviewed  CBC WITH DIFFERENTIAL/PLATELET - Abnormal; Notable for the following components:      Result Value   WBC 10.7 (*)    Neutro Abs 7.9 (*)    All other components within normal limits  COMPREHENSIVE METABOLIC PANEL - Abnormal; Notable for the following components:   Glucose, Bld 110 (*)    All other components within normal limits  TSH    EKG None  Radiology No results found.  Procedures Procedures (including critical care time)  Medications Ordered in ED Medications  metoprolol tartrate (LOPRESSOR) tablet 25 mg (has no administration in time range)  amoxicillin-clavulanate (AUGMENTIN) 875-125 MG per tablet 1 tablet (has no administration in time range)  sodium chloride 0.9 % bolus 1,000 mL (0 mLs Intravenous Stopped 02/12/20 2044)  Tdap (BOOSTRIX) injection 0.5 mL (0.5 mLs Intramuscular Given 02/12/20 2004)  lidocaine-EPINEPHrine (XYLOCAINE W/EPI) 1 %-1:100000 (with pres) injection 10 mL (10 mLs Intradermal Given by Other 02/12/20 2003)  metoprolol tartrate (LOPRESSOR) injection 5 mg (5 mg Intravenous Given 02/12/20 2027)    ED Course  I have reviewed the  triage vital signs and the nursing notes.  Pertinent labs & imaging results that were available during my care of the patient were reviewed by me and considered in my medical decision making (see chart for details).    CRITICAL CARE Performed by: Bethann Berkshire Total critical care time: 45 minutes Critical care time was exclusive of separately billable procedures and treating other patients. Critical care was necessary  to treat or prevent imminent or life-threatening deterioration. Critical care was time spent personally by me on the following activities: development of treatment plan with patient and/or surrogate as well as nursing, discussions with consultants, evaluation of patient's response to treatment, examination of patient, obtaining history from patient or surrogate, ordering and performing treatments and interventions, ordering and review of laboratory studies, ordering and review of radiographic studies, pulse oximetry and re-evaluation of patient's condition.  MDM Rules/Calculators/A&P                          Patient with a dog bite to the right lower leg.  It was closed loosely with 2 stitches.  Patient also has tachycardia that was discussed with cardiology office she will be placed on metoprolol 25 twice daily and follow-up as an outpatient        This patient presents to the ED for concern of dog bite, this involves an extensive number of treatment options, and is a complaint that carries with it a high risk of complications and morbidity.  The differential diagnosis includes dog bite   Lab Tests:   I Ordered, reviewed, and interpreted labs, which included CBC chemistries which show mild leukocytosis  Medicines ordered:   I ordered medication antibiotics for the dog bite and beta-blocker for the tachycardia  Imaging Studies ordered:   I ordered imaging studies which included chest x-ray  I independently visualized and interpreted imaging which showed  unremarkable  Additional history obtained:   Additional history obtained from record  Previous records obtained and reviewed.  Consultations Obtained:   I consulted cardiology and discussed lab and imaging findings  Reevaluation:  After the interventions stated above, I reevaluated the patient and found improved  Critical Interventions:  .   Final Clinical Impression(s) / ED Diagnoses Final diagnoses:  Dog bite, initial encounter  Atrial tachycardia (HCC)    Rx / DC Orders ED Discharge Orders         Ordered    amoxicillin-clavulanate (AUGMENTIN) 875-125 MG tablet  Every 12 hours        02/12/20 2119    metoprolol tartrate (LOPRESSOR) 25 MG tablet  2 times daily        02/12/20 2119           Bethann Berkshire, MD 02/16/20 1148

## 2020-02-12 NOTE — Discharge Instructions (Addendum)
Clean laceration twice a day with soap and water.  Follow-up next week for recheck.  Also follow-up with cardiology next couple weeks she can go to [facility] for follow-up in [place].  Also tell animal control tomorrow that your bit by this dog so they can follow it up

## 2020-02-12 NOTE — ED Notes (Signed)
Reviewed discharge instructions with patient. Follow-up care and medications reviewed. Patient  verbalized understanding. Patient A&Ox4, VSS, and ambulatory with steady gait upon discharge.  °

## 2020-03-17 ENCOUNTER — Emergency Department
Admission: EM | Admit: 2020-03-17 | Discharge: 2020-03-17 | Disposition: A | Discharge status: Home / Self Care | Payer: Self-pay | Attending: Emergency Medicine | Admitting: Emergency Medicine

## 2020-03-17 ENCOUNTER — Other Ambulatory Visit: Payer: Self-pay

## 2020-03-17 ENCOUNTER — Encounter: Payer: Self-pay | Admitting: Emergency Medicine

## 2020-03-17 ENCOUNTER — Emergency Department: Payer: Self-pay

## 2020-03-17 DIAGNOSIS — R52 Pain, unspecified: Secondary | ICD-10-CM

## 2020-03-17 DIAGNOSIS — J45909 Unspecified asthma, uncomplicated: Secondary | ICD-10-CM | POA: Insufficient documentation

## 2020-03-17 DIAGNOSIS — S81851D Open bite, right lower leg, subsequent encounter: Secondary | ICD-10-CM | POA: Insufficient documentation

## 2020-03-17 DIAGNOSIS — F17200 Nicotine dependence, unspecified, uncomplicated: Secondary | ICD-10-CM | POA: Insufficient documentation

## 2020-03-17 DIAGNOSIS — Z5189 Encounter for other specified aftercare: Secondary | ICD-10-CM

## 2020-03-17 DIAGNOSIS — W540XXD Bitten by dog, subsequent encounter: Secondary | ICD-10-CM | POA: Insufficient documentation

## 2020-03-17 DIAGNOSIS — Z4800 Encounter for change or removal of nonsurgical wound dressing: Secondary | ICD-10-CM | POA: Insufficient documentation

## 2020-03-17 DIAGNOSIS — W540XXA Bitten by dog, initial encounter: Secondary | ICD-10-CM

## 2020-03-17 LAB — CBC WITH DIFFERENTIAL/PLATELET
Abs Immature Granulocytes: 0.02 10*3/uL (ref 0.00–0.07)
Basophils Absolute: 0.1 10*3/uL (ref 0.0–0.1)
Basophils Relative: 1 %
Eosinophils Absolute: 0.1 10*3/uL (ref 0.0–0.5)
Eosinophils Relative: 2 %
HCT: 40.3 % (ref 36.0–46.0)
Hemoglobin: 13.4 g/dL (ref 12.0–15.0)
Immature Granulocytes: 0 %
Lymphocytes Relative: 30 %
Lymphs Abs: 2.2 10*3/uL (ref 0.7–4.0)
MCH: 29.8 pg (ref 26.0–34.0)
MCHC: 33.3 g/dL (ref 30.0–36.0)
MCV: 89.6 fL (ref 80.0–100.0)
Monocytes Absolute: 0.5 10*3/uL (ref 0.1–1.0)
Monocytes Relative: 7 %
Neutro Abs: 4.5 10*3/uL (ref 1.7–7.7)
Neutrophils Relative %: 60 %
Platelets: 341 10*3/uL (ref 150–400)
RBC: 4.5 MIL/uL (ref 3.87–5.11)
RDW: 13.3 % (ref 11.5–15.5)
WBC: 7.4 10*3/uL (ref 4.0–10.5)
nRBC: 0 % (ref 0.0–0.2)

## 2020-03-17 LAB — COMPREHENSIVE METABOLIC PANEL
ALT: 17 U/L (ref 0–44)
AST: 15 U/L (ref 15–41)
Albumin: 4 g/dL (ref 3.5–5.0)
Alkaline Phosphatase: 98 U/L (ref 38–126)
Anion gap: 8 (ref 5–15)
BUN: 14 mg/dL (ref 6–20)
CO2: 24 mmol/L (ref 22–32)
Calcium: 8.9 mg/dL (ref 8.9–10.3)
Chloride: 105 mmol/L (ref 98–111)
Creatinine, Ser: 0.75 mg/dL (ref 0.44–1.00)
GFR calc Af Amer: >60 mL/min (ref >60)
GFR calc non Af Amer: >60 mL/min (ref >60)
Glucose, Bld: 97 mg/dL (ref 70–99)
Potassium: 4 mmol/L (ref 3.5–5.1)
Sodium: 137 mmol/L (ref 135–145)
Total Bilirubin: 0.5 mg/dL (ref 0.3–1.2)
Total Protein: 7.7 g/dL (ref 6.5–8.1)

## 2020-03-17 LAB — LACTIC ACID, PLASMA: Lactic Acid, Venous: 0.7 mmol/L (ref 0.5–1.9)

## 2020-03-17 LAB — POCT PREGNANCY, URINE: Preg Test, Ur: NEGATIVE

## 2020-03-17 MED ORDER — SULFAMETHOXAZOLE-TRIMETHOPRIM 800-160 MG PO TABS: 1.0000 | ORAL_TABLET | Freq: Two times a day (BID) | ORAL | 0 refills | Status: AC

## 2020-03-17 MED ORDER — AMOXICILLIN-POT CLAVULANATE 875-125 MG PO TABS: 1.0000 | ORAL_TABLET | Freq: Two times a day (BID) | ORAL | 0 refills | Status: AC

## 2020-03-17 MED ORDER — SODIUM CHLORIDE 0.9 % IV SOLN
3.0000 g | Freq: Once | INTRAVENOUS | Status: AC
  Administered 2020-03-17: 3 g via INTRAVENOUS
  Filled 2020-03-17: qty 8

## 2020-03-17 NOTE — ED Triage Notes (Signed)
States she was bitten by a dog about 1 month ago  States she had sutures but sutures came out  Presents today with redness and some drainage to right lower leg

## 2020-03-17 NOTE — ED Provider Notes (Signed)
[facility] Emergency Department Provider Note  ____________________________________________   First MD Initiated Contact with Patient 03/17/20 1106     (approximate)  I have reviewed the triage vital signs and the nursing notes.   HISTORY  Chief Complaint Wound Check  HPI Nicole Dillon is a 24 y.o. female who presents to the emergency department for evaluation of infected wound.  The patient states that she was bit by her mother's dog on 02/12/20.  She was seen at the [facility] emergency department at that time.  She states that sutures were placed however they fell out on their own.  She also states that they told her they would prescribe an antibiotic but she states that she was never told where to pick it up and thus she has not been on any antibiotic therapy in the last 4 weeks.  She states initially the wound appeared that it was going to heal but in the last 2 weeks that has been increasingly worse, and now has surrounding redness and drainage.  She denies fever, chills, or other systemic symptoms.        Past Medical History:  Diagnosis Date   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activity    Asthma    Insomnia    Irregular heart beat    Seasonal allergies     Patient Active Problem List   Diagnosis Date Noted   Tachycardia 12/10/2015   History of chlamydia 11/18/2015   Asthma, mild intermittent 05/31/2015   Other allergic rhinitis 05/31/2015   BMI 40.0-44.9, adult (HCC) 06/18/2014   Blood type, Rh negative 04/08/2014    Past Surgical History:  Procedure Laterality Date   TOOTH EXTRACTION      Prior to Admission medications   Medication Sig Start Date End Date Taking? Authorizing Provider  amoxicillin-clavulanate (AUGMENTIN) 875-125 MG tablet Take 1 tablet by mouth 2 (two) times daily for 7 days. 03/17/20 03/24/20  Lucy Chris, PA  sulfamethoxazole-trimethoprim (BACTRIM DS) 800-160 MG tablet Take 1 tablet by  mouth 2 (two) times daily for 7 days. 03/17/20 03/24/20  Lucy Chris, PA    Allergies Patient has no known allergies.  Family History  Problem Relation Age of Onset   Diabetes Mother    Diabetes Father    Kidney disease Father        dialysis   Learning disabilities Maternal Uncle        speach impediment   Heart disease Maternal Grandmother    Heart disease Maternal Grandfather     Social History Social History   Tobacco Use   Smoking status: Light Tobacco Smoker    Last attempt to quit: 03/10/2014    Years since quitting: 6.0   Smokeless tobacco: Never Used  Substance Use Topics   Alcohol use: No   Drug use: No    Review of Systems Constitutional: No fever/chills Eyes: No visual changes. ENT: No sore throat. Cardiovascular: Denies chest pain. Respiratory: Denies shortness of breath. Gastrointestinal: No abdominal pain.  No nausea, no vomiting.  No diarrhea.  No constipation. Genitourinary: Negative for dysuria. Musculoskeletal: + Right leg pain Skin: + 2 open wounds of the right leg Neurological: Negative for headaches, focal weakness or numbness.  ____________________________________________   PHYSICAL EXAM:  VITAL SIGNS: ED Triage Vitals  Enc Vitals Group     BP 03/17/20 0854 (!) 148/79     Pulse Rate 03/17/20 0854 90     Resp 03/17/20 0854 18     Temp  03/17/20 0854 98.7 F (37.1 C)     Temp Source 03/17/20 0854 Oral     SpO2 03/17/20 0854 100 %     Weight 03/17/20 0852 (!) 388 lb 0.2 oz (176 kg)     Height 03/17/20 0852 5\' 7"  (1.702 m)     Head Circumference --      Peak Flow --      Pain Score 03/17/20 0852 5     Pain Loc --      Pain Edu? --      Excl. in GC? --     Constitutional: Obese female sleeping upon each entry into the room, however easily arousable Eyes: Conjunctivae are normal. PERRL. EOMI. Head: Atraumatic. Nose: No congestion/rhinnorhea. Mouth/Throat: Mucous membranes are moist.  Oropharynx  non-erythematous. Neck: No stridor.   Cardiovascular: Normal rate, regular rhythm. Grossly normal heart sounds.  Good peripheral circulation. Respiratory: Normal respiratory effort.  No retractions. Lungs CTAB. Gastrointestinal: Soft and nontender. No distention. No abdominal bruits. No CVA tenderness. Musculoskeletal: Full range of motion of the right knee and ankle without pain. Neurologic:  Normal speech and language. No gross focal neurologic deficits are appreciated. No gait instability. Skin: There are 2 wounds of the right lower leg, located on the anteromedial mid shaft of the lower leg.  The larger wound is approximately 5 cm x 2 cm and is approximately 1.5 cm deep.  This wound has purulent, green active drainage.  There is surrounding cellulitis that is approximately 2 cm in every direction from the border edge of the wound.  The other wound is smaller, approximately 1 cm x 1 cm and is approximately 1.5 cm deep.  This wound is also draining a purulent, green. Psychiatric: Mood and affect are normal. Speech and behavior are normal.  ____________________________________________   LABS (all labs ordered are listed, but only abnormal results are displayed)  Labs Reviewed  CULTURE, BLOOD (ROUTINE X 2)  CULTURE, BLOOD (ROUTINE X 2)  AEROBIC CULTURE (SUPERFICIAL SPECIMEN)  CBC WITH DIFFERENTIAL/PLATELET  COMPREHENSIVE METABOLIC PANEL  LACTIC ACID, PLASMA  LACTIC ACID, PLASMA  POC URINE PREG, ED  POCT PREGNANCY, URINE    ____________________________________________  RADIOLOGY  Official radiology report(s): DG Tibia/Fibula Right  Result Date: 03/17/2020 CLINICAL DATA:  Dog bite 1 month ago with persistent pain and swelling, initial encounter EXAM: RIGHT TIBIA AND FIBULA - 2 VIEW COMPARISON:  None. FINDINGS: Soft tissue defect is noted medially consistent with the given clinical history. Generalized soft tissue swelling is seen. No radiopaque foreign body is noted. No underlying  bony abnormality is seen. IMPRESSION: Soft tissue defect consistent with the given clinical history. Electronically Signed   By: 03/19/2020 M.D.   On: 03/17/2020 12:09    ____________________________________________   INITIAL IMPRESSION / ASSESSMENT AND PLAN / ED COURSE  As part of my medical decision making, I reviewed the following data within the electronic MEDICAL RECORD NUMBER Nursing notes reviewed and incorporated and Notes from prior ED visits        Latyra Jaye is a 24 year old female who presents to the emergency department for evaluation of a month old dog bite.  She reports that she did not take the prescribed antibiotic because she was not told her to pick it up.  Physical exam demonstrates relatively normal vital signs with a poorly healing wound with active signs of infection with surrounding cellulitis.   Laboratory evaluation includes a normal CBC, CMP and lactic acid.  Wound and blood cultures are pending.  X-ray of the right tib-fib demonstrates soft tissue wounds without any extension to the bones.  At this time, will begin her treatment with IV Unasyn.  Will initiate outpatient oral antibiotic therapy with Augmentin and will add Bactrim for MRSA coverage.  I personally highlighted her prescription and pharmacy for the patient and reviewed where she can pick these up in addition to the nursing staff.  She was also referred to the wound clinic for further care.  The patient is understanding that she needs close follow-up for this.   ____________________________________________   FINAL CLINICAL IMPRESSION(S) / ED DIAGNOSES  Final diagnoses:  Visit for wound check  Dog bite, initial encounter     ED Discharge Orders         Ordered    amoxicillin-clavulanate (AUGMENTIN) 875-125 MG tablet  2 times daily        03/17/20 1315    sulfamethoxazole-trimethoprim (BACTRIM DS) 800-160 MG tablet  2 times daily        03/17/20 1315          *Please note:  Malory Spurr was evaluated in Emergency Department on 03/17/2020 for the symptoms described in the history of present illness. She was evaluated in the context of the global COVID-19 pandemic, which necessitated consideration that the patient might be at risk for infection with the SARS-CoV-2 virus that causes COVID-19. Institutional protocols and algorithms that pertain to the evaluation of patients at risk for COVID-19 are in a state of rapid change based on information released by regulatory bodies including the CDC and federal and state organizations. These policies and algorithms were followed during the patient's care in the ED.  Some ED evaluations and interventions may be delayed as a result of limited staffing during and the pandemic.*   Note:  This document was prepared using Dragon voice recognition software and may include unintentional dictation errors.    Lucy Chris, PA 03/17/20 1633    Minna Antis, MD 03/18/20 2006

## 2020-03-22 LAB — AEROBIC CULTURE W GRAM STAIN (SUPERFICIAL SPECIMEN)

## 2020-03-22 LAB — CULTURE, BLOOD (ROUTINE X 2)
Culture: NO GROWTH
Culture: NO GROWTH
Special Requests: ADEQUATE

## 2020-04-29 ENCOUNTER — Ambulatory Visit: Payer: Self-pay | Admitting: Physician Assistant

## 2020-10-19 ENCOUNTER — Emergency Department: Payer: Self-pay

## 2020-10-19 ENCOUNTER — Other Ambulatory Visit: Payer: Self-pay

## 2020-10-19 ENCOUNTER — Inpatient Hospital Stay
Admission: EM | Admit: 2020-10-19 | Discharge: 2020-10-23 | DRG: 193 | Disposition: A | Discharge status: Home / Self Care | Payer: Self-pay | Attending: Internal Medicine | Admitting: Internal Medicine

## 2020-10-19 DIAGNOSIS — D75839 Thrombocytosis, unspecified: Secondary | ICD-10-CM | POA: Diagnosis present

## 2020-10-19 DIAGNOSIS — I471 Supraventricular tachycardia, unspecified: Secondary | ICD-10-CM

## 2020-10-19 DIAGNOSIS — N179 Acute kidney failure, unspecified: Secondary | ICD-10-CM | POA: Diagnosis present

## 2020-10-19 DIAGNOSIS — Z833 Family history of diabetes mellitus: Secondary | ICD-10-CM

## 2020-10-19 DIAGNOSIS — J189 Pneumonia, unspecified organism: Secondary | ICD-10-CM

## 2020-10-19 DIAGNOSIS — Z20822 Contact with and (suspected) exposure to covid-19: Secondary | ICD-10-CM | POA: Diagnosis present

## 2020-10-19 DIAGNOSIS — I4891 Unspecified atrial fibrillation: Secondary | ICD-10-CM | POA: Diagnosis present

## 2020-10-19 DIAGNOSIS — Z8249 Family history of ischemic heart disease and other diseases of the circulatory system: Secondary | ICD-10-CM

## 2020-10-19 DIAGNOSIS — I4892 Unspecified atrial flutter: Secondary | ICD-10-CM | POA: Diagnosis present

## 2020-10-19 DIAGNOSIS — F419 Anxiety disorder, unspecified: Secondary | ICD-10-CM | POA: Diagnosis present

## 2020-10-19 DIAGNOSIS — R7303 Prediabetes: Secondary | ICD-10-CM | POA: Diagnosis present

## 2020-10-19 DIAGNOSIS — Z6841 Body Mass Index (BMI) 40.0 and over, adult: Secondary | ICD-10-CM

## 2020-10-19 DIAGNOSIS — I429 Cardiomyopathy, unspecified: Secondary | ICD-10-CM | POA: Diagnosis present

## 2020-10-19 DIAGNOSIS — F17200 Nicotine dependence, unspecified, uncomplicated: Secondary | ICD-10-CM | POA: Diagnosis present

## 2020-10-19 DIAGNOSIS — I5023 Acute on chronic systolic (congestive) heart failure: Secondary | ICD-10-CM | POA: Diagnosis present

## 2020-10-19 DIAGNOSIS — G4700 Insomnia, unspecified: Secondary | ICD-10-CM | POA: Diagnosis present

## 2020-10-19 DIAGNOSIS — J4520 Mild intermittent asthma, uncomplicated: Secondary | ICD-10-CM | POA: Diagnosis present

## 2020-10-19 DIAGNOSIS — R000 Tachycardia, unspecified: Secondary | ICD-10-CM | POA: Diagnosis present

## 2020-10-19 DIAGNOSIS — Z79899 Other long term (current) drug therapy: Secondary | ICD-10-CM

## 2020-10-19 HISTORY — DX: Pneumonia, unspecified organism: J18.9

## 2020-10-19 LAB — CBC
HCT: 37.8 % (ref 36.0–46.0)
Hemoglobin: 12.5 g/dL (ref 12.0–15.0)
MCH: 29.8 pg (ref 26.0–34.0)
MCHC: 33.1 g/dL (ref 30.0–36.0)
MCV: 90 fL (ref 80.0–100.0)
Platelets: 412 10*3/uL — ABNORMAL HIGH (ref 150–400)
RBC: 4.2 MIL/uL (ref 3.87–5.11)
RDW: 14 % (ref 11.5–15.5)
WBC: 12.9 10*3/uL — ABNORMAL HIGH (ref 4.0–10.5)
nRBC: 0 % (ref 0.0–0.2)

## 2020-10-19 LAB — BASIC METABOLIC PANEL
Anion gap: 8 (ref 5–15)
BUN: 10 mg/dL (ref 6–20)
CO2: 21 mmol/L — ABNORMAL LOW (ref 22–32)
Calcium: 8.4 mg/dL — ABNORMAL LOW (ref 8.9–10.3)
Chloride: 107 mmol/L (ref 98–111)
Creatinine, Ser: 0.86 mg/dL (ref 0.44–1.00)
GFR, Estimated: >60 mL/min (ref >60)
Glucose, Bld: 110 mg/dL — ABNORMAL HIGH (ref 70–99)
Potassium: 3.9 mmol/L (ref 3.5–5.1)
Sodium: 136 mmol/L (ref 135–145)

## 2020-10-19 LAB — RESP PANEL BY RT-PCR (FLU A&B, COVID) ARPGX2
Influenza A by PCR: NEGATIVE
Influenza B by PCR: NEGATIVE
SARS Coronavirus 2 by RT PCR: NEGATIVE

## 2020-10-19 LAB — TROPONIN I (HIGH SENSITIVITY)
Troponin I (High Sensitivity): 9 ng/L (ref <18)
Troponin I (High Sensitivity): 8 ng/L (ref <18)

## 2020-10-19 LAB — D-DIMER, QUANTITATIVE: D-Dimer, Quant: 3.38 ug/mL-FEU — ABNORMAL HIGH (ref 0.00–0.50)

## 2020-10-19 LAB — LACTIC ACID, PLASMA: Lactic Acid, Venous: 1.2 mmol/L (ref 0.5–1.9)

## 2020-10-19 LAB — BRAIN NATRIURETIC PEPTIDE: B Natriuretic Peptide: 381.7 pg/mL — ABNORMAL HIGH (ref 0.0–100.0)

## 2020-10-19 MED ORDER — SODIUM CHLORIDE 0.9 % IV SOLN
500.0000 mg | INTRAVENOUS | Status: DC
  Administered 2020-10-19 – 2020-10-21 (×3): 500 mg via INTRAVENOUS
  Filled 2020-10-19 (×3): qty 500

## 2020-10-19 MED ORDER — SODIUM CHLORIDE 0.9 % IV SOLN
1.0000 g | Freq: Once | INTRAVENOUS | Status: AC
  Administered 2020-10-19: 1 g via INTRAVENOUS
  Filled 2020-10-19: qty 10

## 2020-10-19 MED ORDER — SODIUM CHLORIDE 0.9 % IV SOLN
500.0000 mg | Freq: Once | INTRAVENOUS | Status: DC
  Filled 2020-10-19: qty 500

## 2020-10-19 MED ORDER — DILTIAZEM HCL-DEXTROSE 125-5 MG/125ML-% IV SOLN (PREMIX)
5.0000 mg/h | INTRAVENOUS | Status: DC
  Administered 2020-10-20: 2.5 mg/h via INTRAVENOUS

## 2020-10-19 MED ORDER — IOHEXOL 350 MG/ML SOLN
75.0000 mL | Freq: Once | INTRAVENOUS | Status: AC | PRN
  Administered 2020-10-19: 75 mL via INTRAVENOUS

## 2020-10-19 MED ORDER — ADENOSINE 6 MG/2ML IV SOLN
INTRAVENOUS | Status: AC
  Filled 2020-10-19: qty 2

## 2020-10-19 MED ORDER — SODIUM CHLORIDE 0.9 % IV BOLUS
1000.0000 mL | Freq: Once | INTRAVENOUS | Status: AC
  Administered 2020-10-19: 1000 mL via INTRAVENOUS

## 2020-10-19 MED ORDER — SODIUM CHLORIDE 0.9 % IV SOLN
2.0000 g | INTRAVENOUS | Status: DC
  Administered 2020-10-20 – 2020-10-22 (×3): 2 g via INTRAVENOUS
  Filled 2020-10-19: qty 20
  Filled 2020-10-19 (×2): qty 2
  Filled 2020-10-19: qty 20

## 2020-10-19 MED ORDER — DILTIAZEM HCL-DEXTROSE 125-5 MG/125ML-% IV SOLN (PREMIX)
5.0000 mg/h | INTRAVENOUS | Status: DC
  Filled 2020-10-19: qty 125

## 2020-10-19 MED ORDER — DILTIAZEM HCL 25 MG/5ML IV SOLN
5.0000 mg | Freq: Once | INTRAVENOUS | Status: AC
  Administered 2020-10-19: 5 mg via INTRAVENOUS
  Filled 2020-10-19: qty 5

## 2020-10-19 MED ORDER — ENOXAPARIN SODIUM 40 MG/0.4ML IJ SOSY: 40.0000 mg | PREFILLED_SYRINGE | INTRAMUSCULAR | Status: DC

## 2020-10-19 MED ORDER — ADENOSINE 12 MG/4ML IV SOLN
INTRAVENOUS | Status: AC
  Filled 2020-10-19: qty 4

## 2020-10-19 MED ORDER — ENOXAPARIN SODIUM 80 MG/0.8ML IJ SOSY
0.5000 mg/kg | PREFILLED_SYRINGE | INTRAMUSCULAR | Status: DC
  Administered 2020-10-19 – 2020-10-21 (×3): 80 mg via SUBCUTANEOUS
  Filled 2020-10-19 (×3): qty 0.8

## 2020-10-19 NOTE — ED Notes (Signed)
ED Provider at bedside. 

## 2020-10-19 NOTE — Progress Notes (Signed)
   10/19/20 2315  Assess: MEWS Score  Temp 98.1 F (36.7 C)  BP 121/66  Pulse Rate (!) 120  ECG Heart Rate (!) 120  Resp (!) 35  SpO2 98 %  O2 Device Room Air  Assess: MEWS Score  MEWS Temp 0  MEWS Systolic 0  MEWS Pulse 2  MEWS RR 2  MEWS LOC 0  MEWS Score 4  MEWS Score Color Red  Assess: if the MEWS score is Yellow or Red  Were vital signs taken at a resting state? Yes  Focused Assessment No change from prior assessment  Early Detection of Sepsis Score *See Row Information* Low  MEWS guidelines implemented *See Row Information* No, previously red, continue vital signs every 4 hours  Treat  MEWS Interventions Other (Comment) (On cardizem gtt)  Pain Scale 0-10  Pain Score 0  Notify: Charge Nurse/RN  Name of Charge Nurse/RN Notified Idalia Needle RN  Date Charge Nurse/RN Notified 10/19/20  Time Charge Nurse/RN Notified 2329  Document  Progress note created (see row info) Yes

## 2020-10-19 NOTE — ED Provider Notes (Signed)
[facility] Emergency Department Provider Note ____________________________________________   Event Date/Time   First MD Initiated Contact with Patient 10/19/20 1719     (approximate)  I have reviewed the triage vital signs and the nursing notes.   HISTORY  Chief Complaint Asthma and Tachycardia    HPI Nicole Dillon is a 25 y.o. female with PMH as noted below including asthma and history of a "irregular heartbeat" who presents with shortness of breath over the last 2 weeks, somewhat worse today, worse with exertion, and associated with tightness in her chest but no chest pain.  She denies any dizziness or lightheadedness, and has no palpitations.  She has not taken anything for it at home as she states she did not have any albuterol for her nebulizer.  She has no recent leg swelling or pain.  Past Medical History:  Diagnosis Date  . 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activity   . Asthma   . Insomnia   . Irregular heart beat   . Seasonal allergies     Patient Active Problem List   Diagnosis Date Noted  . Bilateral pneumonia 10/19/2020  . Tachyarrhythmia 12/10/2015  . History of chlamydia 11/18/2015  . Asthma, mild intermittent 05/31/2015  . Other allergic rhinitis 05/31/2015  . Morbid obesity with BMI of 50.0-59.9, adult (HCC) 06/18/2014  . Blood type, Rh negative 04/08/2014    Past Surgical History:  Procedure Laterality Date  . TOOTH EXTRACTION      Prior to Admission medications   Not on File    Allergies Patient has no known allergies.  Family History  Problem Relation Age of Onset  . Diabetes Mother   . Diabetes Father   . Kidney disease Father        dialysis  . Learning disabilities Maternal Uncle        speach impediment  . Heart disease Maternal Grandmother   . Heart disease Maternal Grandfather     Social History Social History   Tobacco Use  . Smoking status: Light Tobacco Smoker    Last attempt to  quit: 03/10/2014    Years since quitting: 6.6  . Smokeless tobacco: Never Used  Substance Use Topics  . Alcohol use: No  . Drug use: No    Review of Systems  Constitutional: No fever/chills. Eyes: No visual changes. ENT: No sore throat. Cardiovascular: Denies chest pain. Respiratory: Positive for shortness of breath. Gastrointestinal: No vomiting or diarrhea.  Genitourinary: Negative for dysuria.  Musculoskeletal: Negative for back pain. Skin: Negative for rash. Neurological: Negative for headache.   ____________________________________________   PHYSICAL EXAM:  VITAL SIGNS: ED Triage Vitals  Enc Vitals Group     BP 10/19/20 1659 (!) 119/52     Pulse Rate 10/19/20 1659 (!) 185     Resp 10/19/20 1659 (!) 24     Temp 10/19/20 1659 98.6 F (37 C)     Temp Source 10/19/20 1659 Oral     SpO2 10/19/20 1659 97 %     Weight 10/19/20 1707 (!) 350 lb (158.8 kg)     Height 10/19/20 1707 5\' 6"  (1.676 m)     Head Circumference --      Peak Flow --      Pain Score 10/19/20 1707 0     Pain Loc --      Pain Edu? --      Excl. in GC? --     Constitutional: Alert and oriented. Well appearing and in no acute  distress. Eyes: Conjunctivae are normal.  Head: Atraumatic. Nose: No congestion/rhinnorhea. Mouth/Throat: Mucous membranes are moist.   Neck: Normal range of motion.  Cardiovascular: Tachycardic, regular rhythm. Grossly normal heart sounds.  Good peripheral circulation. Respiratory: Normal respiratory effort.  No retractions. Lungs with slightly diminished breath sounds bilaterally but no significant wheezing or rales. Gastrointestinal: No distention.  Musculoskeletal: No lower extremity edema.  No calf or popliteal swelling or tenderness.  Extremities warm and well perfused.  Neurologic:  Normal speech and language. No gross focal neurologic deficits are appreciated.  Skin:  Skin is warm and dry. No rash noted. Psychiatric: Mood and affect are normal. Speech and behavior  are normal.  ____________________________________________   LABS (all labs ordered are listed, but only abnormal results are displayed)  Labs Reviewed  BASIC METABOLIC PANEL - Abnormal; Notable for the following components:      Result Value   CO2 21 (*)    Glucose, Bld 110 (*)    Calcium 8.4 (*)    All other components within normal limits  CBC - Abnormal; Notable for the following components:   WBC 12.9 (*)    Platelets 412 (*)    All other components within normal limits  BRAIN NATRIURETIC PEPTIDE - Abnormal; Notable for the following components:   B Natriuretic Peptide 381.7 (*)    All other components within normal limits  D-DIMER, QUANTITATIVE - Abnormal; Notable for the following components:   D-Dimer, Quant 3.38 (*)    All other components within normal limits  RESP PANEL BY RT-PCR (FLU A&B, COVID) ARPGX2  CULTURE, BLOOD (ROUTINE X 2)  CULTURE, BLOOD (ROUTINE X 2)  LACTIC ACID, PLASMA  LACTIC ACID, PLASMA  TSH  HIV ANTIBODY (ROUTINE TESTING W REFLEX)  POC URINE PREG, ED  TROPONIN I (HIGH SENSITIVITY)  TROPONIN I (HIGH SENSITIVITY)   ____________________________________________  EKG  ED ECG REPORT I, Dionne Bucy, the attending physician, personally viewed and interpreted this ECG.  Date: 10/19/2020 EKG Time: 1658 Rate: 184 Rhythm: Sinus tachycardia QRS Axis: normal Intervals: normal ST/T Wave abnormalities: normal Narrative Interpretation: Sinus tachycardia with no evidence of acute ischemia  ED ECG REPORT I, Dionne Bucy, the attending physician, personally viewed and interpreted this ECG.  Date: 10/19/2020 EKG Time: 1736 Rate: 148 Rhythm: Atrial fibrillation with RVR QRS Axis: normal Intervals: normal ST/T Wave abnormalities: Nonspecific T wave abnormality Narrative Interpretation: Atrial fibrillation with RVR; no evidence of acute ischemia   ____________________________________________  RADIOLOGY  Chest x-ray interpreted by me  shows mild cardiomegaly with no focal consolidation or edema CT angio chest: No acute PE.  Bilateral lower lung infiltrates ____________________________________________   PROCEDURES  Procedure(s) performed: No  Procedures  Critical Care performed: Yes  CRITICAL CARE Performed by: Dionne Bucy   Total critical care time: 30 minutes  Critical care time was exclusive of separately billable procedures and treating other patients.  Critical care was necessary to treat or prevent imminent or life-threatening deterioration.  Critical care was time spent personally by me on the following activities: development of treatment plan with patient and/or surrogate as well as nursing, discussions with consultants, evaluation of patient's response to treatment, examination of patient, obtaining history from patient or surrogate, ordering and performing treatments and interventions, ordering and review of laboratory studies, ordering and review of radiographic studies, pulse oximetry and re-evaluation of patient's condition. ____________________________________________   INITIAL IMPRESSION / ASSESSMENT AND PLAN / ED COURSE  Pertinent labs & imaging results that were available during my care of the  patient were reviewed by me and considered in my medical decision making (see chart for details).  25 year old female with PMH as noted above including a history of asthma and a "regular heart beat" presents with shortness of breath over the last 2 weeks which she attributed to possible asthma associated with chest tightness but no lightheadedness or palpitations.  She was found to be tachycardic to the 180s.  I reviewed the past medical records in Epic.  The patient has had a few ED visits over the last several years for unrelated symptoms, but no prior visits for asthma or shortness of breath.  On exam, the patient is overall very well-appearing.  Her vital signs are normal except for a heart  rate in the 180s which appears to be sinus rhythm on the initial EKG (although difficult to interpret with certainty due to the high rate).  Lungs are clear to auscultation.  There is no peripheral edema.  Exam is otherwise unremarkable.  We have placed the patient on the monitor and ZOLL defibrillator.  Overall I have a low suspicion for asthma exacerbation given the lack of wheezing.  It appears more likely that the patient has shortness of breath related to the tachycardia itself.  Although it appears sinus on the initial EKG it is difficult to be certain given the very high rate.  Differential includes SVT, atrial fibrillation with RVR, or primary sinus tachycardia.  Think also be related to dehydration.  The patient is not on any medications.  I do not suspect PE given the lack of hypoxia, respiratory distress, chest pain, or DVT symptoms.  We will obtain basic labs, troponin, BNP, D-dimer, chest x-ray, and give Cardizem and fluids for rate control (and to further evaluate the rhythm).  ----------------------------------------- 7:52 PM on 10/19/2020 -----------------------------------------  The rate went down to the 140s with the initial Cardizem, revealing atrial fibrillation with RVR rather than sinus rhythm.  We have initiated a Cardizem infusion.  D-dimer is elevated, so we will obtain CT angio to rule out PE.  ----------------------------------------- 9:29 PM on 10/19/2020 -----------------------------------------  CT is negative for PE but does show bilateral infiltrates.  COVID is negative.  I initiated antibiotics for CAP.  I consulted Dr. Para March for admission.   ____________________________________________   FINAL CLINICAL IMPRESSION(S) / ED DIAGNOSES  Final diagnoses:  Atrial fibrillation with RVR (HCC)  Community acquired pneumonia, unspecified laterality      NEW MEDICATIONS STARTED DURING THIS VISIT:  There are no discharge medications for this  patient.    Note:  This document was prepared using Dragon voice recognition software and may include unintentional dictation errors.    Dionne Bucy, MD 10/20/20 [phone]

## 2020-10-19 NOTE — Progress Notes (Signed)
PHARMACIST - PHYSICIAN COMMUNICATION  CONCERNING:  Enoxaparin (Lovenox) for DVT Prophylaxis   DESCRIPTION: Patient was prescribed enoxaprin 40mg  q24 hours for VTE prophylaxis.   Filed Weights   10/19/20 1707  Weight: (!) 158.8 kg (350 lb)    Body mass index is 56.49 kg/m.  Estimated Creatinine Clearance: 156.4 mL/min (by C-G formula based on SCr of 0.86 mg/dL).   Based on [facility] policy patient is candidate for enoxaparin 0.5mg /kg TBW SQ every 24 hours based on BMI being >30.  RECOMMENDATION: Pharmacy has adjusted enoxaparin dose per [facility] policy.  Patient is now receiving enoxaparin 80 mg every 24 hours    [facility], PharmD Clinical Pharmacist  10/19/2020 10:40 PM

## 2020-10-19 NOTE — H&P (Signed)
History and Physical    Nicole Dillon KGM:[id number] DOB: 10/14/1995 DOA: 10/19/2020  [facility]P: [facility]   Patient coming from: Home  I have personally briefly reviewed patient's old medical records in [facility]  Chief Complaint: Cough  HPI: Nicole Dillon is a 25 y.o. female with medical history significant for Asthma, morbid obesity, irregular heart rate since childhood, chronic tachycardia as far back as 2017 when patient had Holter monitor with findings of sinus tachycardia with PVCs, rate 80-189, average 145, with heart rate in the 180s during an ER visit in summer 2021 placed on oral metoprolol at that time, and now presents to the emergency room with a complaint of wheezing, shortness of breath, congested cough and seasonal allergy symptoms x1 week.  Denies fever or chills and had a negative COVID test however symptoms persisted.  She denies chest pain, nausea or vomiting or abdominal pain.  Denies myalgias ED course: Upon arrival noted to have heart rate of 185, with BP 119/52, respirations 24 with O2 sat 97% on room air.  Temperature 98.6.  Blood work showed leukocytosis of 13,000, D-dimer 3.38, BNP 381 and troponin of 9 EKG: Several EKGs done in the emergency room prior to and subsequent to administration of a diltiazem bolus.  Initially showed sinus tachycardia with rate 185.  Following diltiazem read as A. fib at 140 with no acute ST-T wave changes Imaging: CTA chest: Negative for PE but with a moderate severity bilateral lower lobe infiltrates  Patient started on Rocephin and azithromycin.  Was also given an IV fluid bolus.  Patient was given diltiazem bolus for her heart rate with improvement to the 130s and was subsequently placed on a diltiazem infusion.  Hospitalist consulted for admission.  Review of Systems: As per HPI otherwise all other systems on review of systems negative.    Past Medical History:  Diagnosis Date  . ADD (attention deficit  disorder) without hyperactivity   . Asthma   . Insomnia   . Irregular heart beat   . Seasonal allergies     Past Surgical History:  Procedure Laterality Date  . TOOTH EXTRACTION       reports that she has been smoking. She has never used smokeless tobacco. She reports that she does not drink alcohol and does not use drugs.  No Known Allergies  Family History  Problem Relation Age of Onset  . Diabetes Mother   . Diabetes Father   . Kidney disease Father        dialysis  . Learning disabilities Maternal Uncle        speach impediment  . Heart disease Maternal Grandmother   . Heart disease Maternal Grandfather       Prior to Admission medications   Not on File    Physical Exam: Vitals:   10/19/20 1900 10/19/20 1915 10/19/20 2000 10/19/20 2045  BP: 111/86 (!) 134/118 106/88 (!) 135/118  Pulse: (!) 149   70  Resp: (!) 22 (!) 35 (!) 28 (!) 26  Temp:      TempSrc:      SpO2: 97% 99% 96% 94%  Weight:      Height:         Vitals:   10/19/20 1900 10/19/20 1915 10/19/20 2000 10/19/20 2045  BP: 111/86 (!) 134/118 106/88 (!) 135/118  Pulse: (!) 149   70  Resp: (!) 22 (!) 35 (!) 28 (!) 26  Temp:      TempSrc:  SpO2: 97% 99% 96% 94%  Weight:      Height:          Constitutional: Alert and oriented x 3 . Not in any apparent distress HEENT:      Head: Normocephalic and atraumatic.         Eyes: PERLA, EOMI, Conjunctivae are normal. Sclera is non-icteric.       Mouth/Throat: Mucous membranes are moist.       Neck: Supple with no signs of meningismus. Cardiovascular:  Tachycardic. No murmurs, gallops, or rubs. 2+ symmetrical distal pulses are present . No JVD. No LE edema Respiratory: Respiratory effort normal .Lungs sounds diminished at bases bilaterally. No wheezes, crackles, or rhonchi.  Gastrointestinal: Soft, non tender, and non distended with positive bowel sounds.  Genitourinary: No CVA tenderness. Musculoskeletal: Nontender with normal range of motion  in all extremities. No cyanosis, or erythema of extremities. Neurologic:  Face is symmetric. Moving all extremities. No gross focal neurologic deficits . Skin: Skin is warm, dry.  No rash or ulcers Psychiatric: Mood and affect are normal    Labs on Admission: I have personally reviewed following labs and imaging studies  CBC: Recent Labs  Lab 10/19/20 1704  WBC 12.9*  HGB 12.5  HCT 37.8  MCV 90.0  PLT 412*   Basic Metabolic Panel: Recent Labs  Lab 10/19/20 1704  NA 136  K 3.9  CL 107  CO2 21*  GLUCOSE 110*  BUN 10  CREATININE 0.86  CALCIUM 8.4*   GFR: Estimated Creatinine Clearance: 156.4 mL/min (by C-G formula based on SCr of 0.86 mg/dL). Liver Function Tests: No results for input(s): AST, ALT, ALKPHOS, BILITOT, PROT, ALBUMIN in the last 168 hours. No results for input(s): LIPASE, AMYLASE in the last 168 hours. No results for input(s): AMMONIA in the last 168 hours. Coagulation Profile: No results for input(s): INR, PROTIME in the last 168 hours. Cardiac Enzymes: No results for input(s): CKTOTAL, CKMB, CKMBINDEX, TROPONINI in the last 168 hours. BNP (last 3 results) No results for input(s): PROBNP in the last 8760 hours. HbA1C: No results for input(s): HGBA1C in the last 72 hours. CBG: No results for input(s): GLUCAP in the last 168 hours. Lipid Profile: No results for input(s): CHOL, HDL, LDLCALC, TRIG, CHOLHDL, LDLDIRECT in the last 72 hours. Thyroid Function Tests: No results for input(s): TSH, T4TOTAL, FREET4, T3FREE, THYROIDAB in the last 72 hours. Anemia Panel: No results for input(s): VITAMINB12, FOLATE, FERRITIN, TIBC, IRON, RETICCTPCT in the last 72 hours. Urine analysis:    Component Value Date/Time   COLORURINE YELLOW (A) 01/29/2018 0845   APPEARANCEUR CLOUDY (A) 01/29/2018 0845   LABSPEC 1.013 01/29/2018 0845   PHURINE 6.0 01/29/2018 0845   GLUCOSEU NEGATIVE 01/29/2018 0845   HGBUR SMALL (A) 01/29/2018 0845   BILIRUBINUR NEGATIVE 01/29/2018  0845   KETONESUR NEGATIVE 01/29/2018 0845   PROTEINUR NEGATIVE 01/29/2018 0845   UROBILINOGEN 0.2 10/21/2014 0900   NITRITE NEGATIVE 01/29/2018 0845   LEUKOCYTESUR TRACE (A) 01/29/2018 0845    Radiological Exams on Admission: CT Angio Chest PE W and/or Wo Contrast  Result Date: 10/19/2020 CLINICAL DATA:  Difficulty breathing. EXAM: CT ANGIOGRAPHY CHEST WITH CONTRAST TECHNIQUE: Multidetector CT imaging of the chest was performed using the standard protocol during bolus administration of intravenous contrast. Multiplanar CT image reconstructions and MIPs were obtained to evaluate the vascular anatomy. CONTRAST:  42mL OMNIPAQUE IOHEXOL 350 MG/ML SOLN COMPARISON:  None. FINDINGS: Cardiovascular: Satisfactory opacification of the pulmonary arteries to the segmental level. No evidence  of pulmonary embolism. Normal heart size. No pericardial effusion. Mediastinum/Nodes: No enlarged mediastinal, hilar, or axillary lymph nodes. Thyroid gland, trachea, and esophagus demonstrate no significant findings. Lungs/Pleura: A 1.0 cm noncalcified lung nodule is seen within the anterolateral aspect of the right apex (axial CT image 18, CT series number 6). Moderate severity patchy infiltrates are seen within the bilateral lower lobes. Small bilateral pleural effusions are noted. No pneumothorax is identified. Upper Abdomen: No acute abnormality. Musculoskeletal: No chest wall abnormality. No acute or significant osseous findings. Review of the MIP images confirms the above findings. IMPRESSION: 1. Moderate severity bilateral lower lobe infiltrates. 2. Small bilateral pleural effusions. 3. 1.0 cm noncalcified right apical lung nodule. Consider one of the following in 3 months for both low-risk and high-risk individuals: (a) repeat chest CT, (b) follow-up PET-CT, or (c) tissue sampling. This recommendation follows the consensus statement: Guidelines for Management of Incidental Pulmonary Nodules Detected on CT Images: From the  Fleischner Society 2017; Radiology 2017; 284:228-243. Electronically Signed   By: Aram Candelahaddeus  Houston M.D.   On: 10/19/2020 20:47   DG Chest Portable 1 View  Result Date: 10/19/2020 CLINICAL DATA:  Shortness of breath EXAM: PORTABLE CHEST 1 VIEW COMPARISON:  02/12/2020 FINDINGS: Mild cardiomegaly. No focal opacity or pleural effusion. No pneumothorax IMPRESSION: No active disease.  Mild cardiomegaly Electronically Signed   By: Jasmine PangKim  Fujinaga M.D.   On: 10/19/2020 17:53     Assessment/Plan 25 year old female with history of asthma, morbid obesity, irregular heart rate since childhood and chronic tachycardia average heart rate 145 on Holter from 2017 presenting with a complaint of wheezing, shortness of breath, congested cough and seasonal allergy symptoms x1 week.  Heart rate 185 in triage.      Bilateral pneumonia    Asthma/seasonal allergies - Patient with cough, URI symptoms, wheezing, leukocytosis of 13,000.  No fever.'s tachycardia somewhat above her baseline - CTA chest showing bilateral lower lobe infiltrates - IV Rocephin and azithromycin - Xopenex as needed for wheezing, to hopefully not worsen tachycardia - Antitussives, Flonase, antihistamines as needed - Oxygen as needed to keep sats over 92%  Acute on  chronic tachyarrhythmia, possible A. fib - Heart rate in the ER in the 130s to 180s, with prior Holter showing average heart rate of 145 back in 2017 - EKGs initially showed sinus up to 185 but following diltiazem bolus showed A. fib at 135 - We will continue diltiazem infusion started in the ER as BP will tolerate, with high HR parameters 100-122 given chronicity of her tachycardia - Cardiology consult - Echocardiogram, given elevated BNP    Morbid obesity with BMI of 50.0-59.9, adult (HCC) - Complicating factor to overall prognosis and care    DVT prophylaxis: Lovenox  Code Status: full code  Family Communication:  none  Disposition Plan: Back to previous home  environment Consults called: Cardiology Status:At the time of admission, it appears that the appropriate admission status for this patient is INPATIENT. This is judged to be reasonable and necessary in order to provide the required intensity of service to ensure the patient's safety given the presenting symptoms, physical exam findings, and initial radiographic and laboratory data in the context of their  Comorbid conditions.   Patient requires inpatient status due to high intensity of service, high risk for further deterioration and high frequency of surveillance required.   I certify that at the point of admission it is my clinical judgment that the patient will require inpatient hospital care spanning beyond 2 midnights  Andris Baumann MD [facility]     10/19/2020, 9:32 PM

## 2020-10-19 NOTE — ED Notes (Signed)
Message MD regarding hr 150

## 2020-10-19 NOTE — ED Triage Notes (Signed)
Pt states she has a hx of asthma, states that she usually has trouble with allergies this time of year, states that she was covid tested and negative last week, states that she has been feeling this way for the past 2 weeks, pt's heart rate in triage is 185, states that she has a hx of irreg heart beat

## 2020-10-20 DIAGNOSIS — J4520 Mild intermittent asthma, uncomplicated: Secondary | ICD-10-CM

## 2020-10-20 DIAGNOSIS — R000 Tachycardia, unspecified: Secondary | ICD-10-CM

## 2020-10-20 DIAGNOSIS — Z6841 Body Mass Index (BMI) 40.0 and over, adult: Secondary | ICD-10-CM

## 2020-10-20 LAB — LACTIC ACID, PLASMA: Lactic Acid, Venous: 2 mmol/L (ref 0.5–1.9)

## 2020-10-20 LAB — TSH: TSH: 1.661 u[IU]/mL (ref 0.350–4.500)

## 2020-10-20 LAB — HIV ANTIBODY (ROUTINE TESTING W REFLEX): HIV Screen 4th Generation wRfx: NONREACTIVE

## 2020-10-20 MED ORDER — GUAIFENESIN-DM 100-10 MG/5ML PO SYRP
10.0000 mL | ORAL_SOLUTION | ORAL | Status: DC | PRN
  Administered 2020-10-20 – 2020-10-22 (×6): 10 mL via ORAL
  Filled 2020-10-20 (×6): qty 10

## 2020-10-20 MED ORDER — MONTELUKAST SODIUM 10 MG PO TABS
10.0000 mg | ORAL_TABLET | Freq: Every day | ORAL | Status: DC
  Administered 2020-10-20 – 2020-10-22 (×3): 10 mg via ORAL
  Filled 2020-10-20 (×3): qty 1

## 2020-10-20 MED ORDER — ONDANSETRON HCL 4 MG/2ML IJ SOLN
4.0000 mg | Freq: Four times a day (QID) | INTRAMUSCULAR | Status: DC | PRN
  Filled 2020-10-20: qty 2

## 2020-10-20 MED ORDER — ALPRAZOLAM 0.25 MG PO TABS
0.2500 mg | ORAL_TABLET | Freq: Three times a day (TID) | ORAL | Status: DC | PRN
  Administered 2020-10-20 (×2): 0.25 mg via ORAL
  Filled 2020-10-20 (×2): qty 1

## 2020-10-20 MED ORDER — ALPRAZOLAM 0.25 MG PO TABS: 0.2500 mg | ORAL_TABLET | ORAL | Status: DC | PRN

## 2020-10-20 MED ORDER — METOPROLOL TARTRATE 25 MG PO TABS
12.5000 mg | ORAL_TABLET | Freq: Three times a day (TID) | ORAL | Status: DC
  Administered 2020-10-20: 12.5 mg via ORAL
  Filled 2020-10-20 (×3): qty 1

## 2020-10-20 MED ORDER — AMIODARONE HCL IN DEXTROSE 360-4.14 MG/200ML-% IV SOLN
30.0000 mg/h | INTRAVENOUS | Status: DC
Start: 2020-10-20 — End: 2020-10-21
  Administered 2020-10-20 – 2020-10-21 (×2): 30 mg/h via INTRAVENOUS
  Filled 2020-10-20 (×2): qty 200

## 2020-10-20 MED ORDER — AMIODARONE HCL IN DEXTROSE 360-4.14 MG/200ML-% IV SOLN
60.0000 mg/h | INTRAVENOUS | Status: AC
  Administered 2020-10-20: 60 mg/h via INTRAVENOUS
  Filled 2020-10-20: qty 200

## 2020-10-20 MED ORDER — MOMETASONE FURO-FORMOTEROL FUM 100-5 MCG/ACT IN AERO
2.0000 | INHALATION_SPRAY | Freq: Two times a day (BID) | RESPIRATORY_TRACT | Status: DC
  Administered 2020-10-20 – 2020-10-23 (×5): 2 via RESPIRATORY_TRACT
  Filled 2020-10-20: qty 8.8

## 2020-10-20 MED ORDER — AMIODARONE IV BOLUS ONLY 150 MG/100ML
150.0000 mg | Freq: Once | INTRAVENOUS | Status: AC
  Administered 2020-10-20: 150 mg via INTRAVENOUS
  Filled 2020-10-20: qty 100

## 2020-10-20 MED ORDER — SODIUM CHLORIDE 0.9 % IV SOLN: INTRAVENOUS | Status: DC

## 2020-10-20 MED ORDER — MONTELUKAST SODIUM 5 MG PO CHEW: 10.0000 mg | CHEWABLE_TABLET | Freq: Every day | ORAL | Status: DC

## 2020-10-20 MED ORDER — METOPROLOL TARTRATE 5 MG/5ML IV SOLN
5.0000 mg | INTRAVENOUS | Status: DC | PRN
  Administered 2020-10-20: 5 mg via INTRAVENOUS
  Filled 2020-10-20: qty 5

## 2020-10-20 MED ORDER — SODIUM CHLORIDE 0.9 % IV BOLUS
500.0000 mL | Freq: Once | INTRAVENOUS | Status: AC
  Administered 2020-10-20: 500 mL via INTRAVENOUS

## 2020-10-20 NOTE — Progress Notes (Signed)
Clarified orders with Dr. Para March on cartizem drip. Verbal orders to maintain HR 120-130s. If HR sustains 140s, restart cardizem drip at 2.5 with obtaining BP q15 minutes to monitor for hypotension after lightheadedness episode.   Also notified of lactic 2.0 with previous lactic at 1.2. Increased IVF to 125/hr. Continue to monitor.

## 2020-10-20 NOTE — Progress Notes (Signed)
Dr. York Ram at the bedside to assess pt. Pt is feeling lightheaded. HR in the mid to high 90s with SBP in mid 90s. Verbal orders to stop Cardizem gtt until patient's HR comes up to 120-130s. Maintain HR no greater than 130 per Dr. York Ram. Continue to monitor.

## 2020-10-20 NOTE — Progress Notes (Signed)
Patient is refusing to take metoprolol. She was given the one dose of IV metoprolol of 5 mg and it made her have a lightheaded spell. She is refusing anything that makes her heart rate slow down. Heart rate currently 130.

## 2020-10-20 NOTE — Consult Note (Signed)
CARDIOLOGY CONSULT NOTE               Patient ID: Nicole Dillon MRN: [id number] DOB/AGE: November 30, 1995 24 y.o.  Admit date: 10/19/2020 Referring Physician [facility] hospitalist Primary Physician [facility] Primary Cardiologist none Reason for Consultation acute on chronic tachycardia pneumonia, probable atrial flutter cannot exclude sinus tach  HPI: Patient is a 25 year old female with asthma morbid obesity irregular heartbeat and chronic tachycardia patient's had evaluations in the past with heart rates averaging around 145 which and as high as 180 she has had ER visits in the past treated with metoprolol presented to the emergency room with wheezing shortness of breath congestion cough seasonal allergies for about a week but found to have probable pneumonia treated with antibiotic therapy blood pressure has been reasonable white count of 13 not much in the way of fever CT of the chest showed no evidence of PE EKG had narrow complex tachycardia thought to be sinus tach but atrial flutter could not be excluded in my opinion was some concern that she had episodes of atrial fibrillation patient was treated with diltiazem without significant improvement and subsequently placed on amiodarone drip.  Patient was treated with Rocephin and Zithromax for pneumonia patient heart rate hovered around 1 20-1 30 at the time I saw the patient and she seems reasonably comfortable in bed.   Review of systems complete and found to be negative unless listed above     Past Medical History:  Diagnosis Date  . 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activity   . Asthma   . Insomnia   . Irregular heart beat   . Seasonal allergies     Past Surgical History:  Procedure Laterality Date  . TOOTH EXTRACTION      Medications Prior to Admission  Medication Sig Dispense Refill Last Dose  . guaiFENesin (MUCINEX) 600 MG 12 hr tablet Take 600 mg by mouth 2 (two) times daily.   Past Week at Unknown  time   Social History   Socioeconomic History  . Marital status: Single    Spouse name: Not on file  . Number of children: Not on file  . Years of education: Not on file  . Highest education level: Not on file  Occupational History  . Not on file  Tobacco Use  . Smoking status: Light Tobacco Smoker    Last attempt to quit: 03/10/2014    Years since quitting: 6.6  . Smokeless tobacco: Never Used  Substance and Sexual Activity  . Alcohol use: No  . Drug use: No  . Sexual activity: Yes    Birth control/protection: Condom  Other Topics Concern  . Not on file  Social History Narrative  . Not on file   Social Determinants of Health   Financial Resource Strain: Not on file  Food Insecurity: Not on file  Transportation Needs: Not on file  Physical Activity: Not on file  Stress: Not on file  Social Connections: Not on file  Intimate Partner Violence: Not on file    Family History  Problem Relation Age of Onset  . Diabetes Mother   . Diabetes Father   . Kidney disease Father        dialysis  . Learning disabilities Maternal Uncle        speach impediment  . Heart disease Maternal Grandmother   . Heart disease Maternal Grandfather       Review of systems complete and found to be negative unless listed above  PHYSICAL EXAM  General: Well developed, well nourished, in no acute distress HEENT:  Normocephalic and atramatic Neck:  No JVD.  Lungs: Clear bilaterally to auscultation and percussion. Heart: Tachycardia regular. Normal S1 and S2 without gallops or murmurs.  Abdomen: Bowel sounds are positive, abdomen soft and non-tender  Msk:  Back normal, normal gait. Normal strength and tone for age. Extremities: No clubbing, cyanosis or edema.   Neuro: Alert and oriented X 3. Psych:  Good affect, responds appropriately  Labs:   Lab Results  Component Value Date   WBC 12.9 (H) 10/19/2020   HGB 12.5 10/19/2020   HCT 37.8 10/19/2020   MCV 90.0 10/19/2020   PLT  412 (H) 10/19/2020    Recent Labs  Lab 10/19/20 1704  NA 136  K 3.9  CL 107  CO2 21*  BUN 10  CREATININE 0.86  CALCIUM 8.4*  GLUCOSE 110*   No results found for: CKTOTAL, CKMB, CKMBINDEX, TROPONINI No results found for: CHOL No results found for: HDL No results found for: LDLCALC No results found for: TRIG No results found for: CHOLHDL No results found for: LDLDIRECT    Radiology: CT Angio Chest PE W and/or Wo Contrast  Result Date: 10/19/2020 CLINICAL DATA:  Difficulty breathing. EXAM: CT ANGIOGRAPHY CHEST WITH CONTRAST TECHNIQUE: Multidetector CT imaging of the chest was performed using the standard protocol during bolus administration of intravenous contrast. Multiplanar CT image reconstructions and MIPs were obtained to evaluate the vascular anatomy. CONTRAST:  59mL OMNIPAQUE IOHEXOL 350 MG/ML SOLN COMPARISON:  None. FINDINGS: Cardiovascular: Satisfactory opacification of the pulmonary arteries to the segmental level. No evidence of pulmonary embolism. Normal heart size. No pericardial effusion. Mediastinum/Nodes: No enlarged mediastinal, hilar, or axillary lymph nodes. Thyroid gland, trachea, and esophagus demonstrate no significant findings. Lungs/Pleura: A 1.0 cm noncalcified lung nodule is seen within the anterolateral aspect of the right apex (axial CT image 18, CT series number 6). Moderate severity patchy infiltrates are seen within the bilateral lower lobes. Small bilateral pleural effusions are noted. No pneumothorax is identified. Upper Abdomen: No acute abnormality. Musculoskeletal: No chest wall abnormality. No acute or significant osseous findings. Review of the MIP images confirms the above findings. IMPRESSION: 1. Moderate severity bilateral lower lobe infiltrates. 2. Small bilateral pleural effusions. 3. 1.0 cm noncalcified right apical lung nodule. Consider one of the following in 3 months for both low-risk and high-risk individuals: (a) repeat chest CT, (b) follow-up  PET-CT, or (c) tissue sampling. This recommendation follows the consensus statement: Guidelines for Management of Incidental Pulmonary Nodules Detected on CT Images: From the Fleischner Society 2017; Radiology 2017; 284:228-243. Electronically Signed   By: Aram Candela M.D.   On: 10/19/2020 20:47   DG Chest Portable 1 View  Result Date: 10/19/2020 CLINICAL DATA:  Shortness of breath EXAM: PORTABLE CHEST 1 VIEW COMPARISON:  02/12/2020 FINDINGS: Mild cardiomegaly. No focal opacity or pleural effusion. No pneumothorax IMPRESSION: No active disease.  Mild cardiomegaly Electronically Signed   By: Jasmine Pang M.D.   On: 10/19/2020 17:53    EKG: Narrow complex tachycardia rate of around 130 possible sinus tach but most likely atrial flutter  ASSESSMENT AND PLAN:  Pneumonia Tachycardia Probable atrial flutter/atrial fib Cannot completely rule out sinus tach Morbid obesity Chronic tachycardia  Plan Continue supportive therapy on telemetry follow-up EKGs Maintain adequate hydration Continue antibiotic therapy to treat underlying infection and add supplemental oxygen for hypoxemia Inhaler therapy will worsen tachycardia but may need to have an if necessary Continue cough medications  and antihistamine for symptom control Will try to maintain amiodarone drip for now to see if that helps a slow heart rate down to convert from atrial flutter Will probably add low-dose metoprolol later today if heart rate persist Since patient is reasonably stable and young I am preferring to wait before adding additional medications Await echocardiogram and BNP for further assessment Obstructive sleep apnea should be investigated and probably treated if necessary Weight loss is crucial overall for this patient to help her health I recommend a conservative approach for her heart rate and treat underlying infection    Signed: Alwyn Pea MD 10/20/2020, 7:35 AM

## 2020-10-20 NOTE — Progress Notes (Signed)
Patient ID: Nicole Dillon, female   DOB: 18-Mar-1996, 25 y.o.   MRN: [id number]  PROGRESS NOTE    Khylee Algeo  OYD:[id number] DOB: 08/15/1995 DOA: 10/19/2020 [facility]CP: [facility]   Brief Narrative:  25 year old female with history of asthma, morbid obesity, irregular heart rate since solid, chronic tachycardia as far back as 2017 when patient had Holter monitor with findings of sinus tachycardia with PVCs presented with worsening cough, wheezing, shortness of breath.  On presentation, she had a heart rate of 185.  Blood work revealed WBCs of 13,000 with D-dimer 3.8, BNP of 381.  Several EKGs were done in the ED and she was given Cardizem bolus with Cardizem drip because of persistent tachycardia.  CTA chest was negative for PE but showed moderate severity bilateral lower lobe infiltrates.  She was started on Rocephin and Zithromax.  Assessment & Plan:   Bilateral community-acquired pneumonia History of asthma/seasonal allergies -CTA chest was negative for PE but showed moderate severity bilateral lower lobe infiltrates -Continue Rocephin and Zithromax.  Currently on room air.  Oxygen supplementation as needed. -Antihistamines as needed.  Currently not wheezing.  Will not need steroids. -Xopenex as needed for wheezing.  Acute on chronic tachyarrhythmia;?  A. Fib -- Heart rate in the ER in the 130s to 180s, with prior Holter showing average heart rate of 145 back in 2017 - EKGs initially showed sinus up to 185 but following diltiazem bolus showed A. fib at 135 -Initially started on Cardizem drip but because of drop in blood pressure and persistent tachycardia, she was switched to amiodarone drip.  Cardiology consult.  Leukocytosis -Monitor  Morbid obesity -Outpatient follow-up.  DVT prophylaxis: Lovenox Code Status: Full Family Communication: None at bedside Disposition Plan: Status is: Inpatient  Remains inpatient appropriate because:Inpatient level of care  appropriate due to severity of illness   Dispo: The patient is from: Home              Anticipated d/c is to: Home              Patient currently is not medically stable to d/c.   Difficult to place patient No  Consultants: Cardiology  Procedures: None  Antimicrobials: Rocephin and Zithromax from 10/19/2020 onwards   Subjective: Patient seen and examined at bedside.  Complains of cough and shortness of breath but improving.  Feels anxious.  No overnight fever or vomiting reported.  Denies any chest pain.  Objective: Vitals:   10/20/20 0630 10/20/20 0645 10/20/20 0700 10/20/20 0820  BP: (!) 126/94 125/67 100/88 (!) 143/107  Pulse: 66  (!) 128   Resp: (!) 24 20  (!) 24  Temp:    98 F (36.7 C)  TempSrc:    Oral  SpO2: 100% 92%    Weight:   (!) 166.8 kg   Height:        Intake/Output Summary (Last 24 hours) at 10/20/2020 1025 Last data filed at 10/20/2020 1009 Gross per 24 hour  Intake 2857.27 ml  Output 650 ml  Net 2207.27 ml   Filed Weights   10/19/20 1707 10/20/20 0700  Weight: (!) 158.8 kg (!) 166.8 kg    Examination:  General exam: Appears calm and comfortable.  Looks older than stated age.  Currently on room air. Respiratory system: Bilateral decreased breath sounds at bases with scattered crackles Cardiovascular system: S1 & S2 heard, Rate controlled Gastrointestinal system: Abdomen is morbidly obese, nondistended, soft and nontender. Normal bowel sounds heard. Extremities: No cyanosis, clubbing,  edema  Central nervous system: Alert and oriented. No focal neurological deficits. Moving extremities Skin: No rashes, lesions or ulcers Psychiatry: Judgement and insight appear normal. Mood & affect appropriate.  Looks intermittently anxious.    Data Reviewed: I have personally reviewed following labs and imaging studies  CBC: Recent Labs  Lab 10/19/20 1704  WBC 12.9*  HGB 12.5  HCT 37.8  MCV 90.0  PLT 412*   Basic Metabolic Panel: Recent Labs  Lab  10/19/20 1704  NA 136  K 3.9  CL 107  CO2 21*  GLUCOSE 110*  BUN 10  CREATININE 0.86  CALCIUM 8.4*   GFR: Estimated Creatinine Clearance: 161.5 mL/min (by C-G formula based on SCr of 0.86 mg/dL). Liver Function Tests: No results for input(s): AST, ALT, ALKPHOS, BILITOT, PROT, ALBUMIN in the last 168 hours. No results for input(s): LIPASE, AMYLASE in the last 168 hours. No results for input(s): AMMONIA in the last 168 hours. Coagulation Profile: No results for input(s): INR, PROTIME in the last 168 hours. Cardiac Enzymes: No results for input(s): CKTOTAL, CKMB, CKMBINDEX, TROPONINI in the last 168 hours. BNP (last 3 results) No results for input(s): PROBNP in the last 8760 hours. HbA1C: No results for input(s): HGBA1C in the last 72 hours. CBG: No results for input(s): GLUCAP in the last 168 hours. Lipid Profile: No results for input(s): CHOL, HDL, LDLCALC, TRIG, CHOLHDL, LDLDIRECT in the last 72 hours. Thyroid Function Tests: Recent Labs    10/20/20 0112  TSH 1.661   Anemia Panel: No results for input(s): VITAMINB12, FOLATE, FERRITIN, TIBC, IRON, RETICCTPCT in the last 72 hours. Sepsis Labs: Recent Labs  Lab 10/19/20 2110 10/20/20 0112  LATICACIDVEN 1.2 2.0*    Recent Results (from the past 240 hour(s))  Resp Panel by RT-PCR (Flu A&B, Covid) Nasopharyngeal Swab     Status: None   Collection Time: 10/19/20  5:30 PM   Specimen: Nasopharyngeal Swab; Nasopharyngeal(NP) swabs in vial transport medium  Result Value Ref Range Status   SARS Coronavirus 2 by RT PCR NEGATIVE NEGATIVE Final    Comment: (NOTE) SARS-CoV-2 target nucleic acids are NOT DETECTED.  The SARS-CoV-2 RNA is generally detectable in upper respiratory specimens during the acute phase of infection. The lowest concentration of SARS-CoV-2 viral copies this assay can detect is 138 copies/mL. A negative result does not preclude SARS-Cov-2 infection and should not be used as the sole basis for treatment  or other patient management decisions. A negative result may occur with  improper specimen collection/handling, submission of specimen other than nasopharyngeal swab, presence of viral mutation(s) within the areas targeted by this assay, and inadequate number of viral copies(<138 copies/mL). A negative result must be combined with clinical observations, patient history, and epidemiological information. The expected result is Negative.  Fact Sheet for Patients:  BloggerCourse.comhttps://www.fda.gov/media/152166/download  Fact Sheet for Healthcare Providers:  SeriousBroker.ithttps://www.fda.gov/media/152162/download  This test is no t yet approved or cleared by the [place] FDA and  has been authorized for detection and/or diagnosis of SARS-CoV-2 by FDA under an Emergency Use Authorization (EUA). This EUA will remain  in effect (meaning this test can be used) for the duration of the COVID-19 declaration under Section 564(b)(1) of the Act, 21 U.S.C.section 360bbb-3(b)(1), unless the authorization is terminated  or revoked sooner.       Influenza A by PCR NEGATIVE NEGATIVE Final   Influenza B by PCR NEGATIVE NEGATIVE Final    Comment: (NOTE) The Xpert Xpress SARS-CoV-2/FLU/RSV plus assay is intended as an aid in  the diagnosis of influenza from Nasopharyngeal swab specimens and should not be used as a sole basis for treatment. Nasal washings and aspirates are unacceptable for Xpert Xpress SARS-CoV-2/FLU/RSV testing.  Fact Sheet for Patients: BloggerCourse.com  Fact Sheet for Healthcare Providers: SeriousBroker.it  This test is not yet approved or cleared by the [place] FDA and has been authorized for detection and/or diagnosis of SARS-CoV-2 by FDA under an Emergency Use Authorization (EUA). This EUA will remain in effect (meaning this test can be used) for the duration of the COVID-19 declaration under Section 564(b)(1) of the Act, 21 U.S.C. section  360bbb-3(b)(1), unless the authorization is terminated or revoked.  Performed at [facility], [address] [place], [place] [address]   Culture, blood (routine x 2)     Status: None (Preliminary result)   Collection Time: 10/19/20  9:05 PM   Specimen: BLOOD  Result Value Ref Range Status   Specimen Description BLOOD BLOOD LEFT FOREARM  Final   Special Requests   Final    BOTTLES DRAWN AEROBIC AND ANAEROBIC Blood Culture adequate volume   Culture   Final    NO GROWTH < 12 HOURS Performed at [facility], [address] [place], [place] [address]    Report Status PENDING  Incomplete  Culture, blood (routine x 2)     Status: None (Preliminary result)   Collection Time: 10/19/20  9:10 PM   Specimen: BLOOD  Result Value Ref Range Status   Specimen Description BLOOD RIGHT ANTECUBITAL  Final   Special Requests   Final    BOTTLES DRAWN AEROBIC AND ANAEROBIC Blood Culture adequate volume   Culture   Final    NO GROWTH < 12 HOURS Performed at [facility], [address], [place], [place] [address]    Report Status PENDING  Incomplete         Radiology Studies: CT Angio Chest PE W and/or Wo Contrast  Result Date: 10/19/2020 CLINICAL DATA:  Difficulty breathing. EXAM: CT ANGIOGRAPHY CHEST WITH CONTRAST TECHNIQUE: Multidetector CT imaging of the chest was performed using the standard protocol during bolus administration of intravenous contrast. Multiplanar CT image reconstructions and MIPs were obtained to evaluate the vascular anatomy. CONTRAST:  21mL OMNIPAQUE IOHEXOL 350 MG/ML SOLN COMPARISON:  None. FINDINGS: Cardiovascular: Satisfactory opacification of the pulmonary arteries to the segmental level. No evidence of pulmonary embolism. Normal heart size. No pericardial effusion. Mediastinum/Nodes: No enlarged mediastinal, hilar, or axillary lymph nodes. Thyroid gland, trachea, and esophagus demonstrate no significant findings. Lungs/Pleura: A 1.0 cm  noncalcified lung nodule is seen within the anterolateral aspect of the right apex (axial CT image 18, CT series number 6). Moderate severity patchy infiltrates are seen within the bilateral lower lobes. Small bilateral pleural effusions are noted. No pneumothorax is identified. Upper Abdomen: No acute abnormality. Musculoskeletal: No chest wall abnormality. No acute or significant osseous findings. Review of the MIP images confirms the above findings. IMPRESSION: 1. Moderate severity bilateral lower lobe infiltrates. 2. Small bilateral pleural effusions. 3. 1.0 cm noncalcified right apical lung nodule. Consider one of the following in 3 months for both low-risk and high-risk individuals: (a) repeat chest CT, (b) follow-up PET-CT, or (c) tissue sampling. This recommendation follows the consensus statement: Guidelines for Management of Incidental Pulmonary Nodules Detected on CT Images: From the Fleischner Society 2017; Radiology 2017; 284:228-243. Electronically Signed   By: Aram Candela M.D.   On: 10/19/2020 20:47   DG Chest Portable 1 View  Result Date: 10/19/2020  CLINICAL DATA:  Shortness of breath EXAM: PORTABLE CHEST 1 VIEW COMPARISON:  02/12/2020 FINDINGS: Mild cardiomegaly. No focal opacity or pleural effusion. No pneumothorax IMPRESSION: No active disease.  Mild cardiomegaly Electronically Signed   By: Jasmine Pang M.D.   On: 10/19/2020 17:53        Scheduled Meds: . enoxaparin (LOVENOX) injection  0.5 mg/kg Subcutaneous Q24H   Continuous Infusions: . sodium chloride 75 mL/hr at 10/20/20 0844  . amiodarone 60 mg/hr (10/20/20 0644)  . amiodarone    . azithromycin Stopped (10/20/20 0019)  . cefTRIAXone (ROCEPHIN)  IV    . diltiazem (CARDIZEM) infusion Stopped (10/20/20 0618)          Glade Lloyd, MD [facility] 10/20/2020, 10:25 AM

## 2020-10-20 NOTE — Progress Notes (Signed)
Pt states feels like she is going to pass out, pt awake and alert, pt layed flat, cool wash cloth provided, blanket removed, and vital signs obtained. After 5 min pt states feels a little better and raises head of bed to 30 degrees on own. Primary RN to bedside.

## 2020-10-21 ENCOUNTER — Inpatient Hospital Stay
Admit: 2020-10-21 | Discharge: 2020-10-21 | Disposition: A | Discharge status: Home / Self Care | Payer: Self-pay | Attending: Internal Medicine | Admitting: Internal Medicine

## 2020-10-21 DIAGNOSIS — N179 Acute kidney failure, unspecified: Secondary | ICD-10-CM

## 2020-10-21 DIAGNOSIS — I4892 Unspecified atrial flutter: Secondary | ICD-10-CM

## 2020-10-21 LAB — CBC WITH DIFFERENTIAL/PLATELET
Abs Immature Granulocytes: 0.15 10*3/uL — ABNORMAL HIGH (ref 0.00–0.07)
Basophils Absolute: 0 10*3/uL (ref 0.0–0.1)
Basophils Relative: 0 %
Eosinophils Absolute: 0 10*3/uL (ref 0.0–0.5)
Eosinophils Relative: 0 %
HCT: 38 % (ref 36.0–46.0)
Hemoglobin: 12.9 g/dL (ref 12.0–15.0)
Immature Granulocytes: 1 %
Lymphocytes Relative: 12 %
Lymphs Abs: 2.3 10*3/uL (ref 0.7–4.0)
MCH: 30.4 pg (ref 26.0–34.0)
MCHC: 33.9 g/dL (ref 30.0–36.0)
MCV: 89.4 fL (ref 80.0–100.0)
Monocytes Absolute: 0.6 10*3/uL (ref 0.1–1.0)
Monocytes Relative: 3 %
Neutro Abs: 15.8 10*3/uL — ABNORMAL HIGH (ref 1.7–7.7)
Neutrophils Relative %: 84 %
Platelets: 416 10*3/uL — ABNORMAL HIGH (ref 150–400)
RBC: 4.25 MIL/uL (ref 3.87–5.11)
RDW: 14.6 % (ref 11.5–15.5)
WBC: 18.9 10*3/uL — ABNORMAL HIGH (ref 4.0–10.5)
nRBC: 0 % (ref 0.0–0.2)

## 2020-10-21 LAB — BASIC METABOLIC PANEL
Anion gap: 9 (ref 5–15)
BUN: 13 mg/dL (ref 6–20)
CO2: 17 mmol/L — ABNORMAL LOW (ref 22–32)
Calcium: 8.2 mg/dL — ABNORMAL LOW (ref 8.9–10.3)
Chloride: 107 mmol/L (ref 98–111)
Creatinine, Ser: 1.28 mg/dL — ABNORMAL HIGH (ref 0.44–1.00)
GFR, Estimated: 60 mL/min — ABNORMAL LOW (ref >60)
Glucose, Bld: 143 mg/dL — ABNORMAL HIGH (ref 70–99)
Potassium: 4.4 mmol/L (ref 3.5–5.1)
Sodium: 133 mmol/L — ABNORMAL LOW (ref 135–145)

## 2020-10-21 LAB — ECHOCARDIOGRAM COMPLETE
AR max vel: 2.03 cm2
AV Area VTI: 2.22 cm2
AV Area mean vel: 2.09 cm2
AV Mean grad: 2 mmHg
AV Peak grad: 4 mmHg
Ao pk vel: 1 m/s
Area-P 1/2: 15.8 cm2
Height: 66 in
S' Lateral: 5.39 cm
Single Plane A4C EF: 8.4 %
Weight: 5929.6 oz

## 2020-10-21 LAB — MRSA PCR SCREENING: MRSA by PCR: NEGATIVE

## 2020-10-21 LAB — MAGNESIUM: Magnesium: 1.7 mg/dL (ref 1.7–2.4)

## 2020-10-21 MED ORDER — MAGNESIUM SULFATE 2 GM/50ML IV SOLN
2.0000 g | Freq: Once | INTRAVENOUS | Status: AC
  Administered 2020-10-21: 2 g via INTRAVENOUS
  Filled 2020-10-21: qty 50

## 2020-10-21 MED ORDER — AMIODARONE HCL 200 MG PO TABS
200.0000 mg | ORAL_TABLET | Freq: Two times a day (BID) | ORAL | Status: DC
  Administered 2020-10-21 – 2020-10-23 (×5): 200 mg via ORAL
  Filled 2020-10-21 (×6): qty 1

## 2020-10-21 MED ORDER — DIGOXIN 250 MCG PO TABS
0.2500 mg | ORAL_TABLET | Freq: Four times a day (QID) | ORAL | Status: AC
  Administered 2020-10-21 – 2020-10-22 (×4): 0.25 mg via ORAL
  Filled 2020-10-21 (×5): qty 1

## 2020-10-21 MED ORDER — DIGOXIN 125 MCG PO TABS: 0.1250 mg | ORAL_TABLET | Freq: Every day | ORAL | Status: DC

## 2020-10-21 NOTE — Progress Notes (Signed)
*  PRELIMINARY RESULTS* Echocardiogram 2D Echocardiogram has been performed.  Nicole Dillon 10/21/2020, 1:52 PM

## 2020-10-21 NOTE — Progress Notes (Signed)
PROGRESS NOTE    Nicole Dillon  XVQ:[id number] DOB: [birth year] DOA: 10/19/2020 PCP: [facility]    Assessment & Plan:   Principal Problem:   Bilateral pneumonia Active Problems:   Morbid obesity with BMI of 50.0-59.9, adult (HCC)   Asthma, mild intermittent   Tachyarrhythmia   CAP: b/l infiltrates as per CTA chest. Continue on IV rocephin, azithromycin, bronchodilators and encourage incentive spirometry   Likely a. fib vs a. flutter: continue on IV amio drip & continue on po metoprolol. Echo pending. Continue on tele. Cardio following and recs apprec  Morbid obesity: BMI 59.3. Complicates overall care and prognosis   Leukocytosis: likely secondary to infection. Continue on IV abxs  Thrombocytosis: etiology unclear, likely reactive. Will continue to monitor   AKI: baseline Cr around 0.86. Avoid nephrotoxic meds    DVT prophylaxis: lovenox  Code Status: full  Family Communication:  Disposition Plan: likely d/c back home.  Level of care: Progressive Cardiac   Status is: Inpatient  Remains inpatient appropriate because:IV treatments appropriate due to intensity of illness or inability to take PO and Inpatient level of care appropriate due to severity of illness   Dispo: The patient is from: Home              Anticipated d/c is to: Home              Patient currently is not medically stable to d/c.   Difficult to place patient: unclear      Consultants:      Procedures:    Antimicrobials: ceftriaxone, azithromycin    Subjective: Pt c/o diarrhea and fatigue   Objective: Vitals:   10/20/20 2227 10/21/20 0500 10/21/20 0719 10/21/20 0724  BP: 109/72 (!) 122/98 (!) 155/121 (!) 117/56  Pulse: (!) 131 (!) 123 (!) 55   Resp:  (!) 23 19   Temp:  98.3 F (36.8 C) 98.5 F (36.9 C)   TempSrc:   Oral   SpO2: 93% 93% 95%   Weight:      Height:        Intake/Output Summary (Last 24 hours) at 10/21/2020 0758 Last data filed at 10/21/2020  0659 Gross per 24 hour  Intake 905.48 ml  Output 850 ml  Net 55.48 ml   Filed Weights   10/19/20 1707 10/20/20 0700  Weight: (!) 158.8 kg (!) 166.8 kg    Examination:  General exam: Appears calm and comfortable. Morbidly obese Respiratory system: diminished breath sounds b/l  Cardiovascular system: S1 & S2 +. No rubs, gallops or clicks.  Gastrointestinal system: Abd is soft, NT, obese & hyperactive bowel sounds  Central nervous system: Alert and oriented. Moves all 4 extremities  Psychiatry: Judgement and insight appear normal. Mood & affect appropriate.     Data Reviewed: I have personally reviewed following labs and imaging studies  CBC: Recent Labs  Lab 10/19/20 1704 10/21/20 0331  WBC 12.9* 18.9*  NEUTROABS  --  15.8*  HGB 12.5 12.9  HCT 37.8 38.0  MCV 90.0 89.4  PLT 412* 416*   Basic Metabolic Panel: Recent Labs  Lab 10/19/20 1704 10/21/20 0331  NA 136 133*  K 3.9 4.4  CL 107 107  CO2 21* 17*  GLUCOSE 110* 143*  BUN 10 13  CREATININE 0.86 1.28*  CALCIUM 8.4* 8.2*  MG  --  1.7   GFR: Estimated Creatinine Clearance: 108.5 mL/min (A) (by C-G formula based on SCr of 1.28 mg/dL (H)). Liver Function Tests: No results for  input(s): AST, ALT, ALKPHOS, BILITOT, PROT, ALBUMIN in the last 168 hours. No results for input(s): LIPASE, AMYLASE in the last 168 hours. No results for input(s): AMMONIA in the last 168 hours. Coagulation Profile: No results for input(s): INR, PROTIME in the last 168 hours. Cardiac Enzymes: No results for input(s): CKTOTAL, CKMB, CKMBINDEX, TROPONINI in the last 168 hours. BNP (last 3 results) No results for input(s): PROBNP in the last 8760 hours. HbA1C: No results for input(s): HGBA1C in the last 72 hours. CBG: No results for input(s): GLUCAP in the last 168 hours. Lipid Profile: No results for input(s): CHOL, HDL, LDLCALC, TRIG, CHOLHDL, LDLDIRECT in the last 72 hours. Thyroid Function Tests: Recent Labs    10/20/20 0112   TSH 1.661   Anemia Panel: No results for input(s): VITAMINB12, FOLATE, FERRITIN, TIBC, IRON, RETICCTPCT in the last 72 hours. Sepsis Labs: Recent Labs  Lab 10/19/20 2110 10/20/20 0112  LATICACIDVEN 1.2 2.0*    Recent Results (from the past 240 hour(s))  Resp Panel by RT-PCR (Flu A&B, Covid) Nasopharyngeal Swab     Status: None   Collection Time: 10/19/20  5:30 PM   Specimen: Nasopharyngeal Swab; Nasopharyngeal(NP) swabs in vial transport medium  Result Value Ref Range Status   SARS Coronavirus 2 by RT PCR NEGATIVE NEGATIVE Final    Comment: (NOTE) SARS-CoV-2 target nucleic acids are NOT DETECTED.  The SARS-CoV-2 RNA is generally detectable in upper respiratory specimens during the acute phase of infection. The lowest concentration of SARS-CoV-2 viral copies this assay can detect is 138 copies/mL. A negative result does not preclude SARS-Cov-2 infection and should not be used as the sole basis for treatment or other patient management decisions. A negative result may occur with  improper specimen collection/handling, submission of specimen other than nasopharyngeal swab, presence of viral mutation(s) within the areas targeted by this assay, and inadequate number of viral copies(<138 copies/mL). A negative result must be combined with clinical observations, patient history, and epidemiological information. The expected result is Negative.  Fact Sheet for Patients:  BloggerCourse.comhttps://www.fda.gov/media/152166/download  Fact Sheet for Healthcare Providers:  SeriousBroker.ithttps://www.fda.gov/media/152162/download  This test is no t yet approved or cleared by the [place] FDA and  has been authorized for detection and/or diagnosis of SARS-CoV-2 by FDA under an Emergency Use Authorization (EUA). This EUA will remain  in effect (meaning this test can be used) for the duration of the COVID-19 declaration under Section 564(b)(1) of the Act, 21 U.S.C.section 360bbb-3(b)(1), unless the authorization  is terminated  or revoked sooner.       Influenza A by PCR NEGATIVE NEGATIVE Final   Influenza B by PCR NEGATIVE NEGATIVE Final    Comment: (NOTE) The Xpert Xpress SARS-CoV-2/FLU/RSV plus assay is intended as an aid in the diagnosis of influenza from Nasopharyngeal swab specimens and should not be used as a sole basis for treatment. Nasal washings and aspirates are unacceptable for Xpert Xpress SARS-CoV-2/FLU/RSV testing.  Fact Sheet for Patients: BloggerCourse.comhttps://www.fda.gov/media/152166/download  Fact Sheet for Healthcare Providers: SeriousBroker.ithttps://www.fda.gov/media/152162/download  This test is not yet approved or cleared by the [place] FDA and has been authorized for detection and/or diagnosis of SARS-CoV-2 by FDA under an Emergency Use Authorization (EUA). This EUA will remain in effect (meaning this test can be used) for the duration of the COVID-19 declaration under Section 564(b)(1) of the Act, 21 U.S.C. section 360bbb-3(b)(1), unless the authorization is terminated or revoked.  Performed at [facility], [address] [place], [place] [address]   Culture, blood (routine  x 2)     Status: None (Preliminary result)   Collection Time: 10/19/20  9:05 PM   Specimen: BLOOD  Result Value Ref Range Status   Specimen Description BLOOD BLOOD LEFT FOREARM  Final   Special Requests   Final    BOTTLES DRAWN AEROBIC AND ANAEROBIC Blood Culture adequate volume   Culture   Final    NO GROWTH 2 DAYS Performed at [facility], [address], [place], [place] [address]    Report Status PENDING  Incomplete  Culture, blood (routine x 2)     Status: None (Preliminary result)   Collection Time: 10/19/20  9:10 PM   Specimen: BLOOD  Result Value Ref Range Status   Specimen Description BLOOD RIGHT ANTECUBITAL  Final   Special Requests   Final    BOTTLES DRAWN AEROBIC AND ANAEROBIC Blood Culture adequate volume   Culture   Final    NO GROWTH 2 DAYS Performed at [facility], [address] [place], [place] [address]    Report Status PENDING  Incomplete         Radiology Studies: CT Angio Chest PE W and/or Wo Contrast  Result Date: 10/19/2020 CLINICAL DATA:  Difficulty breathing. EXAM: CT ANGIOGRAPHY CHEST WITH CONTRAST TECHNIQUE: Multidetector CT imaging of the chest was performed using the standard protocol during bolus administration of intravenous contrast. Multiplanar CT image reconstructions and MIPs were obtained to evaluate the vascular anatomy. CONTRAST:  78mL OMNIPAQUE IOHEXOL 350 MG/ML SOLN COMPARISON:  None. FINDINGS: Cardiovascular: Satisfactory opacification of the pulmonary arteries to the segmental level. No evidence of pulmonary embolism. Normal heart size. No pericardial effusion. Mediastinum/Nodes: No enlarged mediastinal, hilar, or axillary lymph nodes. Thyroid gland, trachea, and esophagus demonstrate no significant findings. Lungs/Pleura: A 1.0 cm noncalcified lung nodule is seen within the anterolateral aspect of the right apex (axial CT image 18, CT series number 6). Moderate severity patchy infiltrates are seen within the bilateral lower lobes. Small bilateral pleural effusions are noted. No pneumothorax is identified. Upper Abdomen: No acute abnormality. Musculoskeletal: No chest wall abnormality. No acute or significant osseous findings. Review of the MIP images confirms the above findings. IMPRESSION: 1. Moderate severity bilateral lower lobe infiltrates. 2. Small bilateral pleural effusions. 3. 1.0 cm noncalcified right apical lung nodule. Consider one of the following in 3 months for both low-risk and high-risk individuals: (a) repeat chest CT, (b) follow-up PET-CT, or (c) tissue sampling. This recommendation follows the consensus statement: Guidelines for Management of Incidental Pulmonary Nodules Detected on CT Images: From the Fleischner Society 2017; Radiology 2017; 284:228-243. Electronically Signed   By: Aram Candela  M.D.   On: 10/19/2020 20:47   DG Chest Portable 1 View  Result Date: 10/19/2020 CLINICAL DATA:  Shortness of breath EXAM: PORTABLE CHEST 1 VIEW COMPARISON:  02/12/2020 FINDINGS: Mild cardiomegaly. No focal opacity or pleural effusion. No pneumothorax IMPRESSION: No active disease.  Mild cardiomegaly Electronically Signed   By: Jasmine Pang M.D.   On: 10/19/2020 17:53        Scheduled Meds: . enoxaparin (LOVENOX) injection  0.5 mg/kg Subcutaneous Q24H  . metoprolol tartrate  12.5 mg Oral TID  . mometasone-formoterol  2 puff Inhalation BID  . montelukast  10 mg Oral QHS   Continuous Infusions: . amiodarone 30 mg/hr (10/21/20 0030)  . azithromycin 500 mg (10/20/20 2200)  . cefTRIAXone (ROCEPHIN)  IV 2 g (10/20/20 2119)     LOS: 2 days    Time spent: 33 mins  Charise Killian, MD [facility] Pager [phone]  If 7PM-7AM, please contact night-coverage 10/21/2020, 7:58 AM

## 2020-10-21 NOTE — Progress Notes (Signed)
[facility] Cardiology    SUBJECTIVE: Patient states she feels somewhat better her heart rate is about 1 20-125 where she feels most comfortable she does not like her heart rate in the 90s she states it makes her feel funny.  Patient still has somewhat of a cough congestion is improved denies any fever feels is close to normal which she is felt in quite a while.  Refuses to take any further metoprolol only wants to take amiodarone for now   Vitals:   10/20/20 2227 10/21/20 0500 10/21/20 0719 10/21/20 0724  BP: 109/72 (!) 122/98 (!) 155/121 (!) 117/56  Pulse: (!) 131 (!) 123 (!) 55   Resp:  (!) 23 19   Temp:  98.3 F (36.8 C) 98.5 F (36.9 C)   TempSrc:   Oral   SpO2: 93% 93% 95%   Weight:      Height:         Intake/Output Summary (Last 24 hours) at 10/21/2020 3007 Last data filed at 10/21/2020 6226 Gross per 24 hour  Intake 905.48 ml  Output 850 ml  Net 55.48 ml      PHYSICAL EXAM  General: Well developed, well nourished, in no acute distress HEENT:  Normocephalic and atramatic Neck:  No JVD.  Lungs: Clear bilaterally to auscultation and percussion. Heart: HRRR . Normal S1 and S2 without gallops or murmurs.  Abdomen: Bowel sounds are positive, abdomen soft and non-tender  Msk:  Back normal, normal gait. Normal strength and tone for age. Extremities: No clubbing, cyanosis or edema.   Neuro: Alert and oriented X 3. Psych:  Good affect, responds appropriately   LABS: Basic Metabolic Panel: Recent Labs    10/19/20 1704 10/21/20 0331  NA 136 133*  K 3.9 4.4  CL 107 107  CO2 21* 17*  GLUCOSE 110* 143*  BUN 10 13  CREATININE 0.86 1.28*  CALCIUM 8.4* 8.2*  MG  --  1.7   Liver Function Tests: No results for input(s): AST, ALT, ALKPHOS, BILITOT, PROT, ALBUMIN in the last 72 hours. No results for input(s): LIPASE, AMYLASE in the last 72 hours. CBC: Recent Labs    10/19/20 1704 10/21/20 0331  WBC 12.9* 18.9*  NEUTROABS  --  15.8*  HGB 12.5 12.9  HCT 37.8 38.0  MCV  90.0 89.4  PLT 412* 416*   Cardiac Enzymes: No results for input(s): CKTOTAL, CKMB, CKMBINDEX, TROPONINI in the last 72 hours. BNP: Invalid input(s): POCBNP D-Dimer: Recent Labs    10/19/20 1704  DDIMER 3.38*   Hemoglobin A1C: No results for input(s): HGBA1C in the last 72 hours. Fasting Lipid Panel: No results for input(s): CHOL, HDL, LDLCALC, TRIG, CHOLHDL, LDLDIRECT in the last 72 hours. Thyroid Function Tests: Recent Labs    10/20/20 0112  TSH 1.661   Anemia Panel: No results for input(s): VITAMINB12, FOLATE, FERRITIN, TIBC, IRON, RETICCTPCT in the last 72 hours.  CT Angio Chest PE W and/or Wo Contrast  Result Date: 10/19/2020 CLINICAL DATA:  Difficulty breathing. EXAM: CT ANGIOGRAPHY CHEST WITH CONTRAST TECHNIQUE: Multidetector CT imaging of the chest was performed using the standard protocol during bolus administration of intravenous contrast. Multiplanar CT image reconstructions and MIPs were obtained to evaluate the vascular anatomy. CONTRAST:  48mL OMNIPAQUE IOHEXOL 350 MG/ML SOLN COMPARISON:  None. FINDINGS: Cardiovascular: Satisfactory opacification of the pulmonary arteries to the segmental level. No evidence of pulmonary embolism. Normal heart size. No pericardial effusion. Mediastinum/Nodes: No enlarged mediastinal, hilar, or axillary lymph nodes. Thyroid gland, trachea, and esophagus demonstrate  no significant findings. Lungs/Pleura: A 1.0 cm noncalcified lung nodule is seen within the anterolateral aspect of the right apex (axial CT image 18, CT series number 6). Moderate severity patchy infiltrates are seen within the bilateral lower lobes. Small bilateral pleural effusions are noted. No pneumothorax is identified. Upper Abdomen: No acute abnormality. Musculoskeletal: No chest wall abnormality. No acute or significant osseous findings. Review of the MIP images confirms the above findings. IMPRESSION: 1. Moderate severity bilateral lower lobe infiltrates. 2. Small bilateral  pleural effusions. 3. 1.0 cm noncalcified right apical lung nodule. Consider one of the following in 3 months for both low-risk and high-risk individuals: (a) repeat chest CT, (b) follow-up PET-CT, or (c) tissue sampling. This recommendation follows the consensus statement: Guidelines for Management of Incidental Pulmonary Nodules Detected on CT Images: From the Fleischner Society 2017; Radiology 2017; 284:228-243. Electronically Signed   By: Aram Candela M.D.   On: 10/19/2020 20:47   DG Chest Portable 1 View  Result Date: 10/19/2020 CLINICAL DATA:  Shortness of breath EXAM: PORTABLE CHEST 1 VIEW COMPARISON:  02/12/2020 FINDINGS: Mild cardiomegaly. No focal opacity or pleural effusion. No pneumothorax IMPRESSION: No active disease.  Mild cardiomegaly Electronically Signed   By: Jasmine Pang M.D.   On: 10/19/2020 17:53     Echo pending  TELEMETRY: Tachycardia rate of 120 atrial fibrillation with atrial flutter less likely sinus tach  ASSESSMENT AND PLAN:  Principal Problem:   Bilateral pneumonia Active Problems:   Morbid obesity with BMI of 50.0-59.9, adult (HCC)   Asthma, mild intermittent   Tachyarrhythmia Cough  Plan Patient states she feels much better but heart rate still 120 Patient refuses to take any more metoprolol she does not like her heart rate in the 90s she says it makes her feel funny Patient states she feels more comfortable with heart rate of 1 20-1 30 Shortness of breath somewhat improved continue cough medications inhalers as necessary Patient still has significant cough no significant sputum production continue cough medications Recommend significant weight loss exercise portion control Continue broad-spectrum antibiotic therapy for pneumonia Inhalers for asthma which may increase heart rate   Alwyn Pea, MD 10/21/2020 8:35 AM

## 2020-10-22 DIAGNOSIS — I5021 Acute systolic (congestive) heart failure: Secondary | ICD-10-CM

## 2020-10-22 LAB — BASIC METABOLIC PANEL
Anion gap: 7 (ref 5–15)
BUN: 11 mg/dL (ref 6–20)
CO2: 23 mmol/L (ref 22–32)
Calcium: 8 mg/dL — ABNORMAL LOW (ref 8.9–10.3)
Chloride: 108 mmol/L (ref 98–111)
Creatinine, Ser: 0.94 mg/dL (ref 0.44–1.00)
GFR, Estimated: >60 mL/min (ref >60)
Glucose, Bld: 104 mg/dL — ABNORMAL HIGH (ref 70–99)
Potassium: 3.8 mmol/L (ref 3.5–5.1)
Sodium: 138 mmol/L (ref 135–145)

## 2020-10-22 LAB — CBC
HCT: 36.4 % (ref 36.0–46.0)
Hemoglobin: 11.8 g/dL — ABNORMAL LOW (ref 12.0–15.0)
MCH: 29.5 pg (ref 26.0–34.0)
MCHC: 32.4 g/dL (ref 30.0–36.0)
MCV: 91 fL (ref 80.0–100.0)
Platelets: 344 10*3/uL (ref 150–400)
RBC: 4 MIL/uL (ref 3.87–5.11)
RDW: 14.2 % (ref 11.5–15.5)
WBC: 12 10*3/uL — ABNORMAL HIGH (ref 4.0–10.5)
nRBC: 0 % (ref 0.0–0.2)

## 2020-10-22 LAB — HEMOGLOBIN A1C
Hgb A1c MFr Bld: 5.7 % — ABNORMAL HIGH (ref 4.8–5.6)
Mean Plasma Glucose: 116.89 mg/dL

## 2020-10-22 LAB — MAGNESIUM: Magnesium: 1.9 mg/dL (ref 1.7–2.4)

## 2020-10-22 LAB — BRAIN NATRIURETIC PEPTIDE: B Natriuretic Peptide: 557.6 pg/mL — ABNORMAL HIGH (ref 0.0–100.0)

## 2020-10-22 MED ORDER — AZITHROMYCIN 250 MG PO TABS
500.0000 mg | ORAL_TABLET | Freq: Every day | ORAL | Status: DC
  Administered 2020-10-22: 500 mg via ORAL
  Filled 2020-10-22: qty 2

## 2020-10-22 MED ORDER — DIGOXIN 125 MCG PO TABS
0.1250 mg | ORAL_TABLET | Freq: Every day | ORAL | Status: DC
  Administered 2020-10-22 – 2020-10-23 (×2): 0.125 mg via ORAL
  Filled 2020-10-22 (×2): qty 1

## 2020-10-22 MED ORDER — CARVEDILOL 3.125 MG PO TABS
3.1250 mg | ORAL_TABLET | Freq: Two times a day (BID) | ORAL | Status: DC
  Administered 2020-10-22 – 2020-10-23 (×2): 3.125 mg via ORAL
  Filled 2020-10-22 (×2): qty 1

## 2020-10-22 MED ORDER — SACUBITRIL-VALSARTAN 24-26 MG PO TABS
0.5000 | ORAL_TABLET | Freq: Two times a day (BID) | ORAL | Status: DC
  Administered 2020-10-22: 0.5 via ORAL
  Administered 2020-10-23: 1 via ORAL
  Filled 2020-10-22 (×2): qty 1

## 2020-10-22 MED ORDER — SODIUM CHLORIDE 0.9 % IV BOLUS
250.0000 mL | Freq: Once | INTRAVENOUS | Status: AC
  Administered 2020-10-22: 250 mL via INTRAVENOUS

## 2020-10-22 NOTE — Progress Notes (Signed)
[facility]    SUBJECTIVE: Patient states to feel reasonably well she still tachycardic had some nausea this morning blood pressures been low at times refuses to take beta-blockers or digoxin   Vitals:   10/21/20 1603 10/21/20 2114 10/22/20 0438 10/22/20 0830  BP: 120/80 98/72 (!) 91/57 93/61  Pulse: 91 94 75 (!) 120  Resp: 20 20 20  (!) 24  Temp: 98 F (36.7 C) 98.1 F (36.7 C) 98.5 F (36.9 C) 98.6 F (37 C)  TempSrc:    Oral  SpO2: 94% 95% 96% 97%  Weight:      Height:         Intake/Output Summary (Last 24 hours) at 10/22/2020 1021 Last data filed at 10/22/2020 0950 Gross per 24 hour  Intake 1256.31 ml  Output 3150 ml  Net -1893.69 ml      PHYSICAL EXAM  General: Well developed, well nourished, in no acute distress HEENT:  Normocephalic and atramatic Neck:  No JVD.  Lungs: Clear bilaterally to auscultation and percussion. Heart: Tachycardia irregular. Normal S1 and S2 without gallops or murmurs.  Abdomen: Bowel sounds are positive, abdomen soft and non-tender  Msk:  Back normal, normal gait. Normal strength and tone for age. Extremities: No clubbing, cyanosis or edema.   Neuro: Alert and oriented X 3. Psych:  Good affect, responds appropriately   LABS: Basic Metabolic Panel: Recent Labs    10/21/20 0331 10/22/20 0441  NA 133* 138  K 4.4 3.8  CL 107 108  CO2 17* 23  GLUCOSE 143* 104*  BUN 13 11  CREATININE 1.28* 0.94  CALCIUM 8.2* 8.0*  MG 1.7 1.9   Liver Function Tests: No results for input(s): AST, ALT, ALKPHOS, BILITOT, PROT, ALBUMIN in the last 72 hours. No results for input(s): LIPASE, AMYLASE in the last 72 hours. CBC: Recent Labs    10/21/20 0331 10/22/20 0441  WBC 18.9* 12.0*  NEUTROABS 15.8*  --   HGB 12.9 11.8*  HCT 38.0 36.4  MCV 89.4 91.0  PLT 416* 344   Cardiac Enzymes: No results for input(s): CKTOTAL, CKMB, CKMBINDEX, TROPONINI in the last 72 hours. BNP: Invalid input(s): POCBNP D-Dimer: Recent Labs    10/19/20 1704   DDIMER 3.38*   Hemoglobin A1C: Recent Labs    10/22/20 0441  HGBA1C 5.7*   Fasting Lipid Panel: No results for input(s): CHOL, HDL, LDLCALC, TRIG, CHOLHDL, LDLDIRECT in the last 72 hours. Thyroid Function Tests: Recent Labs    10/20/20 0112  TSH 1.661   Anemia Panel: No results for input(s): VITAMINB12, FOLATE, FERRITIN, TIBC, IRON, RETICCTPCT in the last 72 hours.  ECHOCARDIOGRAM COMPLETE  Result Date: 10/21/2020    ECHOCARDIOGRAM REPORT   Patient Name:   Nicole Dillon Date of Exam: 10/21/2020 Medical Rec #:  [id number]      Height:       66.0 in Accession #:    [id number]     Weight:       370.6 lb Date of Birth:  [birth year]       BSA:          2.599 m Patient Age:    25 years       BP:           Not listed in chart /Not listed in  chart mmHg Patient Gender: F              HR:           Not listed in chart bpm. Exam Location:  [facility] Procedure: 2D Echo, Cardiac Doppler and Color Doppler Indications:     Atrial Fibrillation I48.91  History:         Patient has no prior history of Echocardiogram examinations.  Sonographer:     Cristela Blue RDCS (AE) Referring Phys:  [id number] [facility] D [place] Diagnosing Phys: Harold Hedge MD  Sonographer Comments: Suboptimal apical window and no subcostal window. IMPRESSIONS  1. Left ventricular ejection fraction, by estimation, is <20%. The left ventricle has severely decreased function. The left ventricle demonstrates global hypokinesis. The left ventricular internal cavity size was mildly dilated. Left ventricular diastolic function could not be evaluated.  2. Right ventricular systolic function was not well visualized. The right ventricular size is mildly enlarged.  3. Moderate pleural effusion in the left lateral region.  4. The mitral valve was not well visualized. Mild mitral valve regurgitation.  5. The aortic valve was not well visualized. Aortic valve regurgitation is trivial. FINDINGS  Left Ventricle: Left  ventricular ejection fraction, by estimation, is <20%. The left ventricle has severely decreased function. The left ventricle demonstrates global hypokinesis. The left ventricular internal cavity size was mildly dilated. There is no  left ventricular hypertrophy. Left ventricular diastolic function could not be evaluated. Right Ventricle: The right ventricular size is mildly enlarged. Right vetricular wall thickness was not well visualized. Right ventricular systolic function was not well visualized. Left Atrium: Left atrial size was normal in size. Right Atrium: Right atrial size was normal in size. Pericardium: There is no evidence of pericardial effusion. Mitral Valve: The mitral valve was not well visualized. Mild mitral valve regurgitation. Tricuspid Valve: The tricuspid valve is grossly normal. Tricuspid valve regurgitation is trivial. Aortic Valve: The aortic valve was not well visualized. Aortic valve regurgitation is trivial. Aortic valve mean gradient measures 2.0 mmHg. Aortic valve peak gradient measures 4.0 mmHg. Aortic valve area, by VTI measures 2.22 cm. Pulmonic Valve: The pulmonic valve was not well visualized. Pulmonic valve regurgitation is trivial. Aorta: The aortic root was not well visualized. IAS/Shunts: The interatrial septum was not assessed. Additional Comments: There is a moderate pleural effusion in the left lateral region.  LEFT VENTRICLE PLAX 2D LVIDd:         5.72 cm LVIDs:         5.39 cm LV PW:         1.00 cm LV IVS:        0.91 cm LVOT diam:     2.20 cm LV SV:         34 LV SV Index:   13 LVOT Area:     3.80 cm  LV Volumes (MOD) LV vol d, MOD A4C: 179.0 ml LV vol s, MOD A4C: 164.0 ml LV SV MOD A4C:     179.0 ml RIGHT VENTRICLE RV Basal diam:  4.03 cm RV S prime:     7.94 cm/s LEFT ATRIUM           Index       RIGHT ATRIUM           Index LA diam:      3.90 cm 1.50 cm/m  RA Area:     19.80 cm LA Vol (A4C): 52.9 ml 20.35 ml/m RA Volume:   60.30 ml  23.20 ml/m  AORTIC VALVE                    PULMONIC VALVE AV Area (Vmax):    2.03 cm    PV Vmax:        0.73 m/s AV Area (Vmean):   2.09 cm    PV Peak grad:   2.1 mmHg AV Area (VTI):     2.22 cm    RVOT Peak grad: 3 mmHg AV Vmax:           100.00 cm/s AV Vmean:          71.400 cm/s AV VTI:            0.152 m AV Peak Grad:      4.0 mmHg AV Mean Grad:      2.0 mmHg LVOT Vmax:         53.50 cm/s LVOT Vmean:        39.200 cm/s LVOT VTI:          0.089 m LVOT/AV VTI ratio: 0.58  AORTA Ao Root diam: 2.40 cm MITRAL VALVE               TRICUSPID VALVE MV Area (PHT): 15.80 cm   TR Peak grad:   32.7 mmHg MV Decel Time: 48 msec     TR Vmax:        286.00 cm/s MV E velocity: 99.00 cm/s                            SHUNTS                            Systemic VTI:  0.09 m                            Systemic Diam: 2.20 cm Harold Hedge MD Electronically signed by Harold Hedge MD Signature Date/Time: 10/21/2020/3:18:47 PM    Final      Echo severely depressed left ventricular function ejection fraction less than 20% probably nonischemic will need heart failure therapy  TELEMETRY: Atrial fibrillation rate of 140:  ASSESSMENT AND PLAN:  Principal Problem:   Bilateral pneumonia Active Problems:   Morbid obesity with BMI of 50.0-59.9, adult (HCC)   Asthma, mild intermittent   Tachyarrhythmia Severe cardiomyopathy probably nonischemic unclear etiology  Plan Continue amiodarone IV for tachycardia switch to p.o. if patient able to tolerate p.o. at 200 mg twice a day Strongly recommend either metoprolol or digoxin but patient currently refuses Tachycardia is not problematic will probably need referral to EP at some point to consider ablation for A. fib a flutter Recommend sleep study for possible obstructive sleep apnea Continue antibiotic therapy for bilateral pneumonia Inhalers as necessary for asthma Severe cardiomyopathy would recommend heart failure therapy including Entresto Coreg. Consider AICD if function does not improve Would recommend  Coreg Entresto low-dose diuretics amiodarone all p.o. if patient is willing to take  Alwyn Pea, MD 10/22/2020 10:21 AM

## 2020-10-22 NOTE — Progress Notes (Signed)
PHARMACIST - PHYSICIAN COMMUNICATION  CONCERNING: Antibiotic IV to Oral Route Change Policy  RECOMMENDATION: This patient is receiving azithromycin by the intravenous route.  Based on criteria approved by the Pharmacy and Therapeutics Committee, the antibiotic(s) is/are being converted to the equivalent oral dose form(s).   DESCRIPTION: These criteria include:  Patient being treated for a respiratory tract infection, urinary tract infection, cellulitis or clostridium difficile associated diarrhea if on metronidazole  The patient is not neutropenic and does not exhibit a GI malabsorption state  The patient is eating (either orally or via tube) and/or has been taking other orally administered medications for a least 24 hours  The patient is improving clinically and has a Tmax < 100.5  If you have questions about this conversion, please contact the Pharmacy Department   Skyelyn Scruggs B Merton Wadlow  10/22/20   

## 2020-10-22 NOTE — Progress Notes (Signed)
   10/22/20 0830  Assess: MEWS Score  Temp 98.6 F (37 C)  BP 93/61  Pulse Rate (!) 120  Resp (!) 24  SpO2 97 %  O2 Device Room Air  Assess: MEWS Score  MEWS Temp 0  MEWS Systolic 1  MEWS Pulse 2  MEWS RR 1  MEWS LOC 0  MEWS Score 4  MEWS Score Color Red  Assess: if the MEWS score is Yellow or Red  Were vital signs taken at a resting state? Yes  Focused Assessment No change from prior assessment  Early Detection of Sepsis Score *See Row Information* Low  MEWS guidelines implemented *See Row Information* Yes  Treat  Pain Scale 0-10  Pain Score 0  Notify: Charge Nurse/RN  Name of Charge Nurse/RN Notified Shanda Bumps Rn  Date Charge Nurse/RN Notified 10/22/20  Time Charge Nurse/RN Notified [id number]  Notify: Provider  Provider Name/Title Dr. Mayford Knife  Date Provider Notified 10/22/20  Time Provider Notified [phone]  Notification Type Page (secure chat)  Notification Reason Other (Comment) (new medications- held BP meds for now)  Provider response No new orders  Date of Provider Response 10/22/20  Time of Provider Response 0919    MD aware- there are not any new orders.  Will continue to monitor  Hold medications if MAP is < 65 per MD

## 2020-10-22 NOTE — Progress Notes (Addendum)
Patient BP low- noted to be 93/61.  Holding morning medication.  Patient has a RED MEWS-   Has had previous issues with HR and RR over past 48 hrs.   Will continue to monitor vitals closely, due to change in BP prior to given medications   Paged MD-  Awaiting orders Per MD- will continue to monitor if as long as patient MAP is >65.

## 2020-10-22 NOTE — Progress Notes (Signed)
PROGRESS NOTE    Nicole Dillon  GLO:[id number] DOB: 1996-03-24 DOA: 10/19/2020 [facility]CP: [facility]    Assessment & Plan:   Principal Problem:   Bilateral pneumonia Active Problems:   Morbid obesity with BMI of 50.0-59.9, adult (HCC)   Asthma, mild intermittent   Tachyarrhythmia   CAP:b/l infiltrates as per CTA chest. Continue on IV rocephin, azithromycin, bronchodilators and encourage incentive spirometry   Likely a. fib vs a. flutter: continue on metoprolol & amio as per cardio. D/c amiodarone drip. Continue on tele. Cardio recs apprec  Acute systolic CHF exacerbation: echo shows EF <20%, cannot determine diastolic function, LV demonstrates global hypokinesis. Will need a life vest. Continue on metoprolol. Management per cardio.   Morbid obesity: BMI 59.3. Complicates overall care and prognosis  Leukocytosis: likely secondary to above infection. Continue on IV abxs   Thrombocytosis: resolved   AKI: resolved    DVT prophylaxis: lovenox  Code Status: full  Family Communication:  Disposition Plan: likely d/c back home.  Level of care: Progressive Cardiac   Status is: Inpatient  Remains inpatient appropriate because:IV treatments appropriate due to intensity of illness or inability to take PO and Inpatient level of care appropriate due to severity of illness   Dispo: The patient is from: Home              Anticipated d/c is to: Home              Patient currently is not medically stable to d/c.   Difficult to place patient: unclear      Consultants:      Procedures:    Antimicrobials: ceftriaxone, azithromycin    Subjective: Pt c/o vomiting   Objective: Vitals:   10/21/20 1158 10/21/20 1603 10/21/20 2114 10/22/20 0438  BP: (!) 115/94 120/80 98/72 (!) 91/57  Pulse: (!) 110 91 94 75  Resp: Temp: 98 F (36.7 C) 98 F (36.7 C) 98.1 F (36.7 C) 98.5 F (36.9 C)  TempSrc:      SpO2: 97% 94% 95% 96%  Weight:       Height:        Intake/Output Summary (Last 24 hours) at 10/22/2020 0747 Last data filed at 10/22/2020 0745 Gross per 24 hour  Intake 896.31 ml  Output 3150 ml  Net -2253.69 ml   Filed Weights   10/19/20 1707 10/20/20 0700 10/21/20 0900  Weight: (!) 158.8 kg (!) 166.8 kg (!) 168.1 kg    Examination:  General exam: Appears uncomfortable. Morbidly obese Respiratory system: decreased breath sounds b/l  Cardiovascular system: irregularly irregular. No rubs or clicks  Gastrointestinal system: Abd is soft, NT, obese & hyperactive bowel sounds Central nervous system: Alert and oriented. Moves all extremities  Psychiatry: Judgement and insight appear normal. Appropriate mood and affect      Data Reviewed: I have personally reviewed following labs and imaging studies  CBC: Recent Labs  Lab 10/19/20 1704 10/21/20 0331 10/22/20 0441  WBC 12.9* 18.9* 12.0*  NEUTROABS  --  15.8*  --   HGB 12.5 12.9 11.8*  HCT 37.8 38.0 36.4  MCV 90.0 89.4 91.0  PLT 412* 416* 344   Basic Metabolic Panel: Recent Labs  Lab 10/19/20 1704 10/21/20 0331 10/22/20 0441  NA 136 133* 138  K 3.9 4.4 3.8  CL 107 107 108  CO2 21* 17* 23  GLUCOSE 110* 143* 104*  BUN CREATININE 0.86 1.28* 0.94  CALCIUM 8.4* 8.2*  8.0*  MG  --  1.7 1.9   GFR: Estimated Creatinine Clearance: 148.5 mL/min (by C-G formula based on SCr of 0.94 mg/dL). Liver Function Tests: No results for input(s): AST, ALT, ALKPHOS, BILITOT, PROT, ALBUMIN in the last 168 hours. No results for input(s): LIPASE, AMYLASE in the last 168 hours. No results for input(s): AMMONIA in the last 168 hours. Coagulation Profile: No results for input(s): INR, PROTIME in the last 168 hours. Cardiac Enzymes: No results for input(s): CKTOTAL, CKMB, CKMBINDEX, TROPONINI in the last 168 hours. BNP (last 3 results) No results for input(s): PROBNP in the last 8760 hours. HbA1C: No results for input(s): HGBA1C in the last 72 hours. CBG: No  results for input(s): GLUCAP in the last 168 hours. Lipid Profile: No results for input(s): CHOL, HDL, LDLCALC, TRIG, CHOLHDL, LDLDIRECT in the last 72 hours. Thyroid Function Tests: Recent Labs    10/20/20 0112  TSH 1.661   Anemia Panel: No results for input(s): VITAMINB12, FOLATE, FERRITIN, TIBC, IRON, RETICCTPCT in the last 72 hours. Sepsis Labs: Recent Labs  Lab 10/19/20 2110 10/20/20 0112  LATICACIDVEN 1.2 2.0*    Recent Results (from the past 240 hour(s))  Resp Panel by RT-PCR (Flu A&B, Covid) Nasopharyngeal Swab     Status: None   Collection Time: 10/19/20  5:30 PM   Specimen: Nasopharyngeal Swab; Nasopharyngeal(NP) swabs in vial transport medium  Result Value Ref Range Status   SARS Coronavirus 2 by RT PCR NEGATIVE NEGATIVE Final    Comment: (NOTE) SARS-CoV-2 target nucleic acids are NOT DETECTED.  The SARS-CoV-2 RNA is generally detectable in upper respiratory specimens during the acute phase of infection. The lowest concentration of SARS-CoV-2 viral copies this assay can detect is 138 copies/mL. A negative result does not preclude SARS-Cov-2 infection and should not be used as the sole basis for treatment or other patient management decisions. A negative result may occur with  improper specimen collection/handling, submission of specimen other than nasopharyngeal swab, presence of viral mutation(s) within the areas targeted by this assay, and inadequate number of viral copies(<138 copies/mL). A negative result must be combined with clinical observations, patient history, and epidemiological information. The expected result is Negative.  Fact Sheet for Patients:  BloggerCourse.com  Fact Sheet for Healthcare Providers:  SeriousBroker.it  This test is no t yet approved or cleared by the [place] FDA and  has been authorized for detection and/or diagnosis of SARS-CoV-2 by FDA under an Emergency Use  Authorization (EUA). This EUA will remain  in effect (meaning this test can be used) for the duration of the COVID-19 declaration under Section 564(b)(1) of the Act, 21 U.S.C.section 360bbb-3(b)(1), unless the authorization is terminated  or revoked sooner.       Influenza A by PCR NEGATIVE NEGATIVE Final   Influenza B by PCR NEGATIVE NEGATIVE Final    Comment: (NOTE) The Xpert Xpress SARS-CoV-2/FLU/RSV plus assay is intended as an aid in the diagnosis of influenza from Nasopharyngeal swab specimens and should not be used as a sole basis for treatment. Nasal washings and aspirates are unacceptable for Xpert Xpress SARS-CoV-2/FLU/RSV testing.  Fact Sheet for Patients: BloggerCourse.com  Fact Sheet for Healthcare Providers: SeriousBroker.it  This test is not yet approved or cleared by the [place] FDA and has been authorized for detection and/or diagnosis of SARS-CoV-2 by FDA under an Emergency Use Authorization (EUA). This EUA will remain in effect (meaning this test can be used) for the duration of the COVID-19 declaration under Section 564(b)(1) of  the Act, 21 U.S.C. section 360bbb-3(b)(1), unless the authorization is terminated or revoked.  Performed at [facility], [address] [place], [place] [address]   Culture, blood (routine x 2)     Status: None (Preliminary result)   Collection Time: 10/19/20  9:05 PM   Specimen: BLOOD  Result Value Ref Range Status   Specimen Description BLOOD BLOOD LEFT FOREARM  Final   Special Requests   Final    BOTTLES DRAWN AEROBIC AND ANAEROBIC Blood Culture adequate volume   Culture   Final    NO GROWTH 3 DAYS Performed at [facility], [address], [place], [place] [address]    Report Status PENDING  Incomplete  Culture, blood (routine x 2)     Status: None (Preliminary result)   Collection Time: 10/19/20  9:10 PM   Specimen: BLOOD  Result Value Ref  Range Status   Specimen Description BLOOD RIGHT ANTECUBITAL  Final   Special Requests   Final    BOTTLES DRAWN AEROBIC AND ANAEROBIC Blood Culture adequate volume   Culture   Final    NO GROWTH 3 DAYS Performed at [facility], [address] [place], [place] [address]    Report Status PENDING  Incomplete  MRSA PCR Screening     Status: None   Collection Time: 10/21/20  7:31 AM   Specimen: Nasal Mucosa; Nasopharyngeal  Result Value Ref Range Status   MRSA by PCR NEGATIVE NEGATIVE Final    Comment:        The GeneXpert MRSA Assay (FDA approved for NASAL specimens only), is one component of a comprehensive MRSA colonization surveillance program. It is not intended to diagnose MRSA infection nor to guide or monitor treatment for MRSA infections. Performed at [facility], [address], [place], [place] [address]          Radiology Studies: ECHOCARDIOGRAM COMPLETE  Result Date: 10/21/2020    ECHOCARDIOGRAM REPORT   Patient Name:   JALEYAH LONGHI Date of Exam: 10/21/2020 Medical Rec #:  [id number]      Height:       66.0 in Accession #:    [id number]     Weight:       370.6 lb Date of Birth:  01-25-1996       BSA:          2.599 m Patient Age:    24 years       BP:           Not listed in chart /Not listed in                                              chart mmHg Patient Gender: F              HR:           Not listed in chart bpm. Exam Location:  [facility] Procedure: 2D Echo, Cardiac Doppler and Color Doppler Indications:     Atrial Fibrillation I48.91  History:         Patient has no prior history of Echocardiogram examinations.  Sonographer:     Cristela Blue RDCS (AE) Referring Phys:  [id number] [facility] D [place] Diagnosing Phys: Harold Hedge MD  Sonographer Comments: Suboptimal apical window and no subcostal window. IMPRESSIONS  1. Left ventricular ejection fraction, by estimation, is <20%. The  left ventricle has severely decreased function. The left ventricle demonstrates  global hypokinesis. The left ventricular internal cavity size was mildly dilated. Left ventricular diastolic function could not be evaluated.  2. Right ventricular systolic function was not well visualized. The right ventricular size is mildly enlarged.  3. Moderate pleural effusion in the left lateral region.  4. The mitral valve was not well visualized. Mild mitral valve regurgitation.  5. The aortic valve was not well visualized. Aortic valve regurgitation is trivial. FINDINGS  Left Ventricle: Left ventricular ejection fraction, by estimation, is <20%. The left ventricle has severely decreased function. The left ventricle demonstrates global hypokinesis. The left ventricular internal cavity size was mildly dilated. There is no  left ventricular hypertrophy. Left ventricular diastolic function could not be evaluated. Right Ventricle: The right ventricular size is mildly enlarged. Right vetricular wall thickness was not well visualized. Right ventricular systolic function was not well visualized. Left Atrium: Left atrial size was normal in size. Right Atrium: Right atrial size was normal in size. Pericardium: There is no evidence of pericardial effusion. Mitral Valve: The mitral valve was not well visualized. Mild mitral valve regurgitation. Tricuspid Valve: The tricuspid valve is grossly normal. Tricuspid valve regurgitation is trivial. Aortic Valve: The aortic valve was not well visualized. Aortic valve regurgitation is trivial. Aortic valve mean gradient measures 2.0 mmHg. Aortic valve peak gradient measures 4.0 mmHg. Aortic valve area, by VTI measures 2.22 cm. Pulmonic Valve: The pulmonic valve was not well visualized. Pulmonic valve regurgitation is trivial. Aorta: The aortic root was not well visualized. IAS/Shunts: The interatrial septum was not assessed. Additional Comments: There is a moderate pleural effusion in the left lateral region.  LEFT VENTRICLE PLAX 2D LVIDd:         5.72 cm LVIDs:         5.39  cm LV PW:         1.00 cm LV IVS:        0.91 cm LVOT diam:     2.20 cm LV SV:         34 LV SV Index:   13 LVOT Area:     3.80 cm  LV Volumes (MOD) LV vol d, MOD A4C: 179.0 ml LV vol s, MOD A4C: 164.0 ml LV SV MOD A4C:     179.0 ml RIGHT VENTRICLE RV Basal diam:  4.03 cm RV S prime:     7.94 cm/s LEFT ATRIUM           Index       RIGHT ATRIUM           Index LA diam:      3.90 cm 1.50 cm/m  RA Area:     19.80 cm LA Vol (A4C): 52.9 ml 20.35 ml/m RA Volume:   60.30 ml  23.20 ml/m  AORTIC VALVE                   PULMONIC VALVE AV Area (Vmax):    2.03 cm    PV Vmax:        0.73 m/s AV Area (Vmean):   2.09 cm    PV Peak grad:   2.1 mmHg AV Area (VTI):     2.22 cm    RVOT Peak grad: 3 mmHg AV Vmax:           100.00 cm/s AV Vmean:          71.400 cm/s AV VTI:  0.152 m AV Peak Grad:      4.0 mmHg AV Mean Grad:      2.0 mmHg LVOT Vmax:         53.50 cm/s LVOT Vmean:        39.200 cm/s LVOT VTI:          0.089 m LVOT/AV VTI ratio: 0.58  AORTA Ao Root diam: 2.40 cm MITRAL VALVE               TRICUSPID VALVE MV Area (PHT): 15.80 cm   TR Peak grad:   32.7 mmHg MV Decel Time: 48 msec     TR Vmax:        286.00 cm/s MV E velocity: 99.00 cm/s                            SHUNTS                            Systemic VTI:  0.09 m                            Systemic Diam: 2.20 cm Harold HedgeKenneth Fath MD Electronically signed by Harold HedgeKenneth Fath MD Signature Date/Time: 10/21/2020/3:18:47 PM    Final         Scheduled Meds: . amiodarone  200 mg Oral BID  . azithromycin  500 mg Oral Daily  . [START ON 10/23/2020] digoxin  0.125 mg Oral Daily  . enoxaparin (LOVENOX) injection  0.5 mg/kg Subcutaneous Q24H  . metoprolol tartrate  12.5 mg Oral TID  . mometasone-formoterol  2 puff Inhalation BID  . montelukast  10 mg Oral QHS   Continuous Infusions: . cefTRIAXone (ROCEPHIN)  IV 2 g (10/21/20 2115)     LOS: 3 days    Time spent: 35 mins     Charise KillianJamiese M Johnelle Tafolla, MD [facility] Pager [phone]  If 7PM-7AM,  please contact night-coverage 10/22/2020, 7:47 AM

## 2020-10-22 NOTE — Progress Notes (Signed)
Patient's HR jumped up to 140-150s.  BP has improved.  New medications ordered, however will wait to start entresto.  Gave Coreg and amio at this time.  Will continue to monitor BP.    MD is aware

## 2020-10-22 NOTE — Progress Notes (Signed)
Patient complained of feeling weird.  Stated her head feels heavy.  Denies SOB, CP or dizziness.   Recheck BP -  1st was 86/52, then rechecked different arm- was 105/87 (MAP of 95).   Paged MD- since some symptoms- will give a 250 bolus.  And recheck BP afterwards.    Per MD-  Hold parameters for medications are (hold if MAP <65) or symptomatic.

## 2020-10-23 DIAGNOSIS — I4891 Unspecified atrial fibrillation: Secondary | ICD-10-CM

## 2020-10-23 DIAGNOSIS — I429 Cardiomyopathy, unspecified: Secondary | ICD-10-CM

## 2020-10-23 LAB — BASIC METABOLIC PANEL
Anion gap: 7 (ref 5–15)
BUN: 8 mg/dL (ref 6–20)
CO2: 23 mmol/L (ref 22–32)
Calcium: 8.1 mg/dL — ABNORMAL LOW (ref 8.9–10.3)
Chloride: 107 mmol/L (ref 98–111)
Creatinine, Ser: 0.81 mg/dL (ref 0.44–1.00)
GFR, Estimated: >60 mL/min (ref >60)
Glucose, Bld: 81 mg/dL (ref 70–99)
Potassium: 3.8 mmol/L (ref 3.5–5.1)
Sodium: 137 mmol/L (ref 135–145)

## 2020-10-23 LAB — CBC
HCT: 37.2 % (ref 36.0–46.0)
Hemoglobin: 12.1 g/dL (ref 12.0–15.0)
MCH: 29.5 pg (ref 26.0–34.0)
MCHC: 32.5 g/dL (ref 30.0–36.0)
MCV: 90.7 fL (ref 80.0–100.0)
Platelets: 330 10*3/uL (ref 150–400)
RBC: 4.1 MIL/uL (ref 3.87–5.11)
RDW: 14.1 % (ref 11.5–15.5)
WBC: 10.1 10*3/uL (ref 4.0–10.5)
nRBC: 0 % (ref 0.0–0.2)

## 2020-10-23 LAB — MAGNESIUM: Magnesium: 1.9 mg/dL (ref 1.7–2.4)

## 2020-10-23 MED ORDER — AMIODARONE HCL 200 MG PO TABS: 200.0000 mg | ORAL_TABLET | Freq: Two times a day (BID) | ORAL | 0 refills | Status: DC

## 2020-10-23 MED ORDER — DOXYCYCLINE MONOHYDRATE 100 MG PO TABS: 100.0000 mg | ORAL_TABLET | Freq: Two times a day (BID) | ORAL | 0 refills | Status: AC

## 2020-10-23 MED ORDER — SACUBITRIL-VALSARTAN 24-26 MG PO TABS: 1.0000 | ORAL_TABLET | Freq: Two times a day (BID) | ORAL | Status: DC

## 2020-10-23 MED ORDER — SACUBITRIL-VALSARTAN 24-26 MG PO TABS: 1.0000 | ORAL_TABLET | Freq: Two times a day (BID) | ORAL | 0 refills | Status: AC

## 2020-10-23 MED ORDER — DIGOXIN 125 MCG PO TABS: 0.1250 mg | ORAL_TABLET | Freq: Every day | ORAL | 0 refills | Status: DC

## 2020-10-23 MED ORDER — CARVEDILOL 3.125 MG PO TABS: 3.1250 mg | ORAL_TABLET | Freq: Two times a day (BID) | ORAL | 0 refills | Status: DC

## 2020-10-23 NOTE — Discharge Summary (Signed)
Physician Discharge Summary  Nicole Dillon NWG:[id number] DOB: 02-Dec-1995 DOA: 10/19/2020  PCP: [facility]  Admit date: 10/19/2020 Discharge date: 10/23/2020  Admitted From: home  Disposition:  Home   Recommendations for Outpatient Follow-up:  1. Follow up with PCP in 1-2 weeks 2. F/u w/ cardio, Dr. Juliann Pares, in 1 week   Home Health: no  Equipment/Devices:  Discharge Condition: stable  CODE STATUS: full Diet recommendation: Heart Healthy / Carb Modified   Brief/Interim Summary: HPI was taken from Dr. Para March: Nicole Dillon is a 25 y.o. female with medical history significant for Asthma, morbid obesity, irregular heart rate since childhood, chronic tachycardia as far back as 2017 when patient had Holter monitor with findings of sinus tachycardia with PVCs, rate 80-189, average 145, with heart rate in the 180s during an ER visit in summer 2021 placed on oral metoprolol at that time, and now presents to the emergency room with a complaint of wheezing, shortness of breath, congested cough and seasonal allergy symptoms x1 week.  Denies fever or chills and had a negative COVID test however symptoms persisted.  She denies chest pain, nausea or vomiting or abdominal pain.  Denies myalgias ED course: Upon arrival noted to have heart rate of 185, with BP 119/52, respirations 24 with O2 sat 97% on room air.  Temperature 98.6.  Blood work showed leukocytosis of 13,000, D-dimer 3.38, BNP 381 and troponin of 9 EKG: Several EKGs done in the emergency room prior to and subsequent to administration of a diltiazem bolus.  Initially showed sinus tachycardia with rate 185.  Following diltiazem read as A. fib at 140 with no acute ST-T wave changes Imaging: CTA chest: Negative for PE but with a moderate severity bilateral lower lobe infiltrates  Patient started on Rocephin and azithromycin.  Was also given an IV fluid bolus.  Patient was given diltiazem bolus for her heart rate with  improvement to the 130s and was subsequently placed on a diltiazem infusion.  Hospitalist consulted for admission.  Hospital course from Dr. Mayford Knife 5/5-10/23/20: Pt presented w/ pneumonia and was started on IV ceftriaxone, azithromycin and was d/c w/ po doxycyline at d/c secondary to azithromycin ADRs w/ digoxin, amiodarone (for which pt was also d/c with). Of note, pt was found to have EF <20%, LV demonstrates global hypokinesis of unknown etiology. A life vest was offered to the pt but pt deferred.  Pt was put on carvedilol, entersto & digoxin as per cardio. Furthermore, pt was found to have a. fib/flutter and was initially put on IV amiodarone drip & metoprolol. IV amio was weaned off and switched to po amiodarone & po metoprolol was continued. Pt will f/u w/ cardio, Dr. Juliann Pares, in 1 week for further evaluation/work-up/treatment. For more information, please see previous progress/consult notes.     Discharge Diagnoses:  Principal Problem:   Bilateral pneumonia Active Problems:   Morbid obesity with BMI of 50.0-59.9, adult (HCC)   Asthma, mild intermittent   Tachyarrhythmia  CAP:b/l infiltrates as per CTA chest. Continue on IV rocephin, azithromycin while inpatient and switch po doxycyline at d/c.Continue on bronchodilators and encourage incentive spirometry   Likely a. fib vs a. flutter: continue on metoprolol,amio & digoxin as per cardio. D/c amiodarone drip. Continue on tele. Cardio recs apprec  Cardiomyopathy: etiology unclear. Echo shows EF <20%, cannot determine diastolic function, LV demonstrates global hypokinesis. Pt deferred life vest. Continue on metoprolol, entresto, & digoxin as per cardio  Morbid obesity: BMI 59.3. Complicates overall care and prognosis  Pre-diabetes: HbA1c 5.7. Would benefit from significant weight loss   Leukocytosis: resolved   Thrombocytosis: resolved    Discharge Instructions  Discharge Instructions    Amb referral to [facility]   Complete by:  As directed    Diet - low sodium heart healthy   Complete by: As directed    Discharge instructions   Complete by: As directed    F/u w/ cardiology, Dr. Juliann Pares, in 1 week. F/u w/ PCP in 1-2 weeks.   Increase activity slowly   Complete by: As directed      Allergies as of 10/23/2020   No Known Allergies     Medication List    TAKE these medications   amiodarone 200 MG tablet Commonly known as: PACERONE Take 1 tablet (200 mg total) by mouth 2 (two) times daily.   carvedilol 3.125 MG tablet Commonly known as: COREG Take 1 tablet (3.125 mg total) by mouth 2 (two) times daily with a meal.   digoxin 0.125 MG tablet Commonly known as: LANOXIN Take 1 tablet (0.125 mg total) by mouth daily. Start taking on: Oct 24, 2020   doxycycline 100 MG tablet Commonly known as: ADOXA Take 1 tablet (100 mg total) by mouth 2 (two) times daily for 3 days.   guaiFENesin 600 MG 12 hr tablet Commonly known as: MUCINEX Take 600 mg by mouth 2 (two) times daily.   sacubitril-valsartan 24-26 MG Commonly known as: ENTRESTO Take 1 tablet by mouth 2 (two) times daily.       Follow-up Information    Callwood, Dwayne D, MD Follow up in 1 week(s).   Specialties: Cardiology, Internal Medicine Contact information: [address] [place] [address] [phone]        [facility] Follow up in 2 week(s).   Specialty: General Practice Contact information: [address] [place] [address] [phone]              No Known Allergies  Consultations:  Cardio, Dr. Juliann Pares   Procedures/Studies: CT Angio Chest PE W and/or Wo Contrast  Result Date: 10/19/2020 CLINICAL DATA:  Difficulty breathing. EXAM: CT ANGIOGRAPHY CHEST WITH CONTRAST TECHNIQUE: Multidetector CT imaging of the chest was performed using the standard protocol during bolus administration of intravenous contrast. Multiplanar CT image reconstructions and MIPs were  obtained to evaluate the vascular anatomy. CONTRAST:  75mL OMNIPAQUE IOHEXOL 350 MG/ML SOLN COMPARISON:  None. FINDINGS: Cardiovascular: Satisfactory opacification of the pulmonary arteries to the segmental level. No evidence of pulmonary embolism. Normal heart size. No pericardial effusion. Mediastinum/Nodes: No enlarged mediastinal, hilar, or axillary lymph nodes. Thyroid gland, trachea, and esophagus demonstrate no significant findings. Lungs/Pleura: A 1.0 cm noncalcified lung nodule is seen within the anterolateral aspect of the right apex (axial CT image 18, CT series number 6). Moderate severity patchy infiltrates are seen within the bilateral lower lobes. Small bilateral pleural effusions are noted. No pneumothorax is identified. Upper Abdomen: No acute abnormality. Musculoskeletal: No chest wall abnormality. No acute or significant osseous findings. Review of the MIP images confirms the above findings. IMPRESSION: 1. Moderate severity bilateral lower lobe infiltrates. 2. Small bilateral pleural effusions. 3. 1.0 cm noncalcified right apical lung nodule. Consider one of the following in 3 months for both low-risk and high-risk individuals: (a) repeat chest CT, (b) follow-up PET-CT, or (c) tissue sampling. This recommendation follows the consensus statement: Guidelines for Management of Incidental Pulmonary Nodules Detected on CT Images: From the Fleischner Society 2017; Radiology 2017; 284:228-243.  Electronically Signed   By: Aram Candela M.D.   On: 10/19/2020 20:47   DG Chest Portable 1 View  Result Date: 10/19/2020 CLINICAL DATA:  Shortness of breath EXAM: PORTABLE CHEST 1 VIEW COMPARISON:  02/12/2020 FINDINGS: Mild cardiomegaly. No focal opacity or pleural effusion. No pneumothorax IMPRESSION: No active disease.  Mild cardiomegaly Electronically Signed   By: Jasmine Pang M.D.   On: 10/19/2020 17:53   ECHOCARDIOGRAM COMPLETE  Result Date: 10/21/2020    ECHOCARDIOGRAM REPORT   Patient Name:    BABS DABBS Date of Exam: 10/21/2020 Medical Rec #:  [id number]      Height:       66.0 in Accession #:    [id number]     Weight:       370.6 lb Date of Birth:  1996-01-15       BSA:          2.599 m Patient Age:    24 years       BP:           Not listed in chart /Not listed in                                              chart mmHg Patient Gender: F              HR:           Not listed in chart bpm. Exam Location:  [facility] Procedure: 2D Echo, Cardiac Doppler and Color Doppler Indications:     Atrial Fibrillation I48.91  History:         Patient has no prior history of Echocardiogram examinations.  Sonographer:     Cristela Blue RDCS (AE) Referring Phys:  [id number] [facility] D CALLWOOD Diagnosing Phys: Harold Hedge MD  Sonographer Comments: Suboptimal apical window and no subcostal window. IMPRESSIONS  1. Left ventricular ejection fraction, by estimation, is <20%. The left ventricle has severely decreased function. The left ventricle demonstrates global hypokinesis. The left ventricular internal cavity size was mildly dilated. Left ventricular diastolic function could not be evaluated.  2. Right ventricular systolic function was not well visualized. The right ventricular size is mildly enlarged.  3. Moderate pleural effusion in the left lateral region.  4. The mitral valve was not well visualized. Mild mitral valve regurgitation.  5. The aortic valve was not well visualized. Aortic valve regurgitation is trivial. FINDINGS  Left Ventricle: Left ventricular ejection fraction, by estimation, is <20%. The left ventricle has severely decreased function. The left ventricle demonstrates global hypokinesis. The left ventricular internal cavity size was mildly dilated. There is no  left ventricular hypertrophy. Left ventricular diastolic function could not be evaluated. Right Ventricle: The right ventricular size is mildly enlarged. Right vetricular wall thickness was not well visualized. Right ventricular systolic function was not  well visualized. Left Atrium: Left atrial size was normal in size. Right Atrium: Right atrial size was normal in size. Pericardium: There is no evidence of pericardial effusion. Mitral Valve: The mitral valve was not well visualized. Mild mitral valve regurgitation. Tricuspid Valve: The tricuspid valve is grossly normal. Tricuspid valve regurgitation is trivial. Aortic Valve: The aortic valve was not well visualized. Aortic valve regurgitation is trivial. Aortic valve mean gradient measures 2.0 mmHg. Aortic valve peak gradient measures 4.0 mmHg. Aortic valve area, by VTI measures 2.22 cm. Pulmonic Valve: The pulmonic  valve was not well visualized. Pulmonic valve regurgitation is trivial. Aorta: The aortic root was not well visualized. IAS/Shunts: The interatrial septum was not assessed. Additional Comments: There is a moderate pleural effusion in the left lateral region.  LEFT VENTRICLE PLAX 2D LVIDd:         5.72 cm LVIDs:         5.39 cm LV PW:         1.00 cm LV IVS:        0.91 cm LVOT diam:     2.20 cm LV SV:         34 LV SV Index:   13 LVOT Area:     3.80 cm  LV Volumes (MOD) LV vol d, MOD A4C: 179.0 ml LV vol s, MOD A4C: 164.0 ml LV SV MOD A4C:     179.0 ml RIGHT VENTRICLE RV Basal diam:  4.03 cm RV S prime:     7.94 cm/s LEFT ATRIUM           Index       RIGHT ATRIUM           Index LA diam:      3.90 cm 1.50 cm/m  RA Area:     19.80 cm LA Vol (A4C): 52.9 ml 20.35 ml/m RA Volume:   60.30 ml  23.20 ml/m  AORTIC VALVE                   PULMONIC VALVE AV Area (Vmax):    2.03 cm    PV Vmax:        0.73 m/s AV Area (Vmean):   2.09 cm    PV Peak grad:   2.1 mmHg AV Area (VTI):     2.22 cm    RVOT Peak grad: 3 mmHg AV Vmax:           100.00 cm/s AV Vmean:          71.400 cm/s AV VTI:            0.152 m AV Peak Grad:      4.0 mmHg AV Mean Grad:      2.0 mmHg LVOT Vmax:         53.50 cm/s LVOT Vmean:        39.200 cm/s LVOT VTI:          0.089 m LVOT/AV VTI ratio: 0.58  AORTA Ao Root diam: 2.40 cm MITRAL  VALVE               TRICUSPID VALVE MV Area (PHT): 15.80 cm   TR Peak grad:   32.7 mmHg MV Decel Time: 48 msec     TR Vmax:        286.00 cm/s MV E velocity: 99.00 cm/s                            SHUNTS                            Systemic VTI:  0.09 m                            Systemic Diam: 2.20 cm Harold HedgeKenneth Fath MD Electronically signed by Harold HedgeKenneth Fath MD Signature Date/Time: 10/21/2020/3:18:47 PM    Final        Subjective: Pt c/o fatigue  Discharge Exam: Vitals:   10/23/20 1033 10/23/20 1149  BP: 129/75 113/73  Pulse: (!) 110 (!) 112  Resp:  20  Temp:  98.4 F (36.9 C)  SpO2:  95%   Vitals:   10/23/20 0400 10/23/20 0754 10/23/20 1033 10/23/20 1149  BP: 93/64 136/88 129/75 113/73  Pulse: (!) 108 (!) 109 (!) 110 (!) 112  Resp: 20 (!) 23  20  Temp: 99 F (37.2 C) 98.7 F (37.1 C)  98.4 F (36.9 C)  TempSrc: Oral Oral  Oral  SpO2: 100% 97%  95%  Weight:      Height:        General: Pt is alert, awake, not in acute distress Cardiovascular: RRR, S1/S2 +, no rubs, no gallops Respiratory: CTA bilaterally, no wheezing, no rhonchi Abdominal: Soft, NT, ND, bowel sounds + Extremities: no edema, no cyanosis    The results of significant diagnostics from this hospitalization (including imaging, microbiology, ancillary and laboratory) are listed below for reference.     Microbiology: Recent Results (from the past 240 hour(s))  Resp Panel by RT-PCR (Flu A&B, Covid) Nasopharyngeal Swab     Status: None   Collection Time: 10/19/20  5:30 PM   Specimen: Nasopharyngeal Swab; Nasopharyngeal(NP) swabs in vial transport medium  Result Value Ref Range Status   SARS Coronavirus 2 by RT PCR NEGATIVE NEGATIVE Final    Comment: (NOTE) SARS-CoV-2 target nucleic acids are NOT DETECTED.  The SARS-CoV-2 RNA is generally detectable in upper respiratory specimens during the acute phase of infection. The lowest concentration of SARS-CoV-2 viral copies this assay can detect is 138 copies/mL.  A negative result does not preclude SARS-Cov-2 infection and should not be used as the sole basis for treatment or other patient management decisions. A negative result may occur with  improper specimen collection/handling, submission of specimen other than nasopharyngeal swab, presence of viral mutation(s) within the areas targeted by this assay, and inadequate number of viral copies(<138 copies/mL). A negative result must be combined with clinical observations, patient history, and epidemiological information. The expected result is Negative.  Fact Sheet for Patients:  BloggerCourse.com  Fact Sheet for Healthcare Providers:  SeriousBroker.it  This test is no t yet approved or cleared by the [place] FDA and  has been authorized for detection and/or diagnosis of SARS-CoV-2 by FDA under an Emergency Use Authorization (EUA). This EUA will remain  in effect (meaning this test can be used) for the duration of the COVID-19 declaration under Section 564(b)(1) of the Act, 21 U.S.C.section 360bbb-3(b)(1), unless the authorization is terminated  or revoked sooner.       Influenza A by PCR NEGATIVE NEGATIVE Final   Influenza B by PCR NEGATIVE NEGATIVE Final    Comment: (NOTE) The Xpert Xpress SARS-CoV-2/FLU/RSV plus assay is intended as an aid in the diagnosis of influenza from Nasopharyngeal swab specimens and should not be used as a sole basis for treatment. Nasal washings and aspirates are unacceptable for Xpert Xpress SARS-CoV-2/FLU/RSV testing.  Fact Sheet for Patients: BloggerCourse.com  Fact Sheet for Healthcare Providers: SeriousBroker.it  This test is not yet approved or cleared by the [place] FDA and has been authorized for detection and/or diagnosis of SARS-CoV-2 by FDA under an Emergency Use Authorization (EUA). This EUA will remain in effect (meaning this test  can be used) for the duration of the COVID-19 declaration under Section 564(b)(1) of the Act, 21 U.S.C. section 360bbb-3(b)(1), unless the authorization is terminated or revoked.  Performed at [facility],  [address], [place], [place] [address]   Culture, blood (routine x 2)     Status: None (Preliminary result)   Collection Time: 10/19/20  9:05 PM   Specimen: BLOOD  Result Value Ref Range Status   Specimen Description BLOOD BLOOD LEFT FOREARM  Final   Special Requests   Final    BOTTLES DRAWN AEROBIC AND ANAEROBIC Blood Culture adequate volume   Culture   Final    NO GROWTH 4 DAYS Performed at [facility], [address] [place], [place] [address]    Report Status PENDING  Incomplete  Culture, blood (routine x 2)     Status: None (Preliminary result)   Collection Time: 10/19/20  9:10 PM   Specimen: BLOOD  Result Value Ref Range Status   Specimen Description BLOOD RIGHT ANTECUBITAL  Final   Special Requests   Final    BOTTLES DRAWN AEROBIC AND ANAEROBIC Blood Culture adequate volume   Culture   Final    NO GROWTH 4 DAYS Performed at [facility], [address] [place], [place] [address]    Report Status PENDING  Incomplete  MRSA PCR Screening     Status: None   Collection Time: 10/21/20  7:31 AM   Specimen: Nasal Mucosa; Nasopharyngeal  Result Value Ref Range Status   MRSA by PCR NEGATIVE NEGATIVE Final    Comment:        The GeneXpert MRSA Assay (FDA approved for NASAL specimens only), is one component of a comprehensive MRSA colonization surveillance program. It is not intended to diagnose MRSA infection nor to guide or monitor treatment for MRSA infections. Performed at [facility], [address] [place], [place] [address]      Labs: BNP (last 3 results) Recent Labs    10/19/20 1704 10/22/20 1139  BNP 381.7* 557.6*   Basic Metabolic Panel: Recent Labs  Lab 10/19/20 1704 10/21/20 0331 10/22/20 0441  10/23/20 0449  NA 136 133* 138 137  K 3.9 4.4 3.8 3.8  CL 107 107 108 107  CO2 21* 17* 23 23  GLUCOSE 110* 143* 104* 81  BUN CREATININE 0.86 1.28* 0.94 0.81  CALCIUM 8.4* 8.2* 8.0* 8.1*  MG  --  1.7 1.9 1.9   Liver Function Tests: No results for input(s): AST, ALT, ALKPHOS, BILITOT, PROT, ALBUMIN in the last 168 hours. No results for input(s): LIPASE, AMYLASE in the last 168 hours. No results for input(s): AMMONIA in the last 168 hours. CBC: Recent Labs  Lab 10/19/20 1704 10/21/20 0331 10/22/20 0441 10/23/20 0449  WBC 12.9* 18.9* 12.0* 10.1  NEUTROABS  --  15.8*  --   --   HGB 12.5 12.9 11.8* 12.1  HCT 37.8 38.0 36.4 37.2  MCV 90.0 89.4 91.0 90.7  PLT 412* 416* 344 330   Cardiac Enzymes: No results for input(s): CKTOTAL, CKMB, CKMBINDEX, TROPONINI in the last 168 hours. BNP: Invalid input(s): POCBNP CBG: No results for input(s): GLUCAP in the last 168 hours. D-Dimer No results for input(s): DDIMER in the last 72 hours. Hgb A1c Recent Labs    10/22/20 0441  HGBA1C 5.7*   Lipid Profile No results for input(s): CHOL, HDL, LDLCALC, TRIG, CHOLHDL, LDLDIRECT in the last 72 hours. Thyroid function studies No results for input(s): TSH, T4TOTAL, T3FREE, THYROIDAB in the last 72 hours.  Invalid input(s): FREET3 Anemia work up No results for input(s): VITAMINB12, FOLATE, FERRITIN, TIBC, IRON, RETICCTPCT in the last 72 hours. Urinalysis  Component Value Date/Time   COLORURINE YELLOW (A) 01/29/2018 0845   APPEARANCEUR CLOUDY (A) 01/29/2018 0845   LABSPEC 1.013 01/29/2018 0845   PHURINE 6.0 01/29/2018 0845   GLUCOSEU NEGATIVE 01/29/2018 0845   HGBUR SMALL (A) 01/29/2018 0845   BILIRUBINUR NEGATIVE 01/29/2018 0845   KETONESUR NEGATIVE 01/29/2018 0845   PROTEINUR NEGATIVE 01/29/2018 0845   UROBILINOGEN 0.2 10/21/2014 0900   NITRITE NEGATIVE 01/29/2018 0845   LEUKOCYTESUR TRACE (A) 01/29/2018 0845   Sepsis Labs Invalid input(s): PROCALCITONIN,  WBC,   LACTICIDVEN Microbiology Recent Results (from the past 240 hour(s))  Resp Panel by RT-PCR (Flu A&B, Covid) Nasopharyngeal Swab     Status: None   Collection Time: 10/19/20  5:30 PM   Specimen: Nasopharyngeal Swab; Nasopharyngeal(NP) swabs in vial transport medium  Result Value Ref Range Status   SARS Coronavirus 2 by RT PCR NEGATIVE NEGATIVE Final    Comment: (NOTE) SARS-CoV-2 target nucleic acids are NOT DETECTED.  The SARS-CoV-2 RNA is generally detectable in upper respiratory specimens during the acute phase of infection. The lowest concentration of SARS-CoV-2 viral copies this assay can detect is 138 copies/mL. A negative result does not preclude SARS-Cov-2 infection and should not be used as the sole basis for treatment or other patient management decisions. A negative result may occur with  improper specimen collection/handling, submission of specimen other than nasopharyngeal swab, presence of viral mutation(s) within the areas targeted by this assay, and inadequate number of viral copies(<138 copies/mL). A negative result must be combined with clinical observations, patient history, and epidemiological information. The expected result is Negative.  Fact Sheet for Patients:  BloggerCourse.com  Fact Sheet for Healthcare Providers:  SeriousBroker.it  This test is no t yet approved or cleared by the [place] FDA and  has been authorized for detection and/or diagnosis of SARS-CoV-2 by FDA under an Emergency Use Authorization (EUA). This EUA will remain  in effect (meaning this test can be used) for the duration of the COVID-19 declaration under Section 564(b)(1) of the Act, 21 U.S.C.section 360bbb-3(b)(1), unless the authorization is terminated  or revoked sooner.       Influenza A by PCR NEGATIVE NEGATIVE Final   Influenza B by PCR NEGATIVE NEGATIVE Final    Comment: (NOTE) The Xpert Xpress SARS-CoV-2/FLU/RSV plus  assay is intended as an aid in the diagnosis of influenza from Nasopharyngeal swab specimens and should not be used as a sole basis for treatment. Nasal washings and aspirates are unacceptable for Xpert Xpress SARS-CoV-2/FLU/RSV testing.  Fact Sheet for Patients: BloggerCourse.com  Fact Sheet for Healthcare Providers: SeriousBroker.it  This test is not yet approved or cleared by the [place] FDA and has been authorized for detection and/or diagnosis of SARS-CoV-2 by FDA under an Emergency Use Authorization (EUA). This EUA will remain in effect (meaning this test can be used) for the duration of the COVID-19 declaration under Section 564(b)(1) of the Act, 21 U.S.C. section 360bbb-3(b)(1), unless the authorization is terminated or revoked.  Performed at [facility], [address] [place], [place] [address]   Culture, blood (routine x 2)     Status: None (Preliminary result)   Collection Time: 10/19/20  9:05 PM   Specimen: BLOOD  Result Value Ref Range Status   Specimen Description BLOOD BLOOD LEFT FOREARM  Final   Special Requests   Final    BOTTLES DRAWN AEROBIC AND ANAEROBIC Blood Culture adequate volume   Culture   Final    NO GROWTH 4 DAYS Performed at  [facility], [address], [place], [place] [address]    Report Status PENDING  Incomplete  Culture, blood (routine x 2)     Status: None (Preliminary result)   Collection Time: 10/19/20  9:10 PM   Specimen: BLOOD  Result Value Ref Range Status   Specimen Description BLOOD RIGHT ANTECUBITAL  Final   Special Requests   Final    BOTTLES DRAWN AEROBIC AND ANAEROBIC Blood Culture adequate volume   Culture   Final    NO GROWTH 4 DAYS Performed at [facility], [address] [place], [place] [address]    Report Status PENDING  Incomplete  MRSA PCR Screening     Status: None   Collection Time: 10/21/20  7:31 AM   Specimen: Nasal  Mucosa; Nasopharyngeal  Result Value Ref Range Status   MRSA by PCR NEGATIVE NEGATIVE Final    Comment:        The GeneXpert MRSA Assay (FDA approved for NASAL specimens only), is one component of a comprehensive MRSA colonization surveillance program. It is not intended to diagnose MRSA infection nor to guide or monitor treatment for MRSA infections. Performed at [facility], [address], [place], [place] [address]      Time coordinating discharge: Over 30 minutes  SIGNED:   Charise Killian, MD  [facility] 10/23/2020, 2:16 PM Pager   If 7PM-7AM, please contact night-coverage

## 2020-10-23 NOTE — TOC Transition Note (Signed)
Transition of Care [facility]) - CM/SW Discharge Note   Patient Details  Name: Nicole Dillon MRN: [id number] Date of Birth: Aug 12, 1995  Transition of Care [facility]) CM/SW Contact:  Bing Quarry, RN Phone Number: 10/23/2020, 12:38 PM   Clinical Narrative:  Patient being discharged today to HOME/Self-Care. Per provider notes, patient deferred LifeVest. Gabriel Cirri RN CM 1240 pm.     Final next level of care: Home/Self Care Barriers to Discharge: Barriers Resolved (Pt. deferred Life Vest per provider notes.)   Patient Goals and CMS Choice        Discharge Placement                       Discharge Plan and Services                DME Arranged: N/A (Deferred LifeVest) DME Agency: NA       HH Arranged: NA HH Agency: NA        Social Determinants of Health (SDOH) Interventions     Readmission Risk Interventions No flowsheet data found.

## 2020-10-23 NOTE — Discharge Instructions (Signed)
Atrial Fibrillation  Atrial fibrillation is a type of heartbeat that is irregular or fast. If you have this condition, your heart beats without any order. This makes it hard for your heart to pump blood in a normal way. Atrial fibrillation may come and go, or it may become a long-lasting problem. If this condition is not treated, it can put you at higher risk for stroke, heart failure, and other heart problems. What are the causes? This condition may be caused by diseases that damage the heart. They include:  High blood pressure.  Heart failure.  Heart valve disease.  Heart surgery. Other causes include:  Diabetes.  Thyroid disease.  Being overweight.  Kidney disease. Sometimes the cause is not known. What increases the risk? You are more likely to develop this condition if:  You are older.  You smoke.  You exercise often and very hard.  You have a family history of this condition.  You are a man.  You use drugs.  You drink a lot of alcohol.  You have lung conditions, such as emphysema, pneumonia, or COPD.  You have sleep apnea. What are the signs or symptoms? Common symptoms of this condition include:  A feeling that your heart is beating very fast.  Chest pain or discomfort.  Feeling short of breath.  Suddenly feeling light-headed or weak.  Getting tired easily during activity.  Fainting.  Sweating. In some cases, there are no symptoms. How is this treated? Treatment for this condition depends on underlying conditions and how you feel when you have atrial fibrillation. They include:  Medicines to: ? Prevent blood clots. ? Treat heart rate or heart rhythm problems.  Using devices, such as a pacemaker, to correct heart rhythm problems.  Doing surgery to remove the part of the heart that sends bad signals.  Closing an area where clots can form in the heart (left atrial appendage). In some cases, your doctor will treat other underlying  conditions. Follow these instructions at home: Medicines  Take over-the-counter and prescription medicines only as told by your doctor.  Do not take any new medicines without first talking to your doctor.  If you are taking blood thinners: ? Talk with your doctor before you take any medicines that have aspirin or NSAIDs, such as ibuprofen, in them. ? Take your medicine exactly as told by your doctor. Take it at the same time each day. ? Avoid activities that could hurt or bruise you. Follow instructions about how to prevent falls. ? Wear a bracelet that says you are taking blood thinners. Or, carry a card that lists what medicines you take. Lifestyle  Do not use any products that have nicotine or tobacco in them. These include cigarettes, e-cigarettes, and chewing tobacco. If you need help quitting, ask your doctor.  Eat heart-healthy foods. Talk with your doctor about the right eating plan for you.  Exercise regularly as told by your doctor.  Do not drink alcohol.  Lose weight if you are overweight.  Do not use drugs, including cannabis.      General instructions  If you have a condition that causes breathing to stop for a short period of time (apnea), treat it as told by your doctor.  Keep a healthy weight. Do not use diet pills unless your doctor says they are safe for you. Diet pills may make heart problems worse.  Keep all follow-up visits as told by your doctor. This is important. Contact a doctor if:  You notice a   change in the speed, rhythm, or strength of your heartbeat.  You are taking a blood-thinning medicine and you get more bruising.  You get tired more easily when you move or exercise.  You have a sudden change in weight. Get help right away if:  You have pain in your chest or your belly (abdomen).  You have trouble breathing.  You have side effects of blood thinners, such as blood in your vomit, poop (stool), or pee (urine), or bleeding that cannot  stop.  You have any signs of a stroke. "BE FAST" is an easy way to remember the main warning signs: ? B - Balance. Signs are dizziness, sudden trouble walking, or loss of balance. ? E - Eyes. Signs are trouble seeing or a change in how you see. ? F - Face. Signs are sudden weakness or loss of feeling in the face, or the face or eyelid drooping on one side. ? A - Arms. Signs are weakness or loss of feeling in an arm. This happens suddenly and usually on one side of the body. ? S - Speech. Signs are sudden trouble speaking, slurred speech, or trouble understanding what people say. ? T - Time. Time to call emergency services. Write down what time symptoms started.  You have other signs of a stroke, such as: ? A sudden, very bad headache with no known cause. ? Feeling like you may vomit (nausea). ? Vomiting. ? A seizure. These symptoms may be an emergency. Do not wait to see if the symptoms will go away. Get medical help right away. Call your local emergency services (911 in the [place] Do not drive yourself to the hospital.   Summary  Atrial fibrillation is a type of heartbeat that is irregular or fast.  You are at higher risk of this condition if you smoke, are older, have diabetes, or are overweight.  Follow your doctor's instructions about medicines, diet, exercise, and follow-up visits.  Get help right away if you have signs or symptoms of a stroke.  Get help right away if you cannot catch your breath, or you have chest pain or discomfort. This information is not intended to replace advice given to you by your health care provider. Make sure you discuss any questions you have with your health care provider. Document Revised: 11/27/2018 Document Reviewed: 11/27/2018 Elsevier Patient Education  2021 [employer]    Community-Acquired Pneumonia, Adult Pneumonia is an infection of the lungs. It causes irritation and swelling in the airways of the lungs. Mucus and fluid may also build  up inside the airways. This may cause coughing and trouble breathing. One type of pneumonia can happen while you are in a hospital. A different type can happen when you are not in a hospital (community-acquired pneumonia). What are the causes? This condition is caused by germs (viruses, bacteria, or fungi). Some types of germs can spread from person to person. Pneumonia is not thought to spread from person to person.   What increases the risk? You are more likely to develop this condition if:  You have a long-term (chronic) disease, such as: ? Disease of the lungs. This may be chronic obstructive pulmonary disease (COPD) or asthma. ? Heart failure. ? Cystic fibrosis. ? Diabetes. ? Kidney disease. ? Sickle cell disease. ? HIV.  You have other health problems, such as: ? Your body's defense system (immune system) is weak. ? A condition that may cause you to breathe in fluids from your mouth and nose.    You had your spleen taken out.  You do not take good care of your teeth and mouth (poor dental hygiene).  You use or have used tobacco products.  You travel where the germs that cause this illness are common.  You are near certain animals or the places they live.  You are older than 26 years of age. What are the signs or symptoms? Symptoms of this condition include:  A cough.  A fever.  Sweating or chills.  Chest pain, often when you breathe deeply or cough.  Breathing problems, such as: ? Fast breathing. ? Trouble breathing. ? Shortness of breath.  Feeling tired (fatigued).  Muscle aches. How is this treated? Treatment for this condition depends on many things, such as:  The cause of your illness.  Your medicines.  Your other health problems. Most adults can be treated at home. Sometimes, treatment must happen in a hospital.  Treatment may include medicines to kill germs.  Medicines may depend on which germ caused your illness. Very bad pneumonia is rare. If  you get it, you may:  Have a machine to help you breathe.  Have fluid taken away from around your lungs. Follow these instructions at home: Medicines  Take over-the-counter and prescription medicines only as told by your doctor.  Take cough medicine only if you are losing sleep. Cough medicine can keep your body from taking mucus away from your lungs.  If you were prescribed an antibiotic medicine, take it as told by your doctor. Do not stop taking the antibiotic even if you start to feel better. Lifestyle  Do not drink alcohol.  Do not use any products that contain nicotine or tobacco, such as cigarettes, e-cigarettes, and chewing tobacco. If you need help quitting, ask your doctor.  Eat a healthy diet. This includes a lot of vegetables, fruits, whole grains, low-fat dairy products, and low-fat (lean) protein.      General instructions  Rest a lot. Sleep for at least 8 hours each night.  Sleep with your head and neck raised. Put a few pillows under your head or sleep in a reclining chair.  Return to your normal activities as told by your doctor. Ask your doctor what activities are safe for you.  Drink enough fluid to keep your pee (urine) pale yellow.  If your throat is sore, rinse your mouth often with salt water. To make salt water, dissolve -1 tsp (3-6 g) of salt in 1 cup (237 mL) of warm water.  Keep all follow-up visits as told by your doctor. This is important.   How is this prevented? You can lower your risk of pneumonia by:  Getting the pneumonia shot (vaccine). These shots have different types and schedules. Ask your doctor what works best for you. Think about getting this shot if: ? You are older than 26 years of age. ? You are 19-65 years of age and:  You are being treated for cancer.  You have long-term lung disease.  You have other problems that affect your body's defense system. Ask your doctor if you have one of these.  Getting your flu shot every  year. Ask your doctor which type of shot is best for you.  Going to the dentist as often as told.  Washing your hands often with soap and water for at least 20 seconds. If you cannot use soap and water, use hand sanitizer. Contact a doctor if:  You have a fever.  You lose sleep because your cough   medicine does not help. Get help right away if:  You are short of breath and this gets worse.  You have more chest pain.  Your sickness gets worse. This is very serious if: ? You are an older adult. ? Your body's defense system is weak.  You cough up blood. These symptoms may be an emergency. Do not wait to see if the symptoms will go away. Get medical help right away. Call your local emergency services (911 in the [place] Do not drive yourself to the hospital. Summary  Pneumonia is an infection of the lungs.  Community-acquired pneumonia affects people who have not been in the hospital. Certain germs can cause this infection.  This condition may be treated with medicines that kill germs.  For very bad pneumonia, you may need a hospital stay and treatment to help with breathing. This information is not intended to replace advice given to you by your health care provider. Make sure you discuss any questions you have with your health care provider. Document Revised: 03/18/2019 Document Reviewed: 03/18/2019 Elsevier Patient Education  2021 [employer]  

## 2020-10-23 NOTE — Progress Notes (Signed)
Patient Name: Nicole Dillon Date of Encounter: 10/23/2020  Hospital Problem List     Principal Problem:   Bilateral pneumonia Active Problems:   Morbid obesity with BMI of 50.0-59.9, adult (HCC)   Asthma, mild intermittent   Tachyarrhythmia    Patient Profile     25 year old female with asthma, morbid obesity, irregular heartbeat and chronic tachycardia who presented to the emergency room with increasing shortness of breath and noted to be have tachycardia which was narrow complex.  Felt to have pneumonia has been placed on Rocephin and Zithromax.  Underwent echocardiogram which revealed significantly reduced LV function with EF approximately less than 20% with global hypokinesis LV dilatation.  Etiology unclear.  Has been placed on afterload reduction beta-blocker therapy.  Placed on amiodarone for A. fib.  He is currently on amiodarone 200 twice daily carvedilol 3.125 mg twice daily digoxin 0.125 mg daily and Entresto one half of 24-26 mg tablet twice daily.  Chest x-ray and CT suggested pneumonia no CHF.  Renal function is normal.  She says she has had problems with tachycardia and irregular heartbeat for some time.  Etiology of her cardiomyopathy is still unclear.  Coronary disease is unlikely at her age.  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  Discussed LifeVest with her.  She defers.. Subjective   Feels better, anxious to go home.  Inpatient Medications    . amiodarone  200 mg Oral BID  . azithromycin  500 mg Oral Daily  . carvedilol  3.125 mg Oral BID WC  . digoxin  0.125 mg Oral Daily  . enoxaparin (LOVENOX) injection  0.5 mg/kg Subcutaneous Q24H  . mometasone-formoterol  2 puff Inhalation BID  . montelukast  10 mg Oral QHS  . sacubitril-valsartan  0.5 tablet Oral BID    Vital Signs    Vitals:   10/22/20 1637 10/22/20 2000  10/23/20 0400 10/23/20 0754  BP: 105/87 125/72 93/64 136/88  Pulse: (!) 108 (!) 109 (!) 108 (!) 109  Resp: (!) 21 20 20  (!) 23  Temp: 98.7 F (37.1 C) 99.2 F (37.3 C) 99 F (37.2 C) 98.7 F (37.1 C)  TempSrc: Oral Oral Oral Oral  SpO2:  97% 100% 97%  Weight:      Height:        Intake/Output Summary (Last 24 hours) at 10/23/2020 1029 Last data filed at 10/23/2020 0940 Gross per 24 hour  Intake 1560 ml  Output 801 ml  Net 759 ml   Filed Weights   10/19/20 1707 10/20/20 0700 10/21/20 0900  Weight: (!) 158.8 kg (!) 166.8 kg (!) 168.1 kg    Physical Exam    GEN: Well nourished, well developed, in no acute distress.  HEENT: normal.  Neck: Supple, no JVD, carotid bruits, or masses. Cardiac: RRR, no murmurs, rubs, or gallops. No clubbing, cyanosis, edema.  Radials/DP/PT 2+ and equal bilaterally.  Respiratory:  Respirations regular and unlabored, clear to auscultation bilaterally. GI: Soft, nontender, nondistended, BS + x 4. MS: no deformity or atrophy. Skin: warm and dry, no rash. Neuro:  Strength and sensation are intact. Psych: Normal affect.  Labs    CBC Recent Labs    10/21/20 0331 10/22/20 0441 10/23/20 0449  WBC 18.9* 12.0* 10.1  NEUTROABS 15.8*  --   --   HGB 12.9 11.8* 12.1  HCT 38.0 36.4 37.2  MCV 89.4 91.0 90.7  PLT 416* 344 330   Basic Metabolic Panel Recent Labs    16/03/9604/06/22 0441 10/23/20 0449  NA 138 137  K 3.8 3.8  CL 108 107  CO2 23 23  GLUCOSE 104* 81  BUN 11 8  CREATININE 0.94 0.81  CALCIUM 8.0* 8.1*  MG 1.9 1.9   Liver Function Tests No results for input(s): AST, ALT, ALKPHOS, BILITOT, PROT, ALBUMIN in the last 72 hours. No results for input(s): LIPASE, AMYLASE in the last 72 hours. Cardiac Enzymes No results for input(s): CKTOTAL, CKMB, CKMBINDEX, TROPONINI in the last 72 hours. BNP Recent Labs    10/22/20 1139  BNP 557.6*   D-Dimer No results for input(s): DDIMER in the last 72 hours. Hemoglobin A1C Recent Labs     10/22/20 0441  HGBA1C 5.7*   Fasting Lipid Panel No results for input(s): CHOL, HDL, LDLCALC, TRIG, CHOLHDL, LDLDIRECT in the last 72 hours. Thyroid Function Tests No results for input(s): TSH, T4TOTAL, T3FREE, THYROIDAB in the last 72 hours.  Invalid input(s): FREET3  Telemetry    Sinus tachycardia  ECG    Sinus tachycardia/atrial flutter  Radiology    CT Angio Chest PE W and/or Wo Contrast  Result Date: 10/19/2020 CLINICAL DATA:  Difficulty breathing. EXAM: CT ANGIOGRAPHY CHEST WITH CONTRAST TECHNIQUE: Multidetector CT imaging of the chest was performed using the standard protocol during bolus administration of intravenous contrast. Multiplanar CT image reconstructions and MIPs were obtained to evaluate the vascular anatomy. CONTRAST:  75mL OMNIPAQUE IOHEXOL 350 MG/ML SOLN COMPARISON:  None. FINDINGS: Cardiovascular: Satisfactory opacification of the pulmonary arteries to the segmental level. No evidence of pulmonary embolism. Normal heart size. No pericardial effusion. Mediastinum/Nodes: No enlarged mediastinal, hilar, or axillary lymph nodes. Thyroid gland, trachea, and esophagus demonstrate no significant findings. Lungs/Pleura: A 1.0 cm noncalcified lung nodule is seen within the anterolateral aspect of the right apex (axial CT image 18, CT series number 6). Moderate severity patchy infiltrates are seen within the bilateral lower lobes. Small bilateral pleural effusions are noted. No pneumothorax is identified. Upper Abdomen: No acute abnormality. Musculoskeletal: No chest wall abnormality. No acute or significant osseous findings. Review of the MIP images confirms the above findings. IMPRESSION: 1. Moderate severity bilateral lower lobe infiltrates. 2. Small bilateral pleural effusions. 3. 1.0 cm noncalcified right apical lung nodule. Consider one of the following in 3 months for both low-risk and high-risk individuals: (a) repeat chest CT, (b) follow-up PET-CT, or (c) tissue sampling.  This recommendation follows the consensus statement: Guidelines for Management of Incidental Pulmonary Nodules Detected on CT Images: From the Fleischner Society 2017; Radiology 2017; 284:228-243. Electronically Signed   By: Aram Candelahaddeus  Houston M.D.   On: 10/19/2020 20:47   DG Chest Portable 1 View  Result Date: 10/19/2020 CLINICAL DATA:  Shortness of breath EXAM: PORTABLE CHEST 1 VIEW COMPARISON:  02/12/2020 FINDINGS: Mild cardiomegaly. No focal opacity or pleural effusion. No pneumothorax IMPRESSION: No active disease.  Mild cardiomegaly Electronically Signed   By: Jasmine PangKim  Fujinaga M.D.   On: 10/19/2020 17:53   ECHOCARDIOGRAM COMPLETE  Result Date: 10/21/2020    ECHOCARDIOGRAM REPORT   Patient Name:   Lenard ForthAMIKA Dillon Date of Exam: 10/21/2020 Medical Rec #:  [id number]      Height:       66.0 in Accession #:    [id number]     Weight:       370.6 lb Date of Birth:  10/12/1995       BSA:  2.599 m Patient Age:    24 years       BP:           Not listed in chart /Not listed in                                              chart mmHg Patient Gender: F              HR:           Not listed in chart bpm. Exam Location:  [facility] Procedure: 2D Echo, Cardiac Doppler and Color Doppler Indications:     Atrial Fibrillation I48.91  History:         Patient has no prior history of Echocardiogram examinations.  Sonographer:     Cristela Blue RDCS (AE) Referring Phys:  [id number] [facility] D [place] Diagnosing Phys: Harold Hedge MD  Sonographer Comments: Suboptimal apical window and no subcostal window. IMPRESSIONS  1. Left ventricular ejection fraction, by estimation, is <20%. The left ventricle has severely decreased function. The left ventricle demonstrates global hypokinesis. The left ventricular internal cavity size was mildly dilated. Left ventricular diastolic function could not be evaluated.  2. Right ventricular systolic function was not well visualized. The right ventricular size is mildly enlarged.  3. Moderate pleural effusion  in the left lateral region.  4. The mitral valve was not well visualized. Mild mitral valve regurgitation.  5. The aortic valve was not well visualized. Aortic valve regurgitation is trivial. FINDINGS  Left Ventricle: Left ventricular ejection fraction, by estimation, is <20%. The left ventricle has severely decreased function. The left ventricle demonstrates global hypokinesis. The left ventricular internal cavity size was mildly dilated. There is no  left ventricular hypertrophy. Left ventricular diastolic function could not be evaluated. Right Ventricle: The right ventricular size is mildly enlarged. Right vetricular wall thickness was not well visualized. Right ventricular systolic function was not well visualized. Left Atrium: Left atrial size was normal in size. Right Atrium: Right atrial size was normal in size. Pericardium: There is no evidence of pericardial effusion. Mitral Valve: The mitral valve was not well visualized. Mild mitral valve regurgitation. Tricuspid Valve: The tricuspid valve is grossly normal. Tricuspid valve regurgitation is trivial. Aortic Valve: The aortic valve was not well visualized. Aortic valve regurgitation is trivial. Aortic valve mean gradient measures 2.0 mmHg. Aortic valve peak gradient measures 4.0 mmHg. Aortic valve area, by VTI measures 2.22 cm. Pulmonic Valve: The pulmonic valve was not well visualized. Pulmonic valve regurgitation is trivial. Aorta: The aortic root was not well visualized. IAS/Shunts: The interatrial septum was not assessed. Additional Comments: There is a moderate pleural effusion in the left lateral region.  LEFT VENTRICLE PLAX 2D LVIDd:         5.72 cm LVIDs:         5.39 cm LV PW:         1.00 cm LV IVS:        0.91 cm LVOT diam:     2.20 cm LV SV:         34 LV SV Index:   13 LVOT Area:     3.80 cm  LV Volumes (MOD) LV vol d, MOD A4C: 179.0 ml LV vol s, MOD A4C: 164.0 ml LV SV MOD A4C:     179.0 ml RIGHT VENTRICLE RV Basal diam:  4.03  cm RV S prime:      7.94 cm/s LEFT ATRIUM           Index       RIGHT ATRIUM           Index LA diam:      3.90 cm 1.50 cm/m  RA Area:     19.80 cm LA Vol (A4C): 52.9 ml 20.35 ml/m RA Volume:   60.30 ml  23.20 ml/m  AORTIC VALVE                   PULMONIC VALVE AV Area (Vmax):    2.03 cm    PV Vmax:        0.73 m/s AV Area (Vmean):   2.09 cm    PV Peak grad:   2.1 mmHg AV Area (VTI):     2.22 cm    RVOT Peak grad: 3 mmHg AV Vmax:           100.00 cm/s AV Vmean:          71.400 cm/s AV VTI:            0.152 m AV Peak Grad:      4.0 mmHg AV Mean Grad:      2.0 mmHg LVOT Vmax:         53.50 cm/s LVOT Vmean:        39.200 cm/s LVOT VTI:          0.089 m LVOT/AV VTI ratio: 0.58  AORTA Ao Root diam: 2.40 cm MITRAL VALVE               TRICUSPID VALVE MV Area (PHT): 15.80 cm   TR Peak grad:   32.7 mmHg MV Decel Time: 48 msec     TR Vmax:        286.00 cm/s MV E velocity: 99.00 cm/s                            SHUNTS                            Systemic VTI:  0.09 m                            Systemic Diam: 2.20 cm Harold Hedge MD Electronically signed by Harold Hedge MD Signature Date/Time: 10/21/2020/3:18:47 PM    Final     Assessment & Plan     25 year old female with asthma, morbid obesity, irregular heartbeat and chronic tachycardia who presented to the emergency room with increasing shortness of breath and noted to be have tachycardia which was narrow complex.  Felt to have pneumonia has been placed on Rocephin and Zithromax.  Underwent echocardiogram which revealed significantly reduced LV function with EF approximately less than 20% with global hypokinesis LV dilatation.  Etiology unclear.  Has been placed on afterload reduction beta-blocker therapy.  Placed on amiodarone for A. fib.  He is currently on amiodarone 200 twice daily carvedilol 3.125 mg twice daily digoxin 0.125 mg daily and Entresto one half of 24-26 mg tablet twice daily.  Chest x-ray and CT suggested pneumonia no CHF.  Renal function is normal.  She says  she has had problems with tachycardia and irregular heartbeat for some time.  Etiology of her cardiomyopathy is still unclear.  Coronary disease is unlikely at her age.  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  Discussed LifeVest with her.  She defers.  1.  Pneumonia-on antibiotics.  Appears stable.  Would agree with oral antibiotics postdischarge.  2.  Cardiomyopathy-as per above, etiology is unclear.  She currently is on low-dose carvedilol, Entresto, digoxin.  On amiodarone for atrial flutter.  We did lease continue with this for now even though she has a young age as work-up of her cardiomyopathy of continues.  Discussed LifeVest with her.  She defers at this point.  I think it is okay to leave it off.  We will do a low-sodium diet and close outpatient follow-up.  Okay for discharge on carvedilol at 3.125 twice daily Entresto at current dose and digoxin along with amiodarone.  3.  Atrial flutter-appears to be in sinus tachycardia on amiodarone.  We will continue with amiodarone and carvedilol for now.  For anticoagulation.  CHA2DS2-VASc score is 42 female gender and 1 for cardiomyopathy.  Okay for discharge from cardiac standpoint on meds as discussed above.  Early follow-up with Dr. Juliann Pares in 1 week.   Signed, Darlin Priestly Clarrisa Kaylor MD 10/23/2020, 10:29 AM  Pager: [phone]

## 2020-10-24 LAB — CULTURE, BLOOD (ROUTINE X 2)
Culture: NO GROWTH
Culture: NO GROWTH
Special Requests: ADEQUATE
Special Requests: ADEQUATE

## 2021-04-25 ENCOUNTER — Other Ambulatory Visit (HOSPITAL_COMMUNITY): Payer: Self-pay | Admitting: Physician Assistant

## 2021-04-25 ENCOUNTER — Other Ambulatory Visit: Payer: Self-pay | Admitting: Physician Assistant

## 2021-04-25 DIAGNOSIS — R918 Other nonspecific abnormal finding of lung field: Secondary | ICD-10-CM

## 2021-04-26 ENCOUNTER — Other Ambulatory Visit: Payer: Self-pay | Admitting: Physician Assistant

## 2021-04-26 DIAGNOSIS — Z30432 Encounter for removal of intrauterine contraceptive device: Secondary | ICD-10-CM

## 2022-10-09 ENCOUNTER — Emergency Department (HOSPITAL_COMMUNITY)
Admission: EM | Admit: 2022-10-09 | Discharge: 2022-10-09 | Disposition: A | Discharge status: Home / Self Care | Payer: Medicaid Other | Attending: Emergency Medicine | Admitting: Emergency Medicine

## 2022-10-09 ENCOUNTER — Other Ambulatory Visit: Payer: Self-pay

## 2022-10-09 ENCOUNTER — Encounter (HOSPITAL_COMMUNITY): Payer: Self-pay

## 2022-10-09 DIAGNOSIS — K0889 Other specified disorders of teeth and supporting structures: Secondary | ICD-10-CM | POA: Diagnosis present

## 2022-10-09 DIAGNOSIS — X58XXXA Exposure to other specified factors, initial encounter: Secondary | ICD-10-CM | POA: Insufficient documentation

## 2022-10-09 DIAGNOSIS — S025XXA Fracture of tooth (traumatic), initial encounter for closed fracture: Secondary | ICD-10-CM

## 2022-10-09 DIAGNOSIS — K029 Dental caries, unspecified: Secondary | ICD-10-CM | POA: Diagnosis not present

## 2022-10-09 MED ORDER — AMOXICILLIN-POT CLAVULANATE 875-125 MG PO TABS: 1.0000 | ORAL_TABLET | Freq: Two times a day (BID) | ORAL | 0 refills | Status: DC

## 2022-10-09 MED ORDER — IBUPROFEN 600 MG PO TABS: 600.0000 mg | ORAL_TABLET | Freq: Three times a day (TID) | ORAL | 0 refills | Status: AC | PRN

## 2022-10-09 MED ORDER — IBUPROFEN 600 MG PO TABS: 600.0000 mg | ORAL_TABLET | Freq: Three times a day (TID) | ORAL | 0 refills | Status: DC | PRN

## 2022-10-09 MED ORDER — AMOXICILLIN-POT CLAVULANATE 875-125 MG PO TABS: 1.0000 | ORAL_TABLET | Freq: Two times a day (BID) | ORAL | 0 refills | Status: AC

## 2022-10-09 NOTE — ED Provider Notes (Signed)
[facility] EMERGENCY DEPARTMENT AT [facility] Provider Note   CSN: [id number] Arrival date & time: 10/09/22  4098     History  Chief Complaint  Patient presents with   Dental Pain    Nicole Dillon is a 27 y.o. female who presents to the ED complaining of right upper and lower dental pain that started early this morning.  Last saw dentist several years ago.  No associated shortness of breath, difficulty swallowing, fever, chills, nausea, vomiting, or other symptoms.      Home Medications Prior to Admission medications   Medication Sig Start Date End Date Taking? Authorizing Provider  amiodarone (PACERONE) 200 MG tablet Take 1 tablet (200 mg total) by mouth 2 (two) times daily. 10/23/20 11/22/20  Charise Killian, MD  amoxicillin-clavulanate (AUGMENTIN) 875-125 MG tablet Take 1 tablet by mouth every 12 (twelve) hours for 7 days. 10/09/22 10/16/22  Archer Moist L, PA-C  carvedilol (COREG) 3.125 MG tablet Take 1 tablet (3.125 mg total) by mouth 2 (two) times daily with a meal. 10/23/20 11/22/20  Charise Killian, MD  digoxin (LANOXIN) 0.125 MG tablet Take 1 tablet (0.125 mg total) by mouth daily. 10/24/20 11/23/20  Charise Killian, MD  guaiFENesin (MUCINEX) 600 MG 12 hr tablet Take 600 mg by mouth 2 (two) times daily.    [provider]  ibuprofen (ADVIL) 600 MG tablet Take 1 tablet (600 mg total) by mouth every 8 (eight) hours as needed for up to 3 days for mild pain or moderate pain. 10/09/22 10/12/22  Alacia Rehmann, Lawrence Marseilles, PA-C      Allergies    Patient has no known allergies.    Review of Systems   Review of Systems  All other systems reviewed and are negative.   Physical Exam Updated Vital Signs BP 120/84 (BP Location: Right Arm)   Pulse 96   Temp 98.1 F (36.7 C) (Oral)   Resp 16   Ht  (1.651 m)   Wt (!) 165.6 kg   SpO2 100%   BMI 60.74 kg/m  Physical Exam Vitals and nursing note reviewed.  Constitutional:      General: She is not in acute  distress.    Appearance: Normal appearance.  HENT:     Head: Normocephalic and atraumatic.     Mouth/Throat:     Mouth: Mucous membranes are moist. No angioedema.     Tongue: Tongue does not deviate from midline.     Pharynx: Oropharynx is clear. Uvula midline. No uvula swelling.     Tonsils: No tonsillar exudate or tonsillar abscesses.      Comments: Large dental caries as above, tooth in black with associated closed fracture, mild surrounding gingival erythema but no induration, fluctuance, or signs of obvious abscess; no submental, sublingual, or submandibular tenderness, induration, or fluctuance; widely patent airway, tolerating secretions and swallowing with ease and without signs of distress Eyes:     Conjunctiva/sclera: Conjunctivae normal.  Cardiovascular:     Rate and Rhythm: Normal rate and regular rhythm.     Heart sounds: No murmur heard. Pulmonary:     Effort: Pulmonary effort is normal.     Breath sounds: Normal breath sounds.  Abdominal:     General: Abdomen is flat.     Palpations: Abdomen is soft.  Musculoskeletal:        General: Normal range of motion.     Cervical back: Normal range of motion and neck supple. No rigidity.     Right  lower leg: No edema.     Left lower leg: No edema.  Skin:    General: Skin is warm and dry.     Capillary Refill: Capillary refill takes less than 2 seconds.  Neurological:     Mental Status: She is alert. Mental status is at baseline.  Psychiatric:        Behavior: Behavior normal.     ED Results / Procedures / Treatments   Labs (all labs ordered are listed, but only abnormal results are displayed) Labs Reviewed - No data to display  EKG None  Radiology No results found.  Procedures Procedures    Medications Ordered in ED Medications - No data to display  ED Course/ Medical Decision Making/ A&P                             Medical Decision Making Risk Prescription drug management.   Medical Decision  Making:   [name] is a 27 y.o. female who presented to the ED today with dental pain detailed above.    Complete initial physical exam performed, notably the patient was HDS in no acute distress. Large dental caries and dental fracture as above. No signs Ludwig's angina. No signs respiratory distress.  No obvious intraoral lesions or swelling. Poor dentition diffusely    Reviewed and confirmed nursing documentation for past medical history, family history, social history.    Initial Assessment:   With the patient's presentation of dental pain, most likely diagnosis is reversible vs irreversible pulpitis. Other diagnoses were considered including (but not limited to) ludwig's angina, osteitis, dental abscess, PTA, RPA. These are considered less likely due to history of present illness and physical exam findings.   This is most consistent with an acute complicated illness  Initial Plan:  Symptomatic management with NSAIDS/Tylenol Due to clinical overlap with potentially reversible pulpitis, antibiotics prescribed Patient will need definitive management with dentistry. Provided low cost/local dental resource chart provided by social work.  Disposition:  I have considered need for hospitalization, however, considering all of the above, I believe this patient is stable for discharge at this time.  Patient/family educated about specific return precautions for given chief complaint and symptoms.  Patient/family educated about follow-up with PCP and dentistry.  Patient aware that ultimately she will need to follow-up with dentistry for definitive management.  Provided on-call dentist and instructed patient to call to schedule appointment within 48 hours.  Patient/family expressed understanding of return precautions and need for follow-up. Patient spoken to regarding all findings and appropriate follow up for these results. All education provided in verbal form with additional information in  written form. Time was allowed for answering of patient questions. Patient discharged.              Final Clinical Impression(s) / ED Diagnoses Final diagnoses:  Dental caries  Dentalgia  Closed fracture of tooth, initial encounter    Rx / DC Orders ED Discharge Orders          Ordered    amoxicillin-clavulanate (AUGMENTIN) 875-125 MG tablet  Every 12 hours,   Status:  Discontinued        10/09/22 0747    ibuprofen (ADVIL) 600 MG tablet  Every 8 hours PRN,   Status:  Discontinued        10/09/22 0747    amoxicillin-clavulanate (AUGMENTIN) 875-125 MG tablet  Every 12 hours        10/09/22 0754  ibuprofen (ADVIL) 600 MG tablet  Every 8 hours PRN        10/09/22 0754              Tonette Lederer, PA-C 10/09/22 0758    Wynetta Fines, MD 10/09/22 [phone]

## 2022-10-09 NOTE — Discharge Instructions (Addendum)
Thank you for letting us take care of you today.  You have multiple cavities and a broken tooth on the right.  I am prescribing antibiotics to help treat this as well as NSAIDs for pain.  Please take the antibiotics as prescribed to help clear up any infection.  You will need to follow-up with a dentist as discussed.  I provided the information for dentist on-call.  Please call within 48 hours and schedule a follow-up appointment.  If you develop any new or worsening symptoms such as difficulty swallowing, difficulty breathing, high fevers, significant throat or neck swelling, or other new, concerning symptoms, please return to the nearest emergency department for reevaluation.

## 2022-10-09 NOTE — ED Triage Notes (Signed)
Patient said it feels like a baby is coming out of her tooth. Her tooth hurts on the right side, but she does not know if its upper or lower tooth. Has a cavity but does not see a dentist.

## 2023-01-29 ENCOUNTER — Encounter: Payer: Self-pay | Admitting: Family

## 2023-02-06 ENCOUNTER — Encounter: Payer: Medicaid Other | Admitting: Family

## 2023-02-06 NOTE — Progress Notes (Signed)
Erroneous encounter-disregard

## 2023-03-21 ENCOUNTER — Ambulatory Visit: Payer: Medicaid Other | Admitting: Family Medicine

## 2023-03-21 VITALS — BP 120/82 | HR 105 | Temp 98.2°F | Resp 16 | Ht 65.0 in | Wt 338.4 lb

## 2023-03-21 DIAGNOSIS — Z6841 Body Mass Index (BMI) 40.0 and over, adult: Secondary | ICD-10-CM

## 2023-03-21 DIAGNOSIS — Z23 Encounter for immunization: Secondary | ICD-10-CM | POA: Diagnosis not present

## 2023-03-21 DIAGNOSIS — F32A Depression, unspecified: Secondary | ICD-10-CM | POA: Diagnosis not present

## 2023-03-21 DIAGNOSIS — Z3009 Encounter for other general counseling and advice on contraception: Secondary | ICD-10-CM

## 2023-03-21 DIAGNOSIS — F1290 Cannabis use, unspecified, uncomplicated: Secondary | ICD-10-CM | POA: Diagnosis not present

## 2023-03-21 DIAGNOSIS — E66813 Obesity, class 3: Secondary | ICD-10-CM | POA: Diagnosis not present

## 2023-03-21 DIAGNOSIS — F419 Anxiety disorder, unspecified: Secondary | ICD-10-CM

## 2023-03-21 DIAGNOSIS — Z7689 Persons encountering health services in other specified circumstances: Secondary | ICD-10-CM

## 2023-03-21 NOTE — Progress Notes (Signed)
New Patient Office Visit  Subjective    Patient ID: Nicole Dillon, female    DOB: 1996-02-01  Age: 27 y.o. MRN: [id number]  CC:  Chief Complaint  Patient presents with   Establish Care    Patient is here to established care with provider. ~health hx address ~care gaps address      HPI Nicole Dillon presents to establish care and for review of chronic med issues. Patient would like to change birth control to IUD.    Outpatient Encounter Medications as of 03/21/2023  Medication Sig   guaiFENesin (MUCINEX) 600 MG 12 hr tablet Take 600 mg by mouth 2 (two) times daily.   amiodarone (PACERONE) 200 MG tablet Take 1 tablet (200 mg total) by mouth 2 (two) times daily.   carvedilol (COREG) 3.125 MG tablet Take 1 tablet (3.125 mg total) by mouth 2 (two) times daily with a meal.   digoxin (LANOXIN) 0.125 MG tablet Take 1 tablet (0.125 mg total) by mouth daily.   No facility-administered encounter medications on file as of 03/21/2023.    Past Medical History:  Diagnosis Date   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activity    Asthma    Insomnia    Irregular heart beat    Seasonal allergies     Past Surgical History:  Procedure Laterality Date   TOOTH EXTRACTION      Family History  Problem Relation Age of Onset   Diabetes Mother    Diabetes Father    Kidney disease Father        dialysis   Learning disabilities Maternal Uncle        speach impediment   Heart disease Maternal Grandmother    Heart disease Maternal Grandfather     Social History   Socioeconomic History   Marital status: Single    Spouse name: Not on file   Number of children: Not on file   Years of education: Not on file   Highest education level: 12th grade  Occupational History   Not on file  Tobacco Use   Smoking status: Light Smoker    Current packs/day: 0.00    Types: Cigarettes    Last attempt to quit: 03/10/2014    Years since quitting: 9.0   Smokeless tobacco: Never  Substance  and Sexual Activity   Alcohol use: No   Drug use: No   Sexual activity: Yes    Birth control/protection: Condom  Other Topics Concern   Not on file  Social History Narrative   Not on file   Social Determinants of Health   Financial Resource Strain: High Risk (03/21/2023)   Overall Financial Resource Strain (CARDIA)    Difficulty of Paying Living Expenses: Hard  Food Insecurity: Food Insecurity Present (03/21/2023)   Hunger Vital Sign    Worried About Running Out of Food in the Last Year: Sometimes true    Ran Out of Food in the Last Year: Sometimes true  Transportation Needs: Unmet Transportation Needs (03/21/2023)   PRAPARE - Administrator, Civil Service (Medical): Yes    Lack of Transportation (Non-Medical): Yes  Physical Activity: Insufficiently Active (03/21/2023)   Exercise Vital Sign    Days of Exercise per Week: 1 day    Minutes of Exercise per Session: 10 min  Stress: Stress Concern Present (03/21/2023)   [employer] of Occupational Health - Occupational Stress Questionnaire    Feeling of Stress : Rather much  Social Connections: Moderately Isolated (03/21/2023)   Social  Connection and Isolation Panel [NHANES]    Frequency of Communication with Friends and Family: More than three times a week    Frequency of Social Gatherings with Friends and Family: Once a week    Attends Religious Services: 1 to 4 times per year    Active Member of [employer] or Organizations: No    Attends Engineer, structural: Not on file    Marital Status: Never married  Intimate Partner Violence: Not on file    Review of Systems  All other systems reviewed and are negative.       Objective    BP 120/82   Pulse (!) 105   Temp 98.2 F (36.8 C) (Oral)   Resp 16   Ht 5\' 5"  (1.651 m)   Wt (!) 338 lb 6.4 oz (153.5 kg)   SpO2 96%   BMI 56.31 kg/m   Physical Exam Vitals and nursing note reviewed.  Constitutional:      General: She is not in acute distress.     Appearance: She is obese.  Cardiovascular:     Rate and Rhythm: Normal rate and regular rhythm.  Pulmonary:     Effort: Pulmonary effort is normal.     Breath sounds: Normal breath sounds.  Abdominal:     Palpations: Abdomen is soft.     Tenderness: There is no abdominal tenderness.  Neurological:     General: No focal deficit present.     Mental Status: She is alert and oriented to person, place, and time.  Psychiatric:        Mood and Affect: Mood normal.        Behavior: Behavior normal.         Assessment & Plan:   1. Encounter for other general counseling or advice on contraception Referral for IUD - Ambulatory referral to Gynecology  2. Anxiety and depression   3. Marijuana use   4. Class 3 severe obesity due to excess calories with serious comorbidity and body mass index (BMI) of 50.0 to 59.9 in adult (HCC)   5. Encounter for immunization  - Flu vaccine trivalent PF, 6mos and older(Flulaval,Afluria,Fluarix,Fluzone)  6. Encounter to establish care      No follow-ups on file.   Tommie Raymond, MD

## 2023-03-23 ENCOUNTER — Encounter: Payer: Self-pay | Admitting: Family Medicine

## 2023-03-28 ENCOUNTER — Ambulatory Visit: Payer: Medicaid Other

## 2023-03-28 DIAGNOSIS — Z111 Encounter for screening for respiratory tuberculosis: Secondary | ICD-10-CM | POA: Diagnosis not present

## 2023-03-30 ENCOUNTER — Ambulatory Visit: Payer: Medicaid Other

## 2023-03-30 NOTE — Progress Notes (Signed)
Negative TB test results

## 2023-04-19 ENCOUNTER — Ambulatory Visit: Payer: Medicaid Other | Admitting: Family Medicine

## 2023-05-11 ENCOUNTER — Other Ambulatory Visit: Payer: Self-pay

## 2023-05-11 ENCOUNTER — Emergency Department (HOSPITAL_COMMUNITY)
Admission: EM | Admit: 2023-05-11 | Discharge: 2023-05-11 | Disposition: A | Discharge status: Home / Self Care | Payer: Medicaid Other | Attending: Emergency Medicine | Admitting: Emergency Medicine

## 2023-05-11 ENCOUNTER — Encounter (HOSPITAL_COMMUNITY): Payer: Self-pay

## 2023-05-11 DIAGNOSIS — J020 Streptococcal pharyngitis: Secondary | ICD-10-CM | POA: Insufficient documentation

## 2023-05-11 DIAGNOSIS — J029 Acute pharyngitis, unspecified: Secondary | ICD-10-CM | POA: Diagnosis present

## 2023-05-11 LAB — GROUP A STREP BY PCR: Group A Strep by PCR: DETECTED — AB

## 2023-05-11 MED ORDER — AMOXICILLIN 500 MG PO CAPS
500.0000 mg | ORAL_CAPSULE | Freq: Once | ORAL | Status: AC
  Administered 2023-05-11: 500 mg via ORAL
  Filled 2023-05-11: qty 1

## 2023-05-11 MED ORDER — DEXAMETHASONE 4 MG PO TABS
10.0000 mg | ORAL_TABLET | Freq: Once | ORAL | Status: AC
  Administered 2023-05-11: 10 mg via ORAL
  Filled 2023-05-11: qty 1

## 2023-05-11 MED ORDER — KETOROLAC TROMETHAMINE 30 MG/ML IJ SOLN
30.0000 mg | Freq: Once | INTRAMUSCULAR | Status: AC
  Administered 2023-05-11: 30 mg via INTRAMUSCULAR
  Filled 2023-05-11: qty 1

## 2023-05-11 MED ORDER — AMOXICILLIN 500 MG PO CAPS: 500.0000 mg | ORAL_CAPSULE | Freq: Two times a day (BID) | ORAL | 0 refills | Status: AC

## 2023-05-11 NOTE — ED Triage Notes (Signed)
C/o sore throat, headache, chills, and sob x3 days

## 2023-05-11 NOTE — ED Provider Notes (Signed)
[place] EMERGENCY DEPARTMENT AT [facility] Provider Note   CSN: [id number] Arrival date & time: 05/11/23  1658    History  Chief Complaint  Patient presents with   Sore Throat    Nicole Dillon is a 27 y.o. female here for sore throat.  She has had associated chills.  Symptoms x 3 days.  Associated congestion and rhinorrhea.  No documented fever, chest pain, shortness of breath, neck pain.  She is been able to eat and drink at home however does have a scratchy throat.  No sick contacts.  She has a history of strep pharyngitis, feels similar.  No prior tonsillectomy.  No known sick contacts.  Taking OTC meds at home. No change in voice.  HPI     Home Medications Prior to Admission medications   Medication Sig Start Date End Date Taking? Authorizing Provider  amoxicillin (AMOXIL) 500 MG capsule Take 1 capsule (500 mg total) by mouth 2 (two) times daily for 9 days. 05/11/23 05/20/23 Yes Sharra Cayabyab A, PA-C  amiodarone (PACERONE) 200 MG tablet Take 1 tablet (200 mg total) by mouth 2 (two) times daily. 10/23/20 11/22/20  Charise Killian, MD  carvedilol (COREG) 3.125 MG tablet Take 1 tablet (3.125 mg total) by mouth 2 (two) times daily with a meal. 10/23/20 11/22/20  Charise Killian, MD  digoxin (LANOXIN) 0.125 MG tablet Take 1 tablet (0.125 mg total) by mouth daily. 10/24/20 11/23/20  Charise Killian, MD  guaiFENesin (MUCINEX) 600 MG 12 hr tablet Take 600 mg by mouth 2 (two) times daily.    [provider]      Allergies    Patient has no known allergies.    Review of Systems   Review of Systems  Constitutional:  Positive for chills. Negative for fatigue and fever.  HENT:  Positive for sore throat. Negative for congestion, drooling, ear discharge, ear pain, facial swelling, postnasal drip, rhinorrhea, sinus pressure, sinus pain, sneezing, tinnitus, trouble swallowing and voice change.   Respiratory: Negative.    Cardiovascular: Negative.    Gastrointestinal: Negative.   Genitourinary: Negative.   Musculoskeletal: Negative.   Skin: Negative.   Neurological: Negative.   All other systems reviewed and are negative.   Physical Exam Updated Vital Signs BP 135/73 (BP Location: Right Arm)   Pulse (!) 108   Temp 98 F (36.7 C) (Axillary)   Resp 16   Ht 5\' 5"  (1.651 m)   Wt (!) 154.2 kg   SpO2 100%   BMI 56.58 kg/m  Physical Exam Vitals and nursing note reviewed.  Constitutional:      General: She is not in acute distress.    Appearance: She is well-developed. She is not ill-appearing, toxic-appearing or diaphoretic.  HENT:     Head: Normocephalic and atraumatic.     Jaw: There is normal jaw occlusion.     Comments: No drooling, dysphagia, trismus    Nose:     Comments: Clear rhinorrhea     Mouth/Throat:     Mouth: Mucous membranes are moist.     Palate: No mass and lesions.     Pharynx: Oropharynx is clear. Uvula midline.     Tonsils: Tonsillar exudate present. No tonsillar abscesses. 2+ on the right. 2+ on the left.     Comments: Uvula midline, no pooling of secretions. Tonsils BIL 2 + with erythema and exudate. No obvious PTA, RPA Eyes:     Pupils: Pupils are equal, round, and reactive to light.  Neck:     Trachea: Trachea and phonation normal.     Comments: No neck stiffness, neck rigidity Cardiovascular:     Rate and Rhythm: Normal rate.     Pulses: Normal pulses.     Heart sounds: Normal heart sounds.  Pulmonary:     Effort: Pulmonary effort is normal. No respiratory distress.     Breath sounds: Normal breath sounds and air entry.     Comments: Clear bil, speaks in full sentences without difficulty Abdominal:     General: Bowel sounds are normal. There is no distension.     Palpations: Abdomen is soft.  Musculoskeletal:        General: Normal range of motion.     Cervical back: Full passive range of motion without pain and normal range of motion.  Skin:    General: Skin is warm and dry.   Neurological:     General: No focal deficit present.     Mental Status: She is alert.  Psychiatric:        Mood and Affect: Mood normal.    ED Results / Procedures / Treatments   Labs (all labs ordered are listed, but only abnormal results are displayed) Labs Reviewed  GROUP A STREP BY PCR - Abnormal; Notable for the following components:      Result Value   Group A Strep by PCR DETECTED (*)    All other components within normal limits    EKG None  Radiology No results found.  Procedures Procedures    Medications Ordered in ED Medications  dexamethasone (DECADRON) tablet 10 mg (10 mg Oral Given 05/11/23 1910)  ketorolac (TORADOL) 30 MG/ML injection 30 mg (30 mg Intramuscular Given 05/11/23 1911)  amoxicillin (AMOXIL) capsule 500 mg (500 mg Oral Given 05/11/23 2019)   ED Course/ Medical Decision Making/ A&P   27 year old here for evaluation of sore throat, chills. Afebrile, non septic non ill appearing. Tonsils 2 + BIL with erythema and exudate. No pooling of secretions. No neck stiffness, neck rigidity. Low suspicion for meningitis. Heart and lungs clear. Some clear rhinorrhea. Uvula midline low suspicion for PTA, RPA. Will plan on meds and strep test.  Labs and imaging personally viewed and interpreted:  Strep positive  Discussed results with patient. Tolerating PO intake ok. Will dc home with abx. Given first abx here.  The patient has been appropriately medically screened and/or stabilized in the ED. I have low suspicion for any other emergent medical condition which would require further screening, evaluation or treatment in the ED or require inpatient management.  Patient is hemodynamically stable and in no acute distress.  Patient able to ambulate in department prior to ED.  Evaluation does not show acute pathology that would require ongoing or additional emergent interventions while in the emergency department or further inpatient treatment.  I have discussed the  diagnosis with the patient and answered all questions.  Pain is been managed while in the emergency department and patient has no further complaints prior to discharge.  Patient is comfortable with plan discussed in room and is stable for discharge at this time.  I have discussed strict return precautions for returning to the emergency department.  Patient was encouraged to follow-up with PCP/specialist refer to at discharge.                                 Medical Decision Making Amount and/or Complexity of  Data Reviewed External Data Reviewed: labs, radiology and notes. Labs: ordered. Decision-making details documented in ED Course.  Risk Prescription drug management.         Final Clinical Impression(s) / ED Diagnoses Final diagnoses:  Strep throat    Rx / DC Orders ED Discharge Orders          Ordered    amoxicillin (AMOXIL) 500 MG capsule  2 times daily        05/11/23 2008              Adryan Shin A, PA-C 05/11/23 2326    Lorre Nick, MD 05/15/23 [phone]

## 2023-05-11 NOTE — Discharge Instructions (Signed)
It was a pleasure taking care of you here in the emergency department.  Your strep test here today is positive.  He was started on antibiotics.  I would make sure to follow-up with your primary care provider, return for any worsening symptoms.  May use Tylenol or Motrin as needed for pain.

## 2023-07-25 ENCOUNTER — Telehealth: Payer: Self-pay | Admitting: Family Medicine

## 2023-07-25 ENCOUNTER — Ambulatory Visit: Payer: Medicaid Other

## 2023-07-25 NOTE — Telephone Encounter (Signed)
 Called pt and left vm to call office back to reschedule missed appt for tb testing

## 2023-07-30 ENCOUNTER — Ambulatory Visit (INDEPENDENT_AMBULATORY_CARE_PROVIDER_SITE_OTHER): Payer: Medicaid Other

## 2023-07-30 DIAGNOSIS — Z111 Encounter for screening for respiratory tuberculosis: Secondary | ICD-10-CM

## 2023-08-01 DIAGNOSIS — Z111 Encounter for screening for respiratory tuberculosis: Secondary | ICD-10-CM

## 2023-08-01 LAB — TB SKIN TEST
Induration: 0 mm
TB Skin Test: NEGATIVE

## 2023-08-01 NOTE — Progress Notes (Signed)
Patient is in office today for a nurse visit for PPD. Patient was negative

## 2023-08-02 ENCOUNTER — Emergency Department (HOSPITAL_COMMUNITY)
Admission: EM | Admit: 2023-08-02 | Discharge: 2023-08-02 | Discharge status: Against Medical Advice | Payer: Medicaid Other

## 2023-08-02 NOTE — ED Notes (Signed)
Registration stated that the Pt left with child

## 2023-08-18 ENCOUNTER — Emergency Department (HOSPITAL_COMMUNITY)
Admission: EM | Admit: 2023-08-18 | Discharge: 2023-08-18 | Discharge status: Against Medical Advice | Attending: Emergency Medicine | Admitting: Emergency Medicine

## 2023-08-18 ENCOUNTER — Encounter (HOSPITAL_COMMUNITY): Payer: Self-pay | Admitting: Emergency Medicine

## 2023-08-18 ENCOUNTER — Other Ambulatory Visit: Payer: Self-pay

## 2023-08-18 DIAGNOSIS — R109 Unspecified abdominal pain: Secondary | ICD-10-CM | POA: Insufficient documentation

## 2023-08-18 DIAGNOSIS — R197 Diarrhea, unspecified: Secondary | ICD-10-CM | POA: Diagnosis not present

## 2023-08-18 DIAGNOSIS — Z5321 Procedure and treatment not carried out due to patient leaving prior to being seen by health care provider: Secondary | ICD-10-CM | POA: Insufficient documentation

## 2023-08-18 LAB — COMPREHENSIVE METABOLIC PANEL
ALT: 20 U/L (ref 0–44)
AST: 18 U/L (ref 15–41)
Albumin: 3.8 g/dL (ref 3.5–5.0)
Alkaline Phosphatase: 91 U/L (ref 38–126)
Anion gap: 6 (ref 5–15)
BUN: 11 mg/dL (ref 6–20)
CO2: 24 mmol/L (ref 22–32)
Calcium: 8.3 mg/dL — ABNORMAL LOW (ref 8.9–10.3)
Chloride: 104 mmol/L (ref 98–111)
Creatinine, Ser: 0.91 mg/dL (ref 0.44–1.00)
GFR, Estimated: >60 mL/min (ref >60)
Glucose, Bld: 120 mg/dL — ABNORMAL HIGH (ref 70–99)
Potassium: 3.4 mmol/L — ABNORMAL LOW (ref 3.5–5.1)
Sodium: 134 mmol/L — ABNORMAL LOW (ref 135–145)
Total Bilirubin: 0.8 mg/dL (ref 0.0–1.2)
Total Protein: 7.8 g/dL (ref 6.5–8.1)

## 2023-08-18 LAB — LIPASE, BLOOD: Lipase: 31 U/L (ref 11–51)

## 2023-08-18 LAB — CBC
HCT: 44.8 % (ref 36.0–46.0)
Hemoglobin: 14.4 g/dL (ref 12.0–15.0)
MCH: 29.9 pg (ref 26.0–34.0)
MCHC: 32.1 g/dL (ref 30.0–36.0)
MCV: 92.9 fL (ref 80.0–100.0)
Platelets: 338 10*3/uL (ref 150–400)
RBC: 4.82 MIL/uL (ref 3.87–5.11)
RDW: 13.4 % (ref 11.5–15.5)
WBC: 9.9 10*3/uL (ref 4.0–10.5)
nRBC: 0 % (ref 0.0–0.2)

## 2023-08-18 LAB — URINALYSIS, ROUTINE W REFLEX MICROSCOPIC
Bilirubin Urine: NEGATIVE
Glucose, UA: NEGATIVE mg/dL
Hgb urine dipstick: NEGATIVE
Ketones, ur: NEGATIVE mg/dL
Nitrite: NEGATIVE
Protein, ur: NEGATIVE mg/dL
Specific Gravity, Urine: 1.021 (ref 1.005–1.030)
pH: 5 (ref 5.0–8.0)

## 2023-08-18 LAB — RESP PANEL BY RT-PCR (RSV, FLU A&B, COVID)  RVPGX2
Influenza A by PCR: NEGATIVE
Influenza B by PCR: NEGATIVE
Resp Syncytial Virus by PCR: NEGATIVE
SARS Coronavirus 2 by RT PCR: NEGATIVE

## 2023-08-18 NOTE — ED Triage Notes (Signed)
 Pt presents to the ED via [facility] from home with complaints of abdominal pain and diarrhea x 2 days. States the pain is intermittent and is currently not experiencing any pain at this time. 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 A&Ox4 at this time. Denies CP or SOB.

## 2023-11-22 ENCOUNTER — Ambulatory Visit: Payer: Self-pay

## 2023-11-22 NOTE — Telephone Encounter (Signed)
 FYI Only or Action Required?: FYI only for provider  Patient was last seen in primary care on 03/21/2023 by Abraham Abo, MD. Called Nurse Triage reporting Diarrhea and STD Testing. Symptoms began a week ago. Interventions attempted: Nothing. Symptoms are: stable.  Triage Disposition: No disposition on file.  Patient/caregiver understands and will follow disposition?:  Reason for Disposition  MILD-MODERATE diarrhea (e.g., 1-6 times / day more than normal)  Protocols used: [facility]

## 2023-11-22 NOTE — Telephone Encounter (Signed)
 Call dropped during triage. RN called pt back, received "call cannot be completed as dialed."      Copied from CRM [id number]. Topic: Clinical - Red Word Triage >> Nov 22, 2023 11:13 AM Oddis Bench wrote: Red Word that prompted transfer to Nurse Triage: Patient is calling stating that she is having burning itching and irregular discharge from vagina. Answer Assessment - Initial Assessment Questions 1. DIARRHEA SEVERITY: "How bad is the diarrhea?" "How many more stools have you had in the past 24 hours than normal?"    - NO DIARRHEA (SCALE 0)   - MILD (SCALE 1-3): Few loose or mushy BMs; increase of 1-3 stools over normal daily number of stools; mild increase in ostomy output.   -  MODERATE (SCALE 4-7): Increase of 4-6 stools daily over normal; moderate increase in ostomy output.   -  SEVERE (SCALE 8-10; OR "WORST POSSIBLE"): Increase of 7 or more stools daily over normal; moderate increase in ostomy output; incontinence.     "Every now and then" 2. ONSET: "When did the diarrhea begin?"      Last week, happens 1x wk 3. BM CONSISTENCY: "How loose or watery is the diarrhea?"      Loose, not watery 4. VOMITING: "Are you also vomiting?" If Yes, ask: "How many times in the past 24 hours?"      No  5. ABDOMEN PAIN: "Are you having any abdomen pain?" If Yes, ask: "What does it feel like?" (e.g., crampy, dull, intermittent, constant)      No  6. ABDOMEN PAIN SEVERITY: If present, ask: "How bad is the pain?"  (e.g., Scale 1-10; mild, moderate, or severe)   - MILD (1-3): doesn't interfere with normal activities, abdomen soft and not tender to touch    - MODERATE (4-7): interferes with normal activities or awakens from sleep, abdomen tender to touch    - SEVERE (8-10): excruciating pain, doubled over, unable to do any normal activities       None today but states she experiences some abdominal pain intermittently with Mirena  IUD 7. ORAL INTAKE: If vomiting, "Have you been able to drink liquids?" "How much  liquids have you had in the past 24 hours?"     No vomiting  8. HYDRATION: "Any signs of dehydration?" (e.g., dry mouth [not just dry lips], too weak to stand, dizziness, new weight loss) "When did you last urinate?"     Eating and drinking normally 9. EXPOSURE: "Have you traveled to a foreign country recently?" "Have you been exposed to anyone with diarrhea?" "Could you have eaten any food that was spoiled?"     No  10. ANTIBIOTIC USE "Are you taking antibiotics now or have you taken antibiotics in the past 2 months?"       no 11. OTHER SYMPTOMS: "Do you have any other symptoms?" (e.g., fever, blood in stool)       Wants to be tested for STDs, states she was sexually active last month  Protocols used: [facility]

## 2023-11-22 NOTE — Telephone Encounter (Signed)
 I have attempted to contact this patient by phone with the following results: no answer.

## 2023-12-04 ENCOUNTER — Ambulatory Visit (INDEPENDENT_AMBULATORY_CARE_PROVIDER_SITE_OTHER): Payer: Self-pay | Admitting: Family

## 2023-12-04 ENCOUNTER — Other Ambulatory Visit (HOSPITAL_COMMUNITY)
Admission: RE | Admit: 2023-12-04 | Discharge: 2023-12-04 | Disposition: A | Discharge status: Home / Self Care | Source: Ambulatory Visit | Attending: Family | Admitting: Family

## 2023-12-04 VITALS — BP 118/81 | HR 121 | Temp 98.3°F | Resp 18 | Ht 65.0 in | Wt 365.4 lb

## 2023-12-04 DIAGNOSIS — R197 Diarrhea, unspecified: Secondary | ICD-10-CM

## 2023-12-04 DIAGNOSIS — F419 Anxiety disorder, unspecified: Secondary | ICD-10-CM | POA: Diagnosis not present

## 2023-12-04 DIAGNOSIS — Z975 Presence of (intrauterine) contraceptive device: Secondary | ICD-10-CM | POA: Diagnosis not present

## 2023-12-04 DIAGNOSIS — F32A Depression, unspecified: Secondary | ICD-10-CM

## 2023-12-04 DIAGNOSIS — Z113 Encounter for screening for infections with a predominantly sexual mode of transmission: Secondary | ICD-10-CM

## 2023-12-04 DIAGNOSIS — Z114 Encounter for screening for human immunodeficiency virus [HIV]: Secondary | ICD-10-CM | POA: Diagnosis not present

## 2023-12-04 NOTE — Progress Notes (Signed)
 Diarrhea, wants to be check for diabetes, boyfriend baby mama said they need to come get check.  Patient scored a 12 on PHQ-9

## 2023-12-04 NOTE — Progress Notes (Signed)
 Patient ID: Nicole Dillon, female    DOB: [birth year]  MRN: [id number]  CC: STD Testing  Subjective: Nicole Dillon is a 28 y.o. female who presents for STD testing.   Her concerns today include:  - STD testing. States her boyfriend's baby mother told them they need to get checked. She denies symptoms. - States IUD in place for 8 years and needs referral to have removed. Denies red flag symptoms. - States anxiety depression for years since her father passed away. States she uses marijuana to help. She denies thoughts of self-harm, suicidal ideations, homicidal ideations. - States diarrhea usually once weekly but has improved since began. Denies red flag symptoms.  Patient Active Problem List   Diagnosis Date Noted   Bilateral pneumonia 10/19/2020   Tachyarrhythmia 12/10/2015   History of chlamydia 11/18/2015   Asthma, mild intermittent 05/31/2015   Other allergic rhinitis 05/31/2015   Morbid obesity with BMI of 50.0-59.9, adult (HCC) 06/18/2014   Blood type, Rh negative 04/08/2014     Current Outpatient Medications on File Prior to Visit  Medication Sig Dispense Refill   amiodarone  (PACERONE ) 200 MG tablet Take 1 tablet (200 mg total) by mouth 2 (two) times daily. (Patient not taking: Reported on 12/04/2023) 60 tablet 0   carvedilol  (COREG ) 3.125 MG tablet Take 1 tablet (3.125 mg total) by mouth 2 (two) times daily with a meal. (Patient not taking: Reported on 12/04/2023) 60 tablet 0   digoxin  (LANOXIN ) 0.125 MG tablet Take 1 tablet (0.125 mg total) by mouth daily. (Patient not taking: Reported on 12/04/2023) 30 tablet 0   guaiFENesin  (MUCINEX ) 600 MG 12 hr tablet Take 600 mg by mouth 2 (two) times daily. (Patient not taking: Reported on 12/04/2023)     No current facility-administered medications on file prior to visit.    Allergies  Allergen Reactions   Kiwi Extract Rash    Social History   Socioeconomic History   Marital status: Significant Other    Spouse name: Not  on file   Number of children: Not on file   Years of education: Not on file   Highest education level: 11th grade  Occupational History   Not on file  Tobacco Use   Smoking status: Light Smoker    Current packs/day: 0.00    Types: Cigarettes    Last attempt to quit: 03/10/2014    Years since quitting: 9.7   Smokeless tobacco: Never  Vaping Use   Vaping status: Every Day   Start date: 06/23/2021  Substance and Sexual Activity   Alcohol use: No   Drug use: No   Sexual activity: Yes    Birth control/protection: Condom  Other Topics Concern   Not on file  Social History Narrative   Not on file   Social Drivers of Health   Financial Resource Strain: Medium Risk (12/04/2023)   Overall Financial Resource Strain (CARDIA)    Difficulty of Paying Living Expenses: Somewhat hard  Food Insecurity: No Food Insecurity (12/04/2023)   Hunger Vital Sign    Worried About Running Out of Food in the Last Year: Never true    Ran Out of Food in the Last Year: Never true  Transportation Needs: No Transportation Needs (12/04/2023)   PRAPARE - Administrator, Civil Service (Medical): No    Lack of Transportation (Non-Medical): No  Physical Activity: Inactive (12/04/2023)   Exercise Vital Sign    Days of Exercise per Week: 0 days    Minutes of Exercise  per Session: Not on file  Stress: Stress Concern Present (12/04/2023)   [employer] of Occupational Health - Occupational Stress Questionnaire    Feeling of Stress: To some extent  Social Connections: Unknown (12/04/2023)   Social Connection and Isolation Panel    Frequency of Communication with Friends and Family: Never    Frequency of Social Gatherings with Friends and Family: Never    Attends Religious Services: 1 to 4 times per year    Active Member of [employer] or Organizations: No    Attends Banker Meetings: Not on file    Marital Status: Patient declined  Intimate Partner Violence: Not At Risk (12/04/2023)    Humiliation, Afraid, Rape, and Kick questionnaire    Fear of Current or Ex-Partner: No    Emotionally Abused: No    Physically Abused: No    Sexually Abused: No    Family History  Problem Relation Age of Onset   Diabetes Mother    Diabetes Father    Kidney disease Father        dialysis   Learning disabilities Maternal Uncle        speach impediment   Heart disease Maternal Grandmother    Heart disease Maternal Grandfather     Past Surgical History:  Procedure Laterality Date   TOOTH EXTRACTION      ROS: Review of Systems Negative except as stated above  PHYSICAL EXAM: BP 118/81   Pulse (!) 121   Temp 98.3 F (36.8 C) (Oral)   Resp 18   Ht 5' 5 (1.651 m)   Wt (!) 365 lb 6.4 oz (165.7 kg)   SpO2 98%   BMI 60.81 kg/m   Physical Exam HENT:     Head: Normocephalic and atraumatic.     Nose: Nose normal.     Mouth/Throat:     Mouth: Mucous membranes are moist.     Pharynx: Oropharynx is clear.   Eyes:     Extraocular Movements: Extraocular movements intact.     Conjunctiva/sclera: Conjunctivae normal.     Pupils: Pupils are equal, round, and reactive to light.    Cardiovascular:     Rate and Rhythm: Tachycardia present.     Pulses: Normal pulses.     Heart sounds: Normal heart sounds.  Pulmonary:     Effort: Pulmonary effort is normal.     Breath sounds: Normal breath sounds.   Musculoskeletal:        General: Normal range of motion.     Cervical back: Normal range of motion and neck supple.   Neurological:     General: No focal deficit present.     Mental Status: She is alert and oriented to person, place, and time.   Psychiatric:        Mood and Affect: Mood normal.        Behavior: Behavior normal.     ASSESSMENT AND PLAN: 1. Routine screening for STI (sexually transmitted infection) (Primary) - Routine screening.  - Cervicovaginal ancillary only  2. Encounter for screening for HIV - Routine screening.  - HIV antibody (with  reflex)  3. IUD (intrauterine device) in place - Referral to Gynecology for evaluation/management. - Ambulatory referral to Gynecology  4. Anxiety and depression - Patient denies thoughts of self-harm, suicidal ideations, homicidal ideations. - Patient declined pharmacological therapy.  - Patient declined referral to Psychiatry. - Follow-up with primary provider as scheduled.  5. Diarrhea, unspecified type - Stable. - Follow-up with primary provider as scheduled.  Patient was given the opportunity to ask questions.  Patient verbalized understanding of the plan and was able to repeat key elements of the plan. Patient was given clear instructions to go to Emergency Department or return to medical center if symptoms don't improve, worsen, or new problems develop.The patient verbalized understanding.   Orders Placed This Encounter  Procedures   HIV antibody (with reflex)   Ambulatory referral to Gynecology    Return for Follow-Up or next available with Abraham Abo, MD.  Senaida Dama, NP

## 2023-12-05 ENCOUNTER — Ambulatory Visit: Payer: Self-pay | Admitting: Family

## 2023-12-05 DIAGNOSIS — B9689 Other specified bacterial agents as the cause of diseases classified elsewhere: Secondary | ICD-10-CM

## 2023-12-05 LAB — CERVICOVAGINAL ANCILLARY ONLY
Bacterial Vaginitis (gardnerella): POSITIVE — AB
Candida Glabrata: NEGATIVE
Candida Vaginitis: NEGATIVE
Chlamydia: NEGATIVE
Comment: NEGATIVE
Comment: NEGATIVE
Comment: NEGATIVE
Comment: NEGATIVE
Comment: NEGATIVE
Comment: NORMAL
Neisseria Gonorrhea: NEGATIVE
Trichomonas: NEGATIVE

## 2023-12-05 LAB — HIV ANTIBODY (ROUTINE TESTING W REFLEX): HIV Screen 4th Generation wRfx: NONREACTIVE

## 2023-12-05 MED ORDER — METRONIDAZOLE 500 MG PO TABS: 500.0000 mg | ORAL_TABLET | Freq: Two times a day (BID) | ORAL | 0 refills | Status: AC

## 2024-02-22 ENCOUNTER — Other Ambulatory Visit: Payer: Self-pay | Admitting: Medical Genetics

## 2024-02-27 ENCOUNTER — Encounter: Payer: Self-pay | Admitting: Family Medicine

## 2024-02-27 ENCOUNTER — Ambulatory Visit (INDEPENDENT_AMBULATORY_CARE_PROVIDER_SITE_OTHER): Admitting: Family Medicine

## 2024-02-27 VITALS — BP 133/75 | HR 141 | Ht 65.0 in | Wt 374.8 lb

## 2024-02-27 DIAGNOSIS — Z975 Presence of (intrauterine) contraceptive device: Secondary | ICD-10-CM | POA: Diagnosis not present

## 2024-02-27 DIAGNOSIS — E66813 Obesity, class 3: Secondary | ICD-10-CM | POA: Diagnosis not present

## 2024-02-27 DIAGNOSIS — R000 Tachycardia, unspecified: Secondary | ICD-10-CM

## 2024-02-27 DIAGNOSIS — F32A Depression, unspecified: Secondary | ICD-10-CM

## 2024-02-27 DIAGNOSIS — Z720 Tobacco use: Secondary | ICD-10-CM

## 2024-02-27 DIAGNOSIS — F419 Anxiety disorder, unspecified: Secondary | ICD-10-CM

## 2024-02-27 DIAGNOSIS — Z6841 Body Mass Index (BMI) 40.0 and over, adult: Secondary | ICD-10-CM

## 2024-02-27 DIAGNOSIS — Z7689 Persons encountering health services in other specified circumstances: Secondary | ICD-10-CM

## 2024-02-27 NOTE — Progress Notes (Signed)
 New Patient Office Visit  Subjective    Patient ID: Nicole Dillon, female    DOB: [birth year]  Age: 28 y.o. MRN: [id number]  CC:  Chief Complaint  Patient presents with   Irregular Heart Beat    Pt was taken off her heart medication but is thinking she needs to be put back on. Extremly high heart rate in office today. (141) Also wants referral for obgyn to get her IUD removed.     HPI Nicole Dillon presents to establish care and for review of chronic med issues includin tachycardia. Patient reports that she has been on cardiac meds in the past but has not been on any for about 3 years. She reports increasing sx.    Outpatient Encounter Medications as of 02/27/2024  Medication Sig   aspirin 81 MG chewable tablet Chew by mouth daily.   amiodarone  (PACERONE ) 200 MG tablet Take 1 tablet (200 mg total) by mouth 2 (two) times daily. (Patient not taking: Reported on 12/04/2023)   carvedilol  (COREG ) 3.125 MG tablet Take 1 tablet (3.125 mg total) by mouth 2 (two) times daily with a meal. (Patient not taking: Reported on 12/04/2023)   digoxin  (LANOXIN ) 0.125 MG tablet Take 1 tablet (0.125 mg total) by mouth daily. (Patient not taking: Reported on 12/04/2023)   guaiFENesin  (MUCINEX ) 600 MG 12 hr tablet Take 600 mg by mouth 2 (two) times daily. (Patient not taking: Reported on 12/04/2023)   No facility-administered encounter medications on file as of 02/27/2024.    Past Medical History:  Diagnosis Date   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activity    Asthma    Insomnia    Irregular heart beat    Seasonal allergies     Past Surgical History:  Procedure Laterality Date   TOOTH EXTRACTION      Family History  Problem Relation Age of Onset   Diabetes Mother    Diabetes Father    Kidney disease Father        dialysis   Learning disabilities Maternal Uncle        speach impediment   Heart disease Maternal Grandmother    Heart disease Maternal Grandfather     Social History    Socioeconomic History   Marital status: Significant Other    Spouse name: Not on file   Number of children: Not on file   Years of education: Not on file   Highest education level: 11th grade  Occupational History   Not on file  Tobacco Use   Smoking status: Light Smoker    Current packs/day: 0.00    Types: Cigarettes    Last attempt to quit: 03/10/2014    Years since quitting: 9.9   Smokeless tobacco: Never  Vaping Use   Vaping status: Every Day   Start date: 06/23/2021  Substance and Sexual Activity   Alcohol use: No   Drug use: No   Sexual activity: Yes    Birth control/protection: Condom  Other Topics Concern   Not on file  Social History Narrative   Not on file   Social Drivers of Health   Financial Resource Strain: Medium Risk (12/04/2023)   Overall Financial Resource Strain (CARDIA)    Difficulty of Paying Living Expenses: Somewhat hard  Food Insecurity: No Food Insecurity (12/04/2023)   Hunger Vital Sign    Worried About Running Out of Food in the Last Year: Never true    Ran Out of Food in the Last Year: Never true  Transportation Needs:  No Transportation Needs (12/04/2023)   PRAPARE - Administrator, Civil Service (Medical): No    Lack of Transportation (Non-Medical): No  Physical Activity: Inactive (12/04/2023)   Exercise Vital Sign    Days of Exercise per Week: 0 days    Minutes of Exercise per Session: Not on file  Stress: Stress Concern Present (12/04/2023)   [employer] of Occupational Health - Occupational Stress Questionnaire    Feeling of Stress: To some extent  Social Connections: Unknown (12/04/2023)   Social Connection and Isolation Panel    Frequency of Communication with Friends and Family: Never    Frequency of Social Gatherings with Friends and Family: Never    Attends Religious Services: 1 to 4 times per year    Active Member of [employer] or Organizations: No    Attends Banker Meetings: Not on file    Marital  Status: Patient declined  Intimate Partner Violence: Not At Risk (12/04/2023)   Humiliation, Afraid, Rape, and Kick questionnaire    Fear of Current or Ex-Partner: No    Emotionally Abused: No    Physically Abused: No    Sexually Abused: No    Review of Systems  All other systems reviewed and are negative.       Objective   BP 133/75   Pulse (!) 141   Ht 5' 5 (1.651 m)   Wt (!) 374 lb 12.8 oz (170 kg)   SpO2 95%   BMI 62.37 kg/m   Physical Exam Vitals and nursing note reviewed.  Constitutional:      General: She is not in acute distress.    Appearance: She is obese.  Cardiovascular:     Rate and Rhythm: Normal rate and regular rhythm.  Pulmonary:     Effort: Pulmonary effort is normal.     Breath sounds: Normal breath sounds.  Abdominal:     Palpations: Abdomen is soft.     Tenderness: There is no abdominal tenderness.  Neurological:     General: No focal deficit present.     Mental Status: She is alert and oriented to person, place, and time.  Psychiatric:        Mood and Affect: Mood normal.        Behavior: Behavior normal.         Assessment & Plan:  1. Tachyarrhythmia (Primary) Urgent referral to cardiology 2/2 increasing sx  - Ambulatory referral to Cardiology  2. Anxiety and depression Patient admits sx but will defer agent until seen by cardiology  3. IUD (intrauterine device) in place Referral for removal - Ambulatory referral to Obstetrics / Gynecology  4. Class 3 severe obesity due to excess calories with serious comorbidity and body mass index (BMI) of 60.0 to 69.9 in adult Patient desires weight loss management in the future  5. Vapes nicotine containing substance Discussed reduction/cessation. Patient previously smoked cigarettes.   6. Encounter to establish care     Return in about 4 weeks (around 03/26/2024) for follow up.   Tanda Raguel SQUIBB, MD

## 2024-03-04 ENCOUNTER — Ambulatory Visit

## 2024-03-04 ENCOUNTER — Ambulatory Visit (INDEPENDENT_AMBULATORY_CARE_PROVIDER_SITE_OTHER)

## 2024-03-04 ENCOUNTER — Ambulatory Visit: Attending: Cardiology | Admitting: Cardiology

## 2024-03-04 ENCOUNTER — Other Ambulatory Visit: Payer: Self-pay

## 2024-03-04 ENCOUNTER — Ambulatory Visit: Admitting: Cardiology

## 2024-03-04 VITALS — BP 130/86 | HR 116 | Ht 65.0 in | Wt 378.8 lb

## 2024-03-04 DIAGNOSIS — I420 Dilated cardiomyopathy: Secondary | ICD-10-CM | POA: Insufficient documentation

## 2024-03-04 DIAGNOSIS — R002 Palpitations: Secondary | ICD-10-CM

## 2024-03-04 DIAGNOSIS — I429 Cardiomyopathy, unspecified: Secondary | ICD-10-CM | POA: Diagnosis not present

## 2024-03-04 DIAGNOSIS — I499 Cardiac arrhythmia, unspecified: Secondary | ICD-10-CM | POA: Insufficient documentation

## 2024-03-04 DIAGNOSIS — G4700 Insomnia, unspecified: Secondary | ICD-10-CM | POA: Insufficient documentation

## 2024-03-04 DIAGNOSIS — R000 Tachycardia, unspecified: Secondary | ICD-10-CM | POA: Insufficient documentation

## 2024-03-04 DIAGNOSIS — F988 Other specified behavioral and emotional disorders with onset usually occurring in childhood and adolescence: Secondary | ICD-10-CM | POA: Insufficient documentation

## 2024-03-04 DIAGNOSIS — J45909 Unspecified asthma, uncomplicated: Secondary | ICD-10-CM | POA: Insufficient documentation

## 2024-03-04 DIAGNOSIS — Z6841 Body Mass Index (BMI) 40.0 and over, adult: Secondary | ICD-10-CM | POA: Insufficient documentation

## 2024-03-04 DIAGNOSIS — J302 Other seasonal allergic rhinitis: Secondary | ICD-10-CM | POA: Insufficient documentation

## 2024-03-04 HISTORY — DX: Dilated cardiomyopathy: I42.0

## 2024-03-04 HISTORY — DX: Palpitations: R00.2

## 2024-03-04 NOTE — Patient Instructions (Addendum)
 Medication Instructions:  Your physician recommends that you continue on your current medications as directed. Please refer to the Current Medication list given to you today.  *If you need a refill on your cardiac medications before your next appointment, please call your pharmacy*   Lab Work: BMP, TSH- today If you have labs (blood work) drawn today and your tests are completely normal, you will receive your results only by: MyChart Message (if you have MyChart) OR A paper copy in the mail If you have any lab test that is abnormal or we need to change your treatment, we will call you to review the results.   Testing/Procedures: WHY IS MY DOCTOR PRESCRIBING ZIO? The Zio system is proven and trusted by physicians to detect and diagnose irregular heart rhythms -- and has been prescribed to hundreds of thousands of patients.  The FDA has cleared the Zio system to monitor for many different kinds of irregular heart rhythms. In a study, physicians were able to reach a diagnosis 90% of the time with the Zio system1.  You can wear the Zio monitor -- a small, discreet, comfortable patch -- during your normal day-to-day activity, including while you sleep, shower, and exercise, while it records every single heartbeat for analysis.  1Barrett, P., et al. Comparison of 24 Hour Holter Monitoring Versus 14 Day Novel Adhesive Patch Electrocardiographic Monitoring. American Journal of Medicine, 2014.  ZIO VS. HOLTER MONITORING The Zio monitor can be comfortably worn for up to 14 days. Holter monitors can be worn for 24 to 48 hours, limiting the time to record any irregular heart rhythms you may have. Zio is able to capture data for the 51% of patients who have their first symptom-triggered arrhythmia after 48 hours.1  LIVE WITHOUT RESTRICTIONS The Zio ambulatory cardiac monitor is a small, unobtrusive, and water-resistant patch--you might even forget you're wearing it. The Zio monitor records and stores  every beat of your heart, whether you're sleeping, working out, or showering.   Your physician has requested that you have an echocardiogram. Echocardiography is a painless test that uses sound waves to create images of your heart. It provides your doctor with information about the size and shape of your heart and how well your heart's chambers and valves are working. This procedure takes approximately one hour. There are no restrictions for this procedure. Please do NOT wear cologne, perfume, aftershave, or lotions (deodorant is allowed). Please arrive 15 minutes prior to your appointment time.  Please note: We ask at that you not bring children with you during ultrasound (echo/ vascular) testing. Due to room size and safety concerns, children are not allowed in the ultrasound rooms during exams. Our front office staff cannot provide observation of children in our lobby area while testing is being conducted. An adult accompanying a patient to their appointment will only be allowed in the ultrasound room at the discretion of the ultrasound technician under special circumstances. We apologize for any inconvenience.    Follow-Up: At [facility], you and your health needs are our priority.  As part of our continuing mission to provide you with exceptional heart care, we have created designated Provider Care Teams.  These Care Teams include your primary Cardiologist (physician) and Advanced Practice Providers (APPs -  Physician Assistants and Nurse Practitioners) who all work together to provide you with the care you need, when you need it.  We recommend signing up for the patient portal called MyChart.  Sign up information is provided on this After  Visit Summary.  MyChart is used to connect with patients for Virtual Visits (Telemedicine).  Patients are able to view lab/test results, encounter notes, upcoming appointments, etc.  Non-urgent messages can be sent to your provider as well.   To learn more  about what you can do with MyChart, go to [email or web address].    Your next appointment:   1 month(s)  The format for your next appointment:   In Person  Provider:   [name] Fitch, MD    Other Instructions NA

## 2024-03-04 NOTE — Progress Notes (Signed)
 Cardiology Consultation:    Date:  03/04/2024   ID:  Nicole Dillon, DOB [birth year], MRN [id number]  PCP:  [name], MD  Cardiologist:  [name] Fitch, MD   Referring MD: [name], MD   No chief complaint on file. I have palpitations  History of Present Illness:    Nicole Dillon is a 28 y.o. female who is being seen today for the evaluation of palpitations at the request of [name], MD. past medical history is significant for cardiomyopathy there is an echocardiogram in the chart from 2022 showing her ejection fraction being less than 20 with cardiomegaly.  Also she tells me that a years ago she was in the hospital because of pneumonia and she got arrhythmia she was told to have atrial fibrillation.  However no intervention or year or any investigation done after that.  She has been complaining of having fluttering/butterflies in her chest she said anytime she wakes up in the morning she will feel that also when she walks and when she tried to move around she will feel her heart speeding up sometimes going irregular.  She is in the room with her mother who participated in decision making.  Her mother tells me that she snores somewhat I am worried she may be having obstructive sleep apnea as well.  Denies have any chest pain tightness squeezing pressure burning chest.  No swelling of lower extremities.  She is not on any special diet.  Obesity she has been struggling with all her life.  Her BMI right now is 46  Past Medical History:  Diagnosis Date   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activity    Asthma    Asthma, mild intermittent 05/31/2015   Bilateral pneumonia 10/19/2020   Blood type, Rh negative 04/08/2014   AB Negative  Needs Rhophylac  at 28 wks 10/21/15 and pp if indicated     History of chlamydia 11/18/2015   +11/11/15 Azithro Rx for pt and partner  Pos June 2017  [x] TOC 01/2016 neg     Insomnia    Irregular heart beat    Morbid obesity with BMI of  50.0-59.9, adult (HCC) 06/18/2014   Other allergic rhinitis 05/31/2015   Seasonal allergies    Tachyarrhythmia 12/10/2015   6/23: pt states she's had this all her life. asymptomatic. Never been on medications or had an ECG. ECG and TSH ordered on 6/23. Mobitz 1 on EKG   Cardiology saw pt inpatient 11/2015. Recommended OP F/U w/ Dr. Hester. SABRA       Past Surgical History:  Procedure Laterality Date   TOOTH EXTRACTION      Current Medications: Current Meds  Medication Sig   aspirin 81 MG chewable tablet Chew by mouth daily.     Allergies:   Kiwi extract   Social History   Socioeconomic History   Marital status: Significant Other    Spouse name: Not on file   Number of children: Not on file   Years of education: Not on file   Highest education level: 11th grade  Occupational History   Not on file  Tobacco Use   Smoking status: Light Smoker    Current packs/day: 0.00    Types: Cigarettes    Last attempt to quit: 03/10/2014    Years since quitting: 9.9   Smokeless tobacco: Never  Vaping Use   Vaping status: Every Day   Start date: 06/23/2021  Substance and Sexual Activity   Alcohol use: No   Drug use: No  Sexual activity: Yes    Birth control/protection: Condom  Other Topics Concern   Not on file  Social History Narrative   Not on file   Social Drivers of Health   Financial Resource Strain: Medium Risk (12/04/2023)   Overall Financial Resource Strain (CARDIA)    Difficulty of Paying Living Expenses: Somewhat hard  Food Insecurity: No Food Insecurity (12/04/2023)   Hunger Vital Sign    Worried About Running Out of Food in the Last Year: Never true    Ran Out of Food in the Last Year: Never true  Transportation Needs: No Transportation Needs (12/04/2023)   PRAPARE - Administrator, Civil Service (Medical): No    Lack of Transportation (Non-Medical): No  Physical Activity: Inactive (12/04/2023)   Exercise Vital Sign    Days of Exercise per Week: 0 days     Minutes of Exercise per Session: Not on file  Stress: Stress Concern Present (12/04/2023)   [employer] of Occupational Health - Occupational Stress Questionnaire    Feeling of Stress: To some extent  Social Connections: Unknown (12/04/2023)   Social Connection and Isolation Panel    Frequency of Communication with Friends and Family: Never    Frequency of Social Gatherings with Friends and Family: Never    Attends Religious Services: 1 to 4 times per year    Active Member of [employer] or Organizations: No    Attends Engineer, structural: Not on file    Marital Status: Patient declined     Family History: The patient's family history includes Diabetes in her father and mother; Heart disease in her maternal grandfather and maternal grandmother; Kidney disease in her father; Learning disabilities in her maternal uncle. ROS:   Please see the history of present illness.    All 14 point review of systems negative except as described per history of present illness.  EKGs/Labs/Other Studies Reviewed:    The following studies were reviewed today:   EKG:  EKG Interpretation Date/Time:  Tuesday March 04 2024 15:03:07 EDT Ventricular Rate:  116 PR Interval:  136 QRS Duration:  88 QT Interval:  344 QTC Calculation: 478 R Axis:   5  Text Interpretation: Sinus tachycardia Otherwise normal ECG When compared with ECG of 19-Oct-2020 17:36, PREVIOUS ECG IS PRESENT Confirmed by [name] Charleston [phone]) on 03/04/2024 3:07:40 PM    Recent Labs: 08/18/2023: ALT 20; BUN 11; Creatinine, Ser 0.91; Hemoglobin 14.4; Platelets 338; Potassium 3.4; Sodium 134  Recent Lipid Panel No results found for: CHOL, TRIG, HDL, CHOLHDL, VLDL, LDLCALC, LDLDIRECT  Physical Exam:    VS:  BP 130/86   Pulse (!) 116   Ht 5' 5 (1.651 m)   Wt (!) 378 lb 12.8 oz (171.8 kg)   SpO2 96%   BMI 63.04 kg/m     Wt Readings from Last 3 Encounters:  03/04/24 (!) 378 lb 12.8 oz (171.8 kg)   02/27/24 (!) 374 lb 12.8 oz (170 kg)  12/04/23 (!) 365 lb 6.4 oz (165.7 kg)     GEN:  Well nourished, well developed in no acute distress HEENT: Normal NECK: No JVD; No carotid bruits LYMPHATICS: No lymphadenopathy CARDIAC: RRR, no murmurs, no rubs, no gallops RESPIRATORY:  Clear to auscultation without rales, wheezing or rhonchi  ABDOMEN: Soft, non-tender, non-distended MUSCULOSKELETAL:  No edema; No deformity  SKIN: Warm and dry NEUROLOGIC:  Alert and oriented x 3 PSYCHIATRIC:  Normal affect   ASSESSMENT:    1. Tachyarrhythmia   2. Cardiomyopathy,  unspecified type (HCC)   3. Palpitations   4. 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 Morbid obesity with BMI of 50.0-59.9, adult (HCC)    PLAN:    In order of problems listed above:  Palpitations: Will put Zio patch on her to see if she get any significant arrhythmia.  She is telling me that years ago she was told to have atrial fibrillation but no workup has been done we will try to figure out if she still get episode of atrial fibrillation. History of cardiomyopathy I find in the chart from 2022 report of her echocardiogram with severely reduced left ventricle ejection fraction less than 20 very quickly we were able to do her echocardiogram which I just had the pleasure manage reported ejection fraction is much improved probably in the neighborhood of 45-50, still she deserves to be appropriately treated with probably Entresto  and beta-blockers.  I will ask her to have Chem-7 done and if Chem-7 is fine we will initiate that therapy. Morbid obesity obviously contributing factor she understand she is trying to lose weight. Cholesterol status unknown we will try to do the test   Medication Adjustments/Labs and Tests Ordered: Current medicines are reviewed at length with the patient today.  Concerns regarding medicines are outlined above.  Orders Placed This Encounter  Procedures   Basic metabolic panel with GFR   TSH   LONG  TERM MONITOR (3-14 DAYS)   EKG 12-Lead   ECHOCARDIOGRAM COMPLETE   No orders of the defined types were placed in this encounter.   Signed, [name] DOROTHA Fitch, MD, [facility]. 03/04/2024 4:41 PM    [facility]

## 2024-03-05 LAB — TSH: TSH: 1.15 u[IU]/mL (ref 0.450–4.500)

## 2024-03-05 LAB — BASIC METABOLIC PANEL WITH GFR
BUN/Creatinine Ratio: 12 (ref 9–23)
BUN: 9 mg/dL (ref 6–20)
CO2: 23 mmol/L (ref 20–29)
Calcium: 9.2 mg/dL (ref 8.7–10.2)
Chloride: 103 mmol/L (ref 96–106)
Creatinine, Ser: 0.76 mg/dL (ref 0.57–1.00)
Glucose: 76 mg/dL (ref 70–99)
Potassium: 4.3 mmol/L (ref 3.5–5.2)
Sodium: 140 mmol/L (ref 134–144)
eGFR: 109 mL/min/1.73 (ref >59)

## 2024-03-05 LAB — ECHOCARDIOGRAM COMPLETE
Area-P 1/2: 4.31 cm2
Height: 65 in
S' Lateral: 5.1 cm
Weight: 6060.8 [oz_av]

## 2024-03-06 ENCOUNTER — Telehealth: Payer: Self-pay

## 2024-03-06 ENCOUNTER — Ambulatory Visit: Payer: Self-pay | Admitting: Cardiology

## 2024-03-06 NOTE — Telephone Encounter (Signed)
 Left message on My Chart with lab results per Dr. Karry note. Routed to PCP

## 2024-03-10 ENCOUNTER — Telehealth: Payer: Self-pay | Admitting: *Deleted

## 2024-03-10 NOTE — Telephone Encounter (Signed)
 This email is to notify you that the patient had a fall off [3 DAYS] into the wear time and we were unable to resolve the issue with troubleshooting. We have placed an order for a replacement device to be shipped to the patient and placed a hold on the billing, so the patient is not charged for the first device.

## 2024-03-31 ENCOUNTER — Telehealth: Payer: Self-pay | Admitting: Physician Assistant

## 2024-03-31 NOTE — Telephone Encounter (Signed)
 Call received from [employer]:  Pt had autotriggered event of SVT with HR 184 bpm for 60 seconds on 03/20/24 at 5:39 pm EST.   Will forward to Dr. Krasowski.

## 2024-04-04 ENCOUNTER — Encounter: Payer: Self-pay | Admitting: Cardiology

## 2024-04-04 ENCOUNTER — Ambulatory Visit: Attending: Cardiology | Admitting: Cardiology

## 2024-04-04 VITALS — BP 124/72 | HR 126 | Ht 65.0 in | Wt 379.4 lb

## 2024-04-04 DIAGNOSIS — I471 Supraventricular tachycardia, unspecified: Secondary | ICD-10-CM | POA: Insufficient documentation

## 2024-04-04 DIAGNOSIS — Z6841 Body Mass Index (BMI) 40.0 and over, adult: Secondary | ICD-10-CM | POA: Insufficient documentation

## 2024-04-04 DIAGNOSIS — I420 Dilated cardiomyopathy: Secondary | ICD-10-CM | POA: Diagnosis not present

## 2024-04-04 DIAGNOSIS — R002 Palpitations: Secondary | ICD-10-CM | POA: Diagnosis present

## 2024-04-04 DIAGNOSIS — I441 Atrioventricular block, second degree: Secondary | ICD-10-CM | POA: Insufficient documentation

## 2024-04-04 DIAGNOSIS — G4733 Obstructive sleep apnea (adult) (pediatric): Secondary | ICD-10-CM | POA: Insufficient documentation

## 2024-04-04 HISTORY — DX: Obstructive sleep apnea (adult) (pediatric): G47.33

## 2024-04-04 HISTORY — DX: Atrioventricular block, second degree: I44.1

## 2024-04-04 MED ORDER — SACUBITRIL-VALSARTAN 24-26 MG PO TABS: 1.0000 | ORAL_TABLET | Freq: Two times a day (BID) | ORAL | Status: DC

## 2024-04-04 MED ORDER — CARVEDILOL 3.125 MG PO TABS: 3.1250 mg | ORAL_TABLET | Freq: Every day | ORAL | 3 refills | Status: DC

## 2024-04-04 NOTE — Progress Notes (Unsigned)
 Cardiology Office Note:    Date:  04/04/2024   ID:  Nicole Dillon, DOB 18-Oct-1995, MRN [id number]  PCP:  [name], MD  Cardiologist:  [name] Fitch, MD    Referring MD: [name], MD   No chief complaint on file. Doing fine  History of Present Illness:    Nicole Dillon is a 28 y.o. female past medical history significant for cardiomyopathy in 2022 she did have cardiomyopathy with ejection fraction 20% after that she disappeared from follow-up also atrial fibrillation showed up in my office in the beginning of September.  Overall feeling fine echocardiogram showed ejection fraction 45%, monitor showed multiple episode of supraventricular tachycardia with some episodes of second-degree type I AV block and early morning hours she comes today to talk about this.  She comes with fianc who participated in decision making.  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 asymptomatic. Never been on medications or had an ECG. ECG and TSH ordered on 6/23. Mobitz 1 on EKG   Cardiology saw pt inpatient 11/2015. Recommended OP F/U w/ Dr. Hester. SABRA       Past Surgical History:  Procedure Laterality Date    TOOTH EXTRACTION      Current Medications: Current Meds  Medication Sig   aspirin 81 MG chewable tablet Chew by mouth daily.     Allergies:   Kiwi extract   Social History   Socioeconomic History   Marital status: Significant Other    Spouse name: Not on file   Number of children: Not on file   Years of education: Not on file   Highest education level: 11th grade  Occupational History   Not on file  Tobacco Use   Smoking status: Light Smoker    Current packs/day: 0.00    Types: Cigarettes    Last attempt to quit: 03/10/2014    Years since quitting: 10.0   Smokeless tobacco: Never  Vaping Use   Vaping status: Every Day   Start date: 06/23/2021  Substance and Sexual Activity   Alcohol use: No   Drug use: No   Sexual activity: Yes    Birth control/protection: Condom  Other Topics Concern   Not on file  Social History Narrative   Not on file   Social Drivers of Health   Financial Resource Strain: Medium Risk (12/04/2023)   Overall Financial Resource Strain (CARDIA)    Difficulty of Paying Living Expenses: Somewhat hard  Food Insecurity: No Food Insecurity (12/04/2023)   Hunger Vital Sign    Worried About Running Out of Food in the Last Year: Never true    Ran Out of Food  in the Last Year: Never true  Transportation Needs: No Transportation Needs (12/04/2023)   PRAPARE - Administrator, Civil Service (Medical): No    Lack of Transportation (Non-Medical): No  Physical Activity: Inactive (12/04/2023)   Exercise Vital Sign    Days of Exercise per Week: 0 days    Minutes of Exercise per Session: Not on file  Stress: Stress Concern Present (12/04/2023)   [employer] of Occupational Health - Occupational Stress Questionnaire    Feeling of Stress: To some extent  Social Connections: Unknown (12/04/2023)   Social Connection and Isolation Panel    Frequency of Communication with Friends and Family: Never    Frequency of Social Gatherings with Friends and  Family: Never    Attends Religious Services: 1 to 4 times per year    Active Member of [employer] or Organizations: No    Attends Engineer, structural: Not on file    Marital Status: Patient declined     Family History: The patient's family history includes Diabetes in her father and mother; Heart disease in her maternal grandfather and maternal grandmother; Kidney disease in her father; Learning disabilities in her maternal uncle. ROS:   Please see the history of present illness.    All 14 point review of systems negative except as described per history of present illness  EKGs/Labs/Other Studies Reviewed:         Recent Labs: 08/18/2023: ALT 20; Hemoglobin 14.4; Platelets 338 03/04/2024: BUN 9; Creatinine, Ser 0.76; Potassium 4.3; Sodium 140; TSH 1.150  Recent Lipid Panel No results found for: CHOL, TRIG, HDL, CHOLHDL, VLDL, LDLCALC, LDLDIRECT  Physical Exam:    VS:  BP 124/72   Pulse (!) 126   Ht 5' 5 (1.651 m)   Wt (!) 379 lb 6.4 oz (172.1 kg)   SpO2 99%   BMI 63.14 kg/m     Wt Readings from Last 3 Encounters:  04/04/24 (!) 379 lb 6.4 oz (172.1 kg)  03/04/24 (!) 378 lb 12.8 oz (171.8 kg)  02/27/24 (!) 374 lb 12.8 oz (170 kg)     GEN:  Well nourished, well developed in no acute distress HEENT: Normal NECK: No JVD; No carotid bruits LYMPHATICS: No lymphadenopathy CARDIAC: RRR, no murmurs, no rubs, no gallops RESPIRATORY:  Clear to auscultation without rales, wheezing or rhonchi  ABDOMEN: Soft, non-tender, non-distended MUSCULOSKELETAL:  No edema; No deformity  SKIN: Warm and dry LOWER EXTREMITIES: no swelling NEUROLOGIC:  Alert and oriented x 3 PSYCHIATRIC:  Normal affect   ASSESSMENT:    1. 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 Morbid obesity with BMI of 50.0-59.9, adult (HCC)   3. Palpitations   4. Supraventricular tachycardia   5. Second degree AV block, Mobitz type I   6. Obstructive sleep apnea    PLAN:    In order of  problems listed above:  Dilated cardiomyopathy.  Will initiate Entresto  24-26 twice daily and to warn her about the fact if she is planning to get pregnant or if she gets pregnant she must stop the medication immediately.  I will also initiate carvedilol  however only 2.125 once a day in the morning. Palpitations/supraventricular tachycardia.  Will start carvedilol  3.125 once daily only in the morning. Snoring with most likely sleep apnea.  Will schedule him to have a sleep study I hope when we manage sleep apnea appropriate second-degree type I AV block will be taking care of. Morbid obesity obesity contributing problem   Medication Adjustments/Labs  and Tests Ordered: Current medicines are reviewed at length with the patient today.  Concerns regarding medicines are outlined above.  No orders of the defined types were placed in this encounter.  Medication changes: No orders of the defined types were placed in this encounter.   Signed, [name] DOROTHA Fitch, MD, [facility] 04/04/2024 3:39 PM    [facility]

## 2024-04-04 NOTE — Patient Instructions (Signed)
 Medication Instructions:   START: Carvedilol  3.125 1 daily  START: Entresto  24/26 1 tablet twice daily   Lab Work: Your physician recommends that you return for lab work in: 1 week You need to have labs done when you are fasting.  You can come Monday through Friday 8:30 am to 12:00 pm and 1:15 to 4:30. You do not need to make an appointment as the order has already been placed. The labs you are going to have done are BMET    Testing/Procedures: Itamar sleep study- Company will call and send device   Follow-Up: At [facility], you and your health needs are our priority.  As part of our continuing mission to provide you with exceptional heart care, we have created designated Provider Care Teams.  These Care Teams include your primary Cardiologist (physician) and Advanced Practice Providers (APPs -  Physician Assistants and Nurse Practitioners) who all work together to provide you with the care you need, when you need it.  We recommend signing up for the patient portal called MyChart.  Sign up information is provided on this After Visit Summary.  MyChart is used to connect with patients for Virtual Visits (Telemedicine).  Patients are able to view lab/test results, encounter notes, upcoming appointments, etc.  Non-urgent messages can be sent to your provider as well.   To learn more about what you can do with MyChart, go to [email or web address].    Your next appointment:   1 month(s)  The format for your next appointment:   In Person  Provider:   [name] Fitch, MD    Other Instructions NA

## 2024-04-09 ENCOUNTER — Ambulatory Visit: Admitting: Family Medicine

## 2024-04-17 ENCOUNTER — Telehealth: Payer: Self-pay | Admitting: Family Medicine

## 2024-04-17 ENCOUNTER — Ambulatory Visit: Admitting: Family Medicine

## 2024-04-17 NOTE — Telephone Encounter (Signed)
 Called pt to reschedule missed appt; could not reach or leave vm

## 2024-04-21 ENCOUNTER — Other Ambulatory Visit: Payer: Self-pay | Admitting: Family Medicine

## 2024-04-21 ENCOUNTER — Ambulatory Visit: Payer: Self-pay

## 2024-04-21 NOTE — Telephone Encounter (Signed)
 FYI Only or Action Required?: Action required by provider: medication refill request.  Patient was last seen in primary care on 02/27/2024 by [name], MD.  Called Nurse Triage reporting Chest Pain.  Symptoms began today.  Interventions attempted: Nothing.  Symptoms are: stable.  Triage Disposition: Home Care  Patient/caregiver understands and will follow disposition?: Yes          Copied from CRM [id number]. Topic: Clinical - Red Word Triage >> Apr 21, 2024  1:29 PM Wess RAMAN wrote: Red Word that prompted transfer to Nurse Triage: Chest pain. Lost heart medication (carvedilol  (COREG ) 3.125 MG tablet). Patient has AFIB Reason for Disposition  Chest pain(s) lasting a few seconds  Answer Assessment - Initial Assessment Questions Last dose of carvedilol  (COREG ) 3.125 MG tablet was last week. She states she has misplaced the medication and is requesting a refill. Preferred pharmacy below. Caller disconnected call, will route to office for follow up.    [facility] [address], [place] - [address]  [address], [place] [address]      1. LOCATION: Where does it hurt?       L side of chest  2. RADIATION: Does the pain go anywhere else? (e.g., into neck, jaw, arms, back)     Denies  3. ONSET: When did the chest pain begin? (Minutes, hours or days)      Today  4. PATTERN: Does the pain come and go, or has it been constant since it started?  Does it get worse with exertion?      Comes and goes  5. DURATION: How long does it last (e.g., seconds, minutes, hours)     5-10 seconds  6. SEVERITY: How bad is the pain?  (e.g., Scale 1-10; mild, moderate, or severe)      A little pain between a 2 and 3  7. CARDIAC RISK FACTORS: Do you have any history of heart problems or risk factors for heart disease? (e.g., angina, prior heart attack; diabetes, high blood pressure, high cholesterol, smoker, or strong family history of heart disease)      A fib  8. CAUSE: What do you think is causing the chest pain?     Has not taken her coreg  medication  9. OTHER SYMPTOMS: Do you have any other symptoms? (e.g., dizziness, nausea, vomiting, sweating, fever, difficulty breathing, cough)       Denies  Protocols used: Chest Pain-A-AH

## 2024-04-21 NOTE — Telephone Encounter (Signed)
 Answer Assessment - Initial Assessment Questions 1. REASON FOR CALL: What is the main reason for your call? or How can I best help you?     Looking for OK to refill Carvedilol  early as medication was lost. Had once episode of CP 5-10 seconds long about an hourish ago. Patient denies SOB, dizziness. Has hx of afib. States she just needs to take her Coreg .  Advised to reach out to Cardiology office to get ok to refill as they prescribed it. Advised CP needs to be evaluated in the ED. Pt aware and understands. Needs to schedule FU that she missed due to work. Appt made for 12/24 and placed on the waitlist.  Protocols used: Information Only Call - No Triage-A-AH

## 2024-04-22 NOTE — Telephone Encounter (Signed)
 Requested Prescriptions  Refused Prescriptions Disp Refills   carvedilol  (COREG ) 3.125 MG tablet 90 tablet 3    Sig: Take 1 tablet (3.125 mg total) by mouth daily.     Cardiovascular: Beta Blockers 3 Failed - 04/22/2024  5:16 PM      Failed - Last Heart Rate in normal range    Pulse Readings from Last 1 Encounters:  04/04/24 (!) 126         Passed - Cr in normal range and within 360 days    Creatinine, Ser  Date Value Ref Range Status  03/04/2024 0.76 0.57 - 1.00 mg/dL Final         Passed - AST in normal range and within 360 days    AST  Date Value Ref Range Status  08/18/2023 18 15 - 41 U/L Final         Passed - ALT in normal range and within 360 days    ALT  Date Value Ref Range Status  08/18/2023 20 0 - 44 U/L Final         Passed - Last BP in normal range    BP Readings from Last 1 Encounters:  04/04/24 124/72         Passed - Valid encounter within last 6 months    Recent Outpatient Visits           1 month ago Tachyarrhythmia   [facility], [place], MD   4 months ago Routine screening for STI (sexually transmitted infection)   [facility], [place], NP   1 year ago Encounter for other general counseling or advice on contraception   [facility], MD       Future Appointments             In 2 weeks [name], [name] PARAS, MD [facility] at [place]   In 3 weeks [name], [name], RPH-CPP [facility] at [place]

## 2024-04-23 DIAGNOSIS — R000 Tachycardia, unspecified: Secondary | ICD-10-CM | POA: Diagnosis not present

## 2024-05-09 ENCOUNTER — Encounter: Payer: Self-pay | Admitting: Cardiology

## 2024-05-09 ENCOUNTER — Ambulatory Visit: Attending: Cardiology | Admitting: Cardiology

## 2024-05-09 VITALS — BP 100/70 | HR 112 | Ht 65.0 in | Wt 378.8 lb

## 2024-05-09 DIAGNOSIS — Z6841 Body Mass Index (BMI) 40.0 and over, adult: Secondary | ICD-10-CM | POA: Insufficient documentation

## 2024-05-09 DIAGNOSIS — I441 Atrioventricular block, second degree: Secondary | ICD-10-CM | POA: Diagnosis present

## 2024-05-09 DIAGNOSIS — R0683 Snoring: Secondary | ICD-10-CM | POA: Insufficient documentation

## 2024-05-09 DIAGNOSIS — I420 Dilated cardiomyopathy: Secondary | ICD-10-CM | POA: Diagnosis present

## 2024-05-09 DIAGNOSIS — I471 Supraventricular tachycardia, unspecified: Secondary | ICD-10-CM | POA: Insufficient documentation

## 2024-05-09 MED ORDER — SACUBITRIL-VALSARTAN 24-26 MG PO TABS: 1.0000 | ORAL_TABLET | Freq: Two times a day (BID) | ORAL | 3 refills | Status: AC

## 2024-05-09 MED ORDER — CARVEDILOL 3.125 MG PO TABS: 3.1250 mg | ORAL_TABLET | Freq: Every day | ORAL | 3 refills | Status: AC

## 2024-05-09 NOTE — Patient Instructions (Signed)
 Medication Instructions:  Your physician recommends that you continue on your current medications as directed. Please refer to the Current Medication list given to you today.  *If you need a refill on your cardiac medications before your next appointment, please call your pharmacy*   Lab Work: None Ordered If you have labs (blood work) drawn today and your tests are completely normal, you will receive your results only by: MyChart Message (if you have MyChart) OR A paper copy in the mail If you have any lab test that is abnormal or we need to change your treatment, we will call you to review the results.   Testing/Procedures: Itamar Sleep Study- Company will call and send device   Follow-Up: At [facility], you and your health needs are our priority.  As part of our continuing mission to provide you with exceptional heart care, we have created designated Provider Care Teams.  These Care Teams include your primary Cardiologist (physician) and Advanced Practice Providers (APPs -  Physician Assistants and Nurse Practitioners) who all work together to provide you with the care you need, when you need it.  We recommend signing up for the patient portal called MyChart.  Sign up information is provided on this After Visit Summary.  MyChart is used to connect with patients for Virtual Visits (Telemedicine).  Patients are able to view lab/test results, encounter notes, upcoming appointments, etc.  Non-urgent messages can be sent to your provider as well.   To learn more about what you can do with MyChart, go to [email or web address].    Your next appointment:   3 month(s)  The format for your next appointment:   In Person  Provider:   [name] Fitch, MD    Other Instructions NA

## 2024-05-09 NOTE — Progress Notes (Signed)
 Cardiology Office Note:    Date:  05/09/2024   ID:  Nicole Dillon, DOB 1996/01/02, MRN [id number]  PCP:  [name], MD  Cardiologist:  [name] Fitch, MD    Referring MD: [name], MD   No chief complaint on file. Feeling better  History of Present Illness:    Nicole Dillon is a 28 y.o. female past medical history significant for cardiomyopathy initially detected in 2022 with ejection fraction 20% then last time checked few months ago show improvement in ejection fraction to 45%, she did wear a monitor which showed supraventricular tachycardia as well as second-degree type I AV block and early morning hours she was put on Entresto  as well as carvedilol  only small dosages because of blood pressure being low.  Comes today for follow-up.  She says she is feeling better.  Denies have any chest pain tightness squeezing pressure burning chest, she works in 3 different restaurants and she has no difficulty doing no passing out no dizziness no palpitations  Past Medical History:  Diagnosis Date   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activity    Asthma    Asthma, mild intermittent 05/31/2015   Bilateral pneumonia 10/19/2020   Blood type, Rh negative 04/08/2014   AB Negative  Needs Rhophylac  at 28 wks 10/21/15 and pp if indicated     Dilated cardiomyopathy (HCC) 45 to 50% 03/04/2024   History of chlamydia 11/18/2015   +11/11/15 Azithro Rx for pt and partner  Pos June 2017  [x] TOC 01/2016 neg     Insomnia    Irregular heart beat    Morbid obesity with BMI of 50.0-59.9, adult (HCC) 06/18/2014   Obstructive sleep apnea 04/04/2024   Other allergic rhinitis 05/31/2015   Palpitations 03/04/2024   Seasonal allergies    Second degree AV block, Mobitz type I 04/04/2024   Supraventricular tachycardia 12/10/2015   6/23: pt states she's had this all her life. asymptomatic. Never been on medications or had an ECG. ECG and TSH ordered on 6/23. Mobitz 1 on EKG   Cardiology saw pt  inpatient 11/2015. Recommended OP F/U w/ Dr. Hester. .      Tachyarrhythmia 12/10/2015   6/23: pt states she's had this all her life. asymptomatic. Never been on medications or had an ECG. ECG and TSH ordered on 6/23. Mobitz 1 on EKG   Cardiology saw pt inpatient 11/2015. Recommended OP F/U w/ Dr. Hester. SABRA       Past Surgical History:  Procedure Laterality Date   TOOTH EXTRACTION      Current Medications: Current Meds  Medication Sig   aspirin 81 MG chewable tablet Chew by mouth daily.   carvedilol  (COREG ) 3.125 MG tablet Take 1 tablet (3.125 mg total) by mouth daily.   sacubitril -valsartan  (ENTRESTO ) 24-26 MG Take 1 tablet by mouth 2 (two) times daily.     Allergies:   Kiwi extract   Social History   Socioeconomic History   Marital status: Significant Other    Spouse name: Not on file   Number of children: Not on file   Years of education: Not on file   Highest education level: 11th grade  Occupational History   Not on file  Tobacco Use   Smoking status: Light Smoker    Current packs/day: 0.00    Types: Cigarettes    Last attempt to quit: 03/10/2014    Years since quitting: 10.1   Smokeless tobacco: Never  Vaping Use   Vaping status: Every Day   Start date:  06/23/2021  Substance and Sexual Activity   Alcohol use: No   Drug use: No   Sexual activity: Yes    Birth control/protection: Condom  Other Topics Concern   Not on file  Social History Narrative   Not on file   Social Drivers of Health   Financial Resource Strain: Medium Risk (12/04/2023)   Overall Financial Resource Strain (CARDIA)    Difficulty of Paying Living Expenses: Somewhat hard  Food Insecurity: No Food Insecurity (12/04/2023)   Hunger Vital Sign    Worried About Running Out of Food in the Last Year: Never true    Ran Out of Food in the Last Year: Never true  Transportation Needs: No Transportation Needs (12/04/2023)   PRAPARE - Administrator, Civil Service (Medical): No    Lack  of Transportation (Non-Medical): No  Physical Activity: Inactive (12/04/2023)   Exercise Vital Sign    Days of Exercise per Week: 0 days    Minutes of Exercise per Session: Not on file  Stress: Stress Concern Present (12/04/2023)   [employer] of Occupational Health - Occupational Stress Questionnaire    Feeling of Stress: To some extent  Social Connections: Unknown (12/04/2023)   Social Connection and Isolation Panel    Frequency of Communication with Friends and Family: Never    Frequency of Social Gatherings with Friends and Family: Never    Attends Religious Services: 1 to 4 times per year    Active Member of [employer] or Organizations: No    Attends Engineer, Structural: Not on file    Marital Status: Patient declined     Family History: The patient's family history includes Diabetes in her father and mother; Heart disease in her maternal grandfather and maternal grandmother; Kidney disease in her father; Learning disabilities in her maternal uncle. ROS:   Please see the history of present illness.    All 14 point review of systems negative except as described per history of present illness  EKGs/Labs/Other Studies Reviewed:         Recent Labs: 08/18/2023: ALT 20; Hemoglobin 14.4; Platelets 338 03/04/2024: BUN 9; Creatinine, Ser 0.76; Potassium 4.3; Sodium 140; TSH 1.150  Recent Lipid Panel No results found for: CHOL, TRIG, HDL, CHOLHDL, VLDL, LDLCALC, LDLDIRECT  Physical Exam:    VS:  BP 100/70   Pulse (!) 112   Ht 5' 5 (1.651 m)   Wt (!) 378 lb 12.8 oz (171.8 kg)   SpO2 98%   BMI 63.04 kg/m     Wt Readings from Last 3 Encounters:  05/09/24 (!) 378 lb 12.8 oz (171.8 kg)  04/04/24 (!) 379 lb 6.4 oz (172.1 kg)  03/04/24 (!) 378 lb 12.8 oz (171.8 kg)     GEN:  Well nourished, well developed in no acute distress HEENT: Normal NECK: No JVD; No carotid bruits LYMPHATICS: No lymphadenopathy CARDIAC: RRR, no murmurs, no rubs, no  gallops RESPIRATORY:  Clear to auscultation without rales, wheezing or rhonchi  ABDOMEN: Soft, non-tender, non-distended MUSCULOSKELETAL:  No edema; No deformity  SKIN: Warm and dry LOWER EXTREMITIES: no swelling NEUROLOGIC:  Alert and oriented x 3 PSYCHIATRIC:  Normal affect   ASSESSMENT:    1. 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 Second degree AV block, Mobitz type I   3. Supraventricular tachycardia   4. Morbid obesity with BMI of 50.0-59.9, adult (HCC)    PLAN:    In order of problems listed above:  Cardiomyopathy, on maximally tolerated  medication because of low blood pressure.  But overall clinically she says she is doing better etiology of this phenomenon and still unclear possibility of arrhythmia as there put her on small dose of beta-blocker because of second-degree type I AV block in early morning hours, she is supposed to have sleep study done but it did not happen.  Will make sure that she will have the test done. Second-degree type I AV block I suspect sleep apnea play significant role here plan is to do sleep study. Morbid obesity still a problem she had BMI from 50 now at 63.  She understands the problem trying to work on diet however the fact that she works in [employer] does not have   Medication Adjustments/Labs and Tests Ordered: Current medicines are reviewed at length with the patient today.  Concerns regarding medicines are outlined above.  No orders of the defined types were placed in this encounter.  Medication changes: No orders of the defined types were placed in this encounter.   Signed, [name] DOROTHA Fitch, MD, [facility] 05/09/2024 2:05 PM    [facility]

## 2024-05-13 ENCOUNTER — Ambulatory Visit
Attending: Pharmacist Clinician (PhC)/ Clinical Pharmacy Specialist | Admitting: Pharmacist Clinician (PhC)/ Clinical Pharmacy Specialist

## 2024-05-28 ENCOUNTER — Encounter: Payer: Self-pay | Admitting: Certified Nurse Midwife

## 2024-05-28 ENCOUNTER — Other Ambulatory Visit (HOSPITAL_COMMUNITY)
Admission: RE | Admit: 2024-05-28 | Discharge: 2024-05-28 | Disposition: A | Discharge status: Home / Self Care | Source: Ambulatory Visit | Attending: Certified Nurse Midwife | Admitting: Certified Nurse Midwife

## 2024-05-28 ENCOUNTER — Ambulatory Visit: Admitting: Certified Nurse Midwife

## 2024-05-28 VITALS — BP 114/80 | HR 70 | Wt 386.0 lb

## 2024-05-28 DIAGNOSIS — Z01419 Encounter for gynecological examination (general) (routine) without abnormal findings: Secondary | ICD-10-CM | POA: Diagnosis not present

## 2024-05-28 DIAGNOSIS — Z124 Encounter for screening for malignant neoplasm of cervix: Secondary | ICD-10-CM

## 2024-05-28 DIAGNOSIS — I441 Atrioventricular block, second degree: Secondary | ICD-10-CM | POA: Diagnosis not present

## 2024-05-28 DIAGNOSIS — I420 Dilated cardiomyopathy: Secondary | ICD-10-CM | POA: Diagnosis not present

## 2024-05-28 DIAGNOSIS — Z30432 Encounter for removal of intrauterine contraceptive device: Secondary | ICD-10-CM

## 2024-05-28 NOTE — Progress Notes (Signed)
 Subjective:     Nicole Dillon is a 28 y.o. female here at [facility] for a routine exam.  Current complaints: wants IUD removed. States that her IUD strings has not been able to be visualized for 2 years by 3 different providers. Complains of having pelvic pain. Denies pain with intercourse.  Personal and family health history reviewed: yes.  Do you have a primary care provider? yes Do you feel safe at home? Yes  Flowsheet Row Office Visit from 05/28/2024 in [facility] for [facility] at [facility] Total Score 3    Health Maintenance Due  Topic Date Due   Hepatitis C Screening  Never done   Hepatitis B Vaccines 19-59 Average Risk (1 of 3 - 19+ 3-dose series) Never done   Cervical Cancer Screening (Pap smear)  01/27/2019   Influenza Vaccine  01/18/2024   COVID-19 Vaccine (1 - 2025-26 season) Never done     Risk factors for chronic health problems: HTN, Obesity Smoking: Vape daily Alchohol/how much: Occ like 2-3 times per year Pt BMI: Body mass index is 64.23 kg/m.   Gynecologic History No LMP recorded. (Menstrual status: IUD). Contraception: IUD Strings has not been visualized in 2 years Last Pap: 01/27/2016. Results were: normal Last mammogram: N/A. Results were: Not indicated  Obstetric History OB History  Gravida Para Term Preterm AB Living  3 2 2  1 2   SAB IAB Ectopic Multiple Live Births  1   0 2    # Outcome Date GA Lbr Len/2nd Weight Sex Type Anes PTL Lv  3 Term 12/11/15 [redacted]w[redacted]d 06:29 / 00:02 3320 g F Vag-Spont None  LIV  2 Term 11/08/14 [redacted]w[redacted]d / 00:18 3560 g F Vag-Spont None  LIV  1 SAB 2013             The following portions of the patient's history were reviewed and updated as appropriate: allergies, current medications, past family history, past medical history, past social history, past surgical history, and problem list.  Review of Systems Pertinent items are noted in HPI. Pertinent items noted in HPI and remainder of  comprehensive ROS otherwise negative.    Objective:   Today's Vitals   05/28/24 1427 05/28/24 1442  BP:  114/80  Pulse:  70  Weight: (!) 175.1 kg    Body mass index is 64.23 kg/m.  VS reviewed, nursing note reviewed,  Constitutional: well developed, well nourished, no distress HEENT: normocephalic, thyroid without enlargement or mass HEART: RRR, no murmurs rubs/gallops RESP: clear and equal to auscultation bilaterally in all lobes  Breast Exam:  exam performed: right breast normal without mass, skin or nipple changes or axillary nodes, left breast normal without mass, skin or nipple changes or axillary nodes Abdomen: soft Neuro: alert and oriented x 3 Skin: warm, dry Psych: affect normal Pelvic exam: Performed: Cervix pink, visually closed, without lesion, scant white creamy discharge, vaginal walls and external genitalia normal Bimanual exam: anterior, neg CMT, uterus nontender, nonenlarged, adnexa without tenderness, enlargement, or mass        Assessment/Plan:  1. Encounter for well woman exam with routine gynecological exam (Primary) -Pap smear collected today -Declined STD testing  2. Cervical cancer screening -Pap collected today -Previous pap 2017- Normal  3. Attempted IUD removal, unsuccessful -IUD removal attempted with 3 passes using IUD hook -Strings not visualized on exam -Plan for U/S guided IUD removal in about a month  4. 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 Second  degree AV block, Mobitz type I -Counseled patient extensively on the risk of pregnancy with her current heart condition -Patient advised against pregnancy due to risks -Patient advised to use condoms for pregnancy prevention -Advised to discuss trying to conceive with Cardiologist    Return in about 4 weeks (around 06/25/2024) for IUD removal u/s guided.   Derrek JINNY Freund, NP Student 3:42 PM

## 2024-05-30 LAB — CYTOLOGY - PAP
Comment: NEGATIVE
Diagnosis: NEGATIVE
High risk HPV: NEGATIVE

## 2024-06-03 ENCOUNTER — Ambulatory Visit: Payer: Self-pay | Admitting: Certified Nurse Midwife

## 2024-06-04 ENCOUNTER — Ambulatory Visit (HOSPITAL_COMMUNITY)

## 2024-06-11 ENCOUNTER — Ambulatory Visit: Admitting: Family Medicine

## 2024-07-02 ENCOUNTER — Telehealth: Payer: Self-pay

## 2024-07-02 NOTE — Telephone Encounter (Signed)
 Copied from CRM #[id number]. Topic: Clinical - Medical Advice >> Jun 30, 2024  1:04 PM Nathanel BROCKS wrote: Reason for CRM: pt needs to know when was the last time she got a tb test and if she needs one can she get it this week? Please call pt and advise.

## 2024-07-03 NOTE — Telephone Encounter (Signed)
 Called pt to schedule appt for TB test; could not reach but left a vm to call the office back

## 2024-07-25 ENCOUNTER — Telehealth: Payer: Self-pay | Admitting: Family Medicine

## 2024-07-25 NOTE — Telephone Encounter (Signed)
 Copied from CRM #[id number]. Topic: Appointments - Scheduling Inquiry for Clinic >> Jul 25, 2024  9:01 AM Edsel HERO wrote: Patient called to schedule a TB test. Please follow up with patient to schedule.

## 2024-07-25 NOTE — Telephone Encounter (Signed)
 Pt is requesting TB shot. Are we in stock for next week? Please advise.

## 2024-07-25 NOTE — Telephone Encounter (Signed)
 Called pt and scheduled for Monday afternoon

## 2024-07-28 ENCOUNTER — Ambulatory Visit: Payer: Self-pay

## 2024-08-11 ENCOUNTER — Ambulatory Visit: Admitting: Cardiology
